# Patient Record
Sex: Female | Born: 1950
Health system: Southern US, Community
[De-identification: ages and names within clinical notes are randomized; demographics above are authoritative.]

## PROBLEM LIST (undated history)

## (undated) DIAGNOSIS — G8929 Other chronic pain: Secondary | ICD-10-CM

## (undated) DIAGNOSIS — R002 Palpitations: Secondary | ICD-10-CM

## (undated) DIAGNOSIS — M810 Age-related osteoporosis without current pathological fracture: Secondary | ICD-10-CM

## (undated) DIAGNOSIS — K5792 Diverticulitis of intestine, part unspecified, without perforation or abscess without bleeding: Secondary | ICD-10-CM

## (undated) DIAGNOSIS — I2510 Atherosclerotic heart disease of native coronary artery without angina pectoris: Secondary | ICD-10-CM

## (undated) DIAGNOSIS — M5412 Radiculopathy, cervical region: Secondary | ICD-10-CM

## (undated) DIAGNOSIS — I10 Essential (primary) hypertension: Secondary | ICD-10-CM

## (undated) DIAGNOSIS — M545 Low back pain, unspecified: Secondary | ICD-10-CM

## (undated) DIAGNOSIS — E785 Hyperlipidemia, unspecified: Secondary | ICD-10-CM

## (undated) DIAGNOSIS — E66812 Morbid (severe) obesity due to excess calories: Secondary | ICD-10-CM

## (undated) DIAGNOSIS — M1990 Unspecified osteoarthritis, unspecified site: Secondary | ICD-10-CM

## (undated) DIAGNOSIS — G44009 Cluster headache syndrome, unspecified, not intractable: Secondary | ICD-10-CM

## (undated) DIAGNOSIS — R519 Headache, unspecified: Secondary | ICD-10-CM

## (undated) HISTORY — PX: TONSILLECTOMY: SUR1361

## (undated) HISTORY — PX: COLOSTOMY: SHX63

## (undated) HISTORY — DX: Headache, unspecified: R51.9

## (undated) HISTORY — DX: Age-related osteoporosis without current pathological fracture: M81.0

## (undated) HISTORY — DX: Radiculopathy, cervical region: M54.12

## (undated) HISTORY — DX: Morbid (severe) obesity due to excess calories: E66.01

## (undated) HISTORY — DX: Morbid (severe) obesity due to excess calories: E66.812

## (undated) HISTORY — PX: OOPHORECTOMY: SHX86

## (undated) HISTORY — DX: Other chronic pain: G89.29

## (undated) HISTORY — DX: Low back pain, unspecified: M54.50

## (undated) HISTORY — PX: CARDIAC CATHETERIZATION: SHX172

## (undated) HISTORY — PX: BACK SURGERY: SHX140

## (undated) HISTORY — DX: Cluster headache syndrome, unspecified, not intractable: G44.009

## (undated) HISTORY — DX: Palpitations: R00.2

## (undated) HISTORY — PX: CORONARY ANGIOPLASTY: SHX604

## (undated) HISTORY — DX: Hyperlipidemia, unspecified: E78.5

## (undated) HISTORY — PX: COLOSTOMY CLOSURE: SHX1381

## (undated) HISTORY — DX: Essential (primary) hypertension: I10

---

## 1984-08-30 HISTORY — PX: CHOLECYSTECTOMY: SHX55

## 1988-08-30 HISTORY — PX: ABDOMINAL HYSTERECTOMY: SHX81

## 2005-04-02 ENCOUNTER — Encounter: Admission: RE | Admit: 2005-04-02 | Discharge: 2005-04-02 | Payer: Self-pay | Admitting: Family Medicine

## 2005-05-05 ENCOUNTER — Encounter: Admission: RE | Admit: 2005-05-05 | Discharge: 2005-05-05 | Payer: Self-pay | Admitting: Family Medicine

## 2005-05-13 ENCOUNTER — Encounter: Admission: RE | Admit: 2005-05-13 | Discharge: 2005-05-13 | Payer: Self-pay | Admitting: General Surgery

## 2005-10-05 ENCOUNTER — Inpatient Hospital Stay (HOSPITAL_COMMUNITY): Admission: RE | Admit: 2005-10-05 | Discharge: 2005-10-13 | Payer: Self-pay | Admitting: General Surgery

## 2006-02-07 ENCOUNTER — Ambulatory Visit (HOSPITAL_COMMUNITY): Admission: RE | Admit: 2006-02-07 | Discharge: 2006-02-07 | Payer: Self-pay | Admitting: Surgery

## 2006-02-09 ENCOUNTER — Inpatient Hospital Stay (HOSPITAL_COMMUNITY): Admission: AD | Admit: 2006-02-09 | Discharge: 2006-02-15 | Payer: Self-pay | Admitting: General Surgery

## 2010-08-28 ENCOUNTER — Ambulatory Visit: Payer: Self-pay | Admitting: Family Medicine

## 2011-03-30 ENCOUNTER — Ambulatory Visit: Payer: Self-pay | Admitting: Family Medicine

## 2011-05-07 ENCOUNTER — Ambulatory Visit: Payer: Self-pay | Admitting: Family Medicine

## 2011-07-26 ENCOUNTER — Other Ambulatory Visit: Payer: Self-pay | Admitting: Neurosurgery

## 2011-08-26 ENCOUNTER — Encounter (HOSPITAL_COMMUNITY): Payer: Self-pay | Admitting: Pharmacy Technician

## 2011-09-03 ENCOUNTER — Encounter (HOSPITAL_COMMUNITY)
Admission: RE | Admit: 2011-09-03 | Discharge: 2011-09-03 | Disposition: A | Discharge status: Home / Self Care | Payer: PRIVATE HEALTH INSURANCE | Source: Ambulatory Visit | Attending: Neurosurgery | Admitting: Neurosurgery

## 2011-09-03 ENCOUNTER — Encounter (HOSPITAL_COMMUNITY): Payer: Self-pay

## 2011-09-03 HISTORY — DX: Unspecified osteoarthritis, unspecified site: M19.90

## 2011-09-03 HISTORY — DX: Diverticulitis of intestine, part unspecified, without perforation or abscess without bleeding: K57.92

## 2011-09-03 LAB — CBC
HCT: 40.9 % (ref 36.0–46.0)
Hemoglobin: 13.8 g/dL (ref 12.0–15.0)
MCH: 30.6 pg (ref 26.0–34.0)
MCHC: 33.7 g/dL (ref 30.0–36.0)
MCV: 90.7 fL (ref 78.0–100.0)
Platelets: 296 10*3/uL (ref 150–400)
RBC: 4.51 MIL/uL (ref 3.87–5.11)
RDW: 13.5 % (ref 11.5–15.5)
WBC: 7.5 10*3/uL (ref 4.0–10.5)

## 2011-09-03 LAB — TYPE AND SCREEN
ABO/RH(D): A POS
Antibody Screen: NEGATIVE

## 2011-09-03 LAB — SURGICAL PCR SCREEN
MRSA, PCR: NEGATIVE
Staphylococcus aureus: NEGATIVE

## 2011-09-03 NOTE — Pre-Procedure Instructions (Signed)
20 Barbara Fletcher  09/03/2011   Your procedure is scheduled on:  Jan. 10, 2013  Report to [facility] at [phone].  Call this number if you have problems the morning of surgery: [phone]   Remember:    Do not eat food:After Midnight.  May have clear liquids: up to 4 Hours before arrival.  Clear liquids include soda, tea, black coffee, apple or grape juice, broth.  Take these medicines the morning of surgery with A SIP OF WATER: none   Do not wear jewelry, make-up or nail polish.  Do not wear lotions, powders, or perfumes. You may wear deodorant.  Do not shave 48 hours prior to surgery.  Do not bring valuables to the hospital.  Contacts, dentures or bridgework may not be worn into surgery.  Leave suitcase in the car. After surgery it may be brought to your room.  For patients admitted to the hospital, checkout time is 11:00 AM the day of discharge.   Patients discharged the day of surgery will not be allowed to drive home.  Name and phone number of your driver: NA   Special Instructions: CHG Shower Use Special Wash: 1/2 bottle night before surgery and 1/2 bottle morning of surgery.   Please read over the following fact sheets that you were given: Pain Booklet, Blood Transfusion Information and Surgical Site Infection Prevention

## 2011-09-08 MED ORDER — CEFAZOLIN SODIUM-DEXTROSE 2-3 GM-% IV SOLR
2.0000 g | INTRAVENOUS | Status: AC
  Administered 2011-09-09: 2 g via INTRAVENOUS
  Filled 2011-09-08: qty 50

## 2011-09-09 ENCOUNTER — Inpatient Hospital Stay (HOSPITAL_COMMUNITY)
Admission: RE | Admit: 2011-09-09 | Discharge: 2011-09-13 | DRG: 460 | Disposition: A | Discharge status: Home / Self Care | Payer: PRIVATE HEALTH INSURANCE | Source: Ambulatory Visit | Attending: Neurosurgery | Admitting: Neurosurgery

## 2011-09-09 ENCOUNTER — Encounter (HOSPITAL_COMMUNITY): Payer: Self-pay | Admitting: Critical Care Medicine

## 2011-09-09 ENCOUNTER — Ambulatory Visit (HOSPITAL_COMMUNITY): Payer: PRIVATE HEALTH INSURANCE

## 2011-09-09 ENCOUNTER — Encounter (HOSPITAL_COMMUNITY)
Admission: RE | Disposition: A | Discharge status: Home / Self Care | Payer: Self-pay | Source: Ambulatory Visit | Attending: Neurosurgery

## 2011-09-09 ENCOUNTER — Ambulatory Visit (HOSPITAL_COMMUNITY): Payer: PRIVATE HEALTH INSURANCE | Admitting: Critical Care Medicine

## 2011-09-09 ENCOUNTER — Encounter (HOSPITAL_COMMUNITY): Payer: Self-pay | Admitting: Surgery

## 2011-09-09 DIAGNOSIS — M5137 Other intervertebral disc degeneration, lumbosacral region: Principal | ICD-10-CM | POA: Diagnosis present

## 2011-09-09 DIAGNOSIS — Z79899 Other long term (current) drug therapy: Secondary | ICD-10-CM

## 2011-09-09 DIAGNOSIS — Z23 Encounter for immunization: Secondary | ICD-10-CM

## 2011-09-09 DIAGNOSIS — M51379 Other intervertebral disc degeneration, lumbosacral region without mention of lumbar back pain or lower extremity pain: Principal | ICD-10-CM | POA: Diagnosis present

## 2011-09-09 DIAGNOSIS — M4316 Spondylolisthesis, lumbar region: Secondary | ICD-10-CM

## 2011-09-09 DIAGNOSIS — Q762 Congenital spondylolisthesis: Secondary | ICD-10-CM

## 2011-09-09 DIAGNOSIS — Z01812 Encounter for preprocedural laboratory examination: Secondary | ICD-10-CM

## 2011-09-09 DIAGNOSIS — M48062 Spinal stenosis, lumbar region with neurogenic claudication: Secondary | ICD-10-CM | POA: Diagnosis present

## 2011-09-09 SURGERY — POSTERIOR LUMBAR FUSION 2 LEVEL
Anesthesia: General | Site: Spine Lumbar | Wound class: Clean

## 2011-09-09 MED ORDER — SODIUM CHLORIDE 0.9 % IJ SOLN: 9.0000 mL | INTRAMUSCULAR | Status: DC | PRN

## 2011-09-09 MED ORDER — EPHEDRINE SULFATE 50 MG/ML IJ SOLN
INTRAMUSCULAR | Status: DC | PRN
  Administered 2011-09-09: 10 mg via INTRAVENOUS

## 2011-09-09 MED ORDER — PROPOFOL 10 MG/ML IV EMUL
INTRAVENOUS | Status: DC | PRN
  Administered 2011-09-09: 180 mg via INTRAVENOUS

## 2011-09-09 MED ORDER — NEOSTIGMINE METHYLSULFATE 1 MG/ML IJ SOLN
INTRAMUSCULAR | Status: DC | PRN
  Administered 2011-09-09: 4 mg via INTRAVENOUS

## 2011-09-09 MED ORDER — MORPHINE SULFATE (PF) 1 MG/ML IV SOLN
INTRAVENOUS | Status: DC
  Administered 2011-09-09 – 2011-09-10 (×2): via INTRAVENOUS
  Administered 2011-09-10: 10.07 mg via INTRAVENOUS
  Administered 2011-09-10: 12 mg via INTRAVENOUS
  Administered 2011-09-10: 7.5 mg via INTRAVENOUS
  Administered 2011-09-10: 14.37 mg via INTRAVENOUS
  Administered 2011-09-10: 13.5 mg via INTRAVENOUS
  Administered 2011-09-10: 16.5 mg via INTRAVENOUS
  Administered 2011-09-11: 8.26 mg via INTRAVENOUS
  Administered 2011-09-11: 19.5 mg via INTRAVENOUS
  Administered 2011-09-11: 02:00:00 via INTRAVENOUS
  Filled 2011-09-09 (×5): qty 25

## 2011-09-09 MED ORDER — MENTHOL 3 MG MT LOZG: 1.0000 | LOZENGE | OROMUCOSAL | Status: DC | PRN

## 2011-09-09 MED ORDER — ONDANSETRON HCL 4 MG/2ML IJ SOLN: 4.0000 mg | Freq: Four times a day (QID) | INTRAMUSCULAR | Status: DC | PRN

## 2011-09-09 MED ORDER — HYDROMORPHONE HCL PF 1 MG/ML IJ SOLN
0.2500 mg | INTRAMUSCULAR | Status: DC | PRN
  Administered 2011-09-09 (×2): 0.5 mg via INTRAVENOUS

## 2011-09-09 MED ORDER — GLYCOPYRROLATE 0.2 MG/ML IJ SOLN
INTRAMUSCULAR | Status: DC | PRN
  Administered 2011-09-09: .6 mg via INTRAVENOUS

## 2011-09-09 MED ORDER — HYDROCODONE-ACETAMINOPHEN 5-325 MG PO TABS
ORAL_TABLET | ORAL | Status: AC
  Filled 2011-09-09: qty 2

## 2011-09-09 MED ORDER — THROMBIN 20000 UNITS EX KIT
PACK | CUTANEOUS | Status: DC | PRN
  Administered 2011-09-09 (×2): 20000 [IU] via TOPICAL

## 2011-09-09 MED ORDER — LACTATED RINGERS IV SOLN: INTRAVENOUS | Status: DC

## 2011-09-09 MED ORDER — DOCUSATE SODIUM 100 MG PO CAPS
100.0000 mg | ORAL_CAPSULE | Freq: Two times a day (BID) | ORAL | Status: DC
  Administered 2011-09-09 – 2011-09-12 (×7): 100 mg via ORAL
  Filled 2011-09-09 (×5): qty 1

## 2011-09-09 MED ORDER — HETASTARCH-ELECTROLYTES 6 % IV SOLN
INTRAVENOUS | Status: DC | PRN
  Administered 2011-09-09: 14:00:00 via INTRAVENOUS

## 2011-09-09 MED ORDER — PHENOL 1.4 % MT LIQD: 1.0000 | OROMUCOSAL | Status: DC | PRN

## 2011-09-09 MED ORDER — OXYCODONE-ACETAMINOPHEN 5-325 MG PO TABS
1.0000 | ORAL_TABLET | ORAL | Status: DC | PRN
  Administered 2011-09-11 – 2011-09-13 (×11): 2 via ORAL
  Filled 2011-09-09 (×11): qty 2

## 2011-09-09 MED ORDER — PHENYLEPHRINE HCL 10 MG/ML IJ SOLN
10.0000 mg | INTRAVENOUS | Status: DC | PRN
  Administered 2011-09-09: 10 ug/min via INTRAVENOUS

## 2011-09-09 MED ORDER — SODIUM CHLORIDE 0.9 % IR SOLN
Status: DC | PRN
  Administered 2011-09-09: 14:00:00

## 2011-09-09 MED ORDER — LORATADINE 10 MG PO TABS
10.0000 mg | ORAL_TABLET | Freq: Every day | ORAL | Status: DC
  Administered 2011-09-11: 10 mg via ORAL
  Filled 2011-09-09 (×4): qty 1

## 2011-09-09 MED ORDER — DIAZEPAM 5 MG PO TABS
ORAL_TABLET | ORAL | Status: AC
  Filled 2011-09-09: qty 1

## 2011-09-09 MED ORDER — BACITRACIN ZINC 500 UNIT/GM EX OINT
TOPICAL_OINTMENT | CUTANEOUS | Status: DC | PRN
  Administered 2011-09-09: 1 via TOPICAL

## 2011-09-09 MED ORDER — DEXAMETHASONE SODIUM PHOSPHATE 4 MG/ML IJ SOLN
INTRAMUSCULAR | Status: DC | PRN
  Administered 2011-09-09: 4 mg via INTRAVENOUS

## 2011-09-09 MED ORDER — LACTATED RINGERS IV SOLN
INTRAVENOUS | Status: DC
  Administered 2011-09-09: 22:00:00 via INTRAVENOUS
  Administered 2011-09-10: 75 mL/h via INTRAVENOUS

## 2011-09-09 MED ORDER — DIPHENHYDRAMINE HCL 12.5 MG/5ML PO ELIX: 12.5000 mg | ORAL_SOLUTION | Freq: Four times a day (QID) | ORAL | Status: DC | PRN

## 2011-09-09 MED ORDER — LACTATED RINGERS IV SOLN
INTRAVENOUS | Status: DC | PRN
  Administered 2011-09-09 (×4): via INTRAVENOUS

## 2011-09-09 MED ORDER — ONDANSETRON HCL 4 MG/2ML IJ SOLN
INTRAMUSCULAR | Status: DC | PRN
  Administered 2011-09-09 (×2): 4 mg via INTRAVENOUS

## 2011-09-09 MED ORDER — BACITRACIN 50000 UNITS IM SOLR
INTRAMUSCULAR | Status: AC
  Filled 2011-09-09: qty 50000

## 2011-09-09 MED ORDER — HEMOSTATIC AGENTS (NO CHARGE) OPTIME
TOPICAL | Status: DC | PRN
  Administered 2011-09-09 (×2): 1 via TOPICAL

## 2011-09-09 MED ORDER — BUPIVACAINE LIPOSOME 1.3 % IJ SUSP
20.0000 mL | INTRAMUSCULAR | Status: AC
  Administered 2011-09-09: 20 mL
  Filled 2011-09-09: qty 20

## 2011-09-09 MED ORDER — ZOLPIDEM TARTRATE 10 MG PO TABS: 10.0000 mg | ORAL_TABLET | Freq: Every evening | ORAL | Status: DC | PRN

## 2011-09-09 MED ORDER — CEFAZOLIN SODIUM 1-5 GM-% IV SOLN
1.0000 g | Freq: Three times a day (TID) | INTRAVENOUS | Status: AC
  Administered 2011-09-09 – 2011-09-10 (×2): 1 g via INTRAVENOUS
  Filled 2011-09-09 (×3): qty 50

## 2011-09-09 MED ORDER — MIDAZOLAM HCL 5 MG/5ML IJ SOLN
INTRAMUSCULAR | Status: DC | PRN
  Administered 2011-09-09: 2 mg via INTRAVENOUS

## 2011-09-09 MED ORDER — ACETAMINOPHEN 325 MG PO TABS: 650.0000 mg | ORAL_TABLET | ORAL | Status: DC | PRN

## 2011-09-09 MED ORDER — VECURONIUM BROMIDE 10 MG IV SOLR
INTRAVENOUS | Status: DC | PRN
  Administered 2011-09-09 (×3): 2 mg via INTRAVENOUS
  Administered 2011-09-09: 3 mg via INTRAVENOUS

## 2011-09-09 MED ORDER — NALOXONE HCL 0.4 MG/ML IJ SOLN: 0.4000 mg | INTRAMUSCULAR | Status: DC | PRN

## 2011-09-09 MED ORDER — PROMETHAZINE HCL 25 MG/ML IJ SOLN: 6.2500 mg | INTRAMUSCULAR | Status: DC | PRN

## 2011-09-09 MED ORDER — ONDANSETRON HCL 4 MG/2ML IJ SOLN: 4.0000 mg | INTRAMUSCULAR | Status: DC | PRN

## 2011-09-09 MED ORDER — ACETAMINOPHEN 650 MG RE SUPP: 650.0000 mg | RECTAL | Status: DC | PRN

## 2011-09-09 MED ORDER — DIPHENHYDRAMINE HCL 50 MG/ML IJ SOLN: 12.5000 mg | Freq: Four times a day (QID) | INTRAMUSCULAR | Status: DC | PRN

## 2011-09-09 MED ORDER — HYDROCODONE-ACETAMINOPHEN 5-325 MG PO TABS
1.0000 | ORAL_TABLET | ORAL | Status: DC | PRN
  Administered 2011-09-09: 2 via ORAL

## 2011-09-09 MED ORDER — SODIUM CHLORIDE 0.9 % IV SOLN
INTRAVENOUS | Status: AC
  Filled 2011-09-09: qty 500

## 2011-09-09 MED ORDER — ROCURONIUM BROMIDE 100 MG/10ML IV SOLN
INTRAVENOUS | Status: DC | PRN
Start: 2011-09-09 — End: 2011-09-09
  Administered 2011-09-09: 50 mg via INTRAVENOUS

## 2011-09-09 MED ORDER — BUPIVACAINE-EPINEPHRINE PF 0.5-1:200000 % IJ SOLN
INTRAMUSCULAR | Status: DC | PRN
  Administered 2011-09-09: 10 mL

## 2011-09-09 MED ORDER — HYDROMORPHONE HCL PF 1 MG/ML IJ SOLN
INTRAMUSCULAR | Status: AC
  Filled 2011-09-09: qty 1

## 2011-09-09 MED ORDER — DIAZEPAM 5 MG PO TABS
5.0000 mg | ORAL_TABLET | Freq: Four times a day (QID) | ORAL | Status: DC | PRN
  Administered 2011-09-09: 5 mg via ORAL

## 2011-09-09 MED ORDER — 0.9 % SODIUM CHLORIDE (POUR BTL) OPTIME
TOPICAL | Status: DC | PRN
  Administered 2011-09-09: 1000 mL

## 2011-09-09 MED ORDER — FENTANYL CITRATE 0.05 MG/ML IJ SOLN
INTRAMUSCULAR | Status: DC | PRN
  Administered 2011-09-09 (×3): 50 ug via INTRAVENOUS
  Administered 2011-09-09: 25 ug via INTRAVENOUS
  Administered 2011-09-09: 150 ug via INTRAVENOUS
  Administered 2011-09-09: 50 ug via INTRAVENOUS
  Administered 2011-09-09: 100 ug via INTRAVENOUS
  Administered 2011-09-09: 50 ug via INTRAVENOUS

## 2011-09-09 MED ORDER — ONDANSETRON HCL 4 MG/2ML IJ SOLN
4.0000 mg | INTRAMUSCULAR | Status: DC | PRN
  Administered 2011-09-09: 4 mg via INTRAVENOUS
  Filled 2011-09-09: qty 2

## 2011-09-09 SURGICAL SUPPLY — 76 items
BAG DECANTER FOR FLEXI CONT (MISCELLANEOUS) ×2 IMPLANT
BENZOIN TINCTURE PRP APPL 2/3 (GAUZE/BANDAGES/DRESSINGS) IMPLANT
BLADE SURG 15 STRL LF DISP TIS (BLADE) ×1 IMPLANT
BLADE SURG 15 STRL SS (BLADE) ×1
BLADE SURG ROTATE 9660 (MISCELLANEOUS) IMPLANT
BRUSH SCRUB EZ PLAIN DRY (MISCELLANEOUS) ×2 IMPLANT
BUR ACORN 6.0 (BURR) ×2 IMPLANT
BUR MATCHSTICK NEURO 3.0 LAGG (BURR) ×2 IMPLANT
CANISTER SUCTION 2500CC (MISCELLANEOUS) ×2 IMPLANT
CLOSURE STERI STRIP 1/2 X4 (GAUZE/BANDAGES/DRESSINGS) ×2 IMPLANT
CLOTH BEACON ORANGE TIMEOUT ST (SAFETY) ×2 IMPLANT
CONN CROSSLINK REV 6.35 48-60 (Connector) ×2 IMPLANT
CONNECTOR CRSLNK REV6.35 48-60 (Connector) ×1 IMPLANT
CONT SPEC 4OZ CLIKSEAL STRL BL (MISCELLANEOUS) ×4 IMPLANT
COVER BACK TABLE 24X17X13 BIG (DRAPES) IMPLANT
DRAPE C-ARM 42X72 X-RAY (DRAPES) ×4 IMPLANT
DRAPE LAPAROTOMY 100X72X124 (DRAPES) ×2 IMPLANT
DRAPE POUCH INSTRU U-SHP 10X18 (DRAPES) ×2 IMPLANT
DRAPE SURG 17X23 STRL (DRAPES) ×8 IMPLANT
ELECT BLADE 4.0 EZ CLEAN MEGAD (MISCELLANEOUS) ×2
ELECT REM PT RETURN 9FT ADLT (ELECTROSURGICAL) ×2
ELECTRODE BLDE 4.0 EZ CLN MEGD (MISCELLANEOUS) ×1 IMPLANT
ELECTRODE REM PT RTRN 9FT ADLT (ELECTROSURGICAL) ×1 IMPLANT
GAUZE SPONGE 4X4 16PLY XRAY LF (GAUZE/BANDAGES/DRESSINGS) ×2 IMPLANT
GLOVE BIO SURGEON STRL SZ 6.5 (GLOVE) ×6 IMPLANT
GLOVE BIO SURGEON STRL SZ8.5 (GLOVE) ×4 IMPLANT
GLOVE BIOGEL PI IND STRL 6.5 (GLOVE) ×1 IMPLANT
GLOVE BIOGEL PI IND STRL 7.0 (GLOVE) ×1 IMPLANT
GLOVE BIOGEL PI IND STRL 7.5 (GLOVE) ×1 IMPLANT
GLOVE BIOGEL PI INDICATOR 6.5 (GLOVE) ×1
GLOVE BIOGEL PI INDICATOR 7.0 (GLOVE) ×1
GLOVE BIOGEL PI INDICATOR 7.5 (GLOVE) ×1
GLOVE ECLIPSE 7.5 STRL STRAW (GLOVE) ×2 IMPLANT
GLOVE EXAM NITRILE LRG STRL (GLOVE) IMPLANT
GLOVE EXAM NITRILE MD LF STRL (GLOVE) ×4 IMPLANT
GLOVE EXAM NITRILE XL STR (GLOVE) IMPLANT
GLOVE EXAM NITRILE XS STR PU (GLOVE) IMPLANT
GLOVE SS BIOGEL STRL SZ 8 (GLOVE) ×2 IMPLANT
GLOVE SUPERSENSE BIOGEL SZ 8 (GLOVE) ×2
GLOVE SURG SS PI 6.5 STRL IVOR (GLOVE) ×4 IMPLANT
GOWN BRE IMP SLV AUR LG STRL (GOWN DISPOSABLE) ×4 IMPLANT
GOWN BRE IMP SLV AUR XL STRL (GOWN DISPOSABLE) ×4 IMPLANT
GOWN STRL REIN 2XL LVL4 (GOWN DISPOSABLE) IMPLANT
Globus Revere Locking Cap (Cap) ×12 IMPLANT
KIT BASIN OR (CUSTOM PROCEDURE TRAY) ×2 IMPLANT
KIT ROOM TURNOVER OR (KITS) ×2 IMPLANT
NEEDLE HYPO 21X1.5 SAFETY (NEEDLE) ×2 IMPLANT
NEEDLE HYPO 22GX1.5 SAFETY (NEEDLE) ×2 IMPLANT
NS IRRIG 1000ML POUR BTL (IV SOLUTION) ×2 IMPLANT
PACK FOAM VITOSS 10CC (Orthopedic Implant) ×2 IMPLANT
PACK LAMINECTOMY NEURO (CUSTOM PROCEDURE TRAY) ×2 IMPLANT
PAD ARMBOARD 7.5X6 YLW CONV (MISCELLANEOUS) ×10 IMPLANT
PATTIES SURGICAL .5 X.5 (GAUZE/BANDAGES/DRESSINGS) ×2 IMPLANT
PATTIES SURGICAL .5 X1 (DISPOSABLE) IMPLANT
PATTIES SURGICAL 1X1 (DISPOSABLE) ×2 IMPLANT
PUTTY ABX ACTIFUSE 20ML (Putty) ×2 IMPLANT
ROD REVERE 7.5MM (Rod) ×4 IMPLANT
SCREW REVERE 6.5X50MM (Screw) ×12 IMPLANT
SPACER SUSTAIN O SML 8X22 11MM (Peek) ×4 IMPLANT
SPACER SUSTAIN O SML 8X22 9MM (Peek) ×4 IMPLANT
SPONGE GAUZE 4X4 12PLY (GAUZE/BANDAGES/DRESSINGS) IMPLANT
SPONGE LAP 4X18 X RAY DECT (DISPOSABLE) ×2 IMPLANT
SPONGE NEURO XRAY DETECT 1X3 (DISPOSABLE) IMPLANT
SPONGE SURGIFOAM ABS GEL 100 (HEMOSTASIS) ×2 IMPLANT
STRIP CLOSURE SKIN 1/2X4 (GAUZE/BANDAGES/DRESSINGS) IMPLANT
SUT PROLENE 6 0 BV (SUTURE) ×2 IMPLANT
SUT VIC AB 1 CT1 18XBRD ANBCTR (SUTURE) ×2 IMPLANT
SUT VIC AB 1 CT1 8-18 (SUTURE) ×2
SUT VIC AB 2-0 CP2 18 (SUTURE) ×4 IMPLANT
SYR 20CC LL (SYRINGE) ×2 IMPLANT
SYR 20ML ECCENTRIC (SYRINGE) ×2 IMPLANT
TAPE CLOTH SURG 4X10 WHT LF (GAUZE/BANDAGES/DRESSINGS) ×6 IMPLANT
TOWEL OR 17X24 6PK STRL BLUE (TOWEL DISPOSABLE) ×2 IMPLANT
TOWEL OR 17X26 10 PK STRL BLUE (TOWEL DISPOSABLE) ×2 IMPLANT
TRAY FOLEY CATH 14FRSI W/METER (CATHETERS) ×2 IMPLANT
WATER STERILE IRR 1000ML POUR (IV SOLUTION) ×2 IMPLANT

## 2011-09-09 NOTE — Anesthesia Procedure Notes (Signed)
Procedure Name: Intubation Date/Time: 09/09/2011 12:27 PM Performed by: Lanell Matar Pre-anesthesia Checklist: Patient identified, Timeout performed, Emergency Drugs available, Suction available and Patient being monitored Patient Re-evaluated:Patient Re-evaluated prior to inductionOxygen Delivery Method: Circle System Utilized Preoxygenation: Pre-oxygenation with 100% oxygen Intubation Type: IV induction and Cricoid Pressure applied Ventilation: Mask ventilation without difficulty Laryngoscope Size: Mac and 3 Grade View: Grade I Tube type: Oral Tube size: 7.0 mm Number of attempts: 1 Airway Equipment and Method: stylet Placement Confirmation: ETT inserted through vocal cords under direct vision,  breath sounds checked- equal and bilateral and positive ETCO2 Secured at: 21 cm Tube secured with: Tape Dental Injury: Teeth and Oropharynx as per pre-operative assessment

## 2011-09-09 NOTE — Anesthesia Postprocedure Evaluation (Signed)
Anesthesia Post Note  Patient: Barbara Fletcher  Procedure(s) Performed:  POSTERIOR LUMBAR FUSION 2 LEVEL - Lumbar three-four,Lumbar four-five laminectomy with posterior lumbar interbody fusion with interbody prothesis posterolateral arthrodesis and posterior segmental instrumentation  Anesthesia type: General  Patient location: PACU  Post pain: Pain level controlled and Adequate analgesia  Post assessment: Post-op Vital signs reviewed, Patient's Cardiovascular Status Stable, Respiratory Function Stable, Patent Airway and Pain level controlled  Last Vitals:  Filed Vitals:   09/09/11 0959  BP: 147/81  Pulse: 82  Temp: 36.6 C  Resp: 18    Post vital signs: Reviewed and stable  Level of consciousness: awake, alert  and oriented  Complications: No apparent anesthesia complications

## 2011-09-09 NOTE — OR Nursing (Signed)
Spoke with pharmacy/ [id number]/ pca not ready d/t delays/ pain is easily managed at presnt with po's and injectables as pt states is now 3-4/10

## 2011-09-09 NOTE — Preoperative (Signed)
Beta Blockers   Reason not to administer Beta Blockers:Not Applicable 

## 2011-09-09 NOTE — H&P (Signed)
Subjective: Patient is a 61 year old white female who has had years of chronic back buttocks and leg pain. He has failed medical management . She was worked up with a lumbar MRI which demonstrated she had a spondylolisthesis and spinal stenosis at L3-4 and L4-5. Discussed the various treatment options with the patient including surgery. Patient has decided proceed with surgery.  Past Medical History  Diagnosis Date  . Arthritis   . Diverticulitis     Past Surgical History  Procedure Date  . Tonsillectomy     1958  . Oophorectomy     1981  . Abdominal hysterectomy 1990  . Cesarean section 1985, 1988  . Cholecystectomy 1986  . Colostomy   . Colostomy closure     abd diverticulitis with colostomy, reversal 2007- 2008    Allergies  Allergen Reactions  . Levsin (Hyoscyamine Sulfate) Hives and Shortness Of Breath    History  Substance Use Topics  . Smoking status: Never Smoker   . Smokeless tobacco: Not on file  . Alcohol Use: 0.6 oz/week    1 Glasses of wine per week    History reviewed. No pertinent family history. Prior to Admission medications   Medication Sig Start Date End Date Taking? Authorizing Provider  fexofenadine-pseudoephedrine (ALLEGRA-D 24) 180-240 MG per 24 hr tablet Take 1 tablet by mouth daily as needed. Seasonal allergies    Yes Historical Provider, MD  ibuprofen (ADVIL,MOTRIN) 200 MG tablet Take 800 mg by mouth 2 (two) times daily.     Yes Historical Provider, MD     Review of Systems  Positive ROS: Negative except as above  All other systems have been reviewed and were otherwise negative with the exception of those mentioned in the HPI and as above.  Objective: Vital signs in last 24 hours: Temp:  [97.9 F (36.6 C)] 97.9 F (36.6 C) (01/10 0959) Pulse Rate:  [82] 82  (01/10 0959) Resp:  [18] 18  (01/10 0959) BP: (147)/(81) 147/81 mmHg (01/10 0959) SpO2:  [99 %] 99 % (01/10 0959)  General Appearance: Alert, cooperative, no distress, appears  stated age Head: Normocephalic, without obvious abnormality, atraumatic Eyes: PERRL, conjunctiva/corneas clear, EOM's intact, fundi benign, both eyes      Ears: Normal TM's and external ear canals, both ears Throat: Lips, mucosa, and tongue normal; teeth and gums normal Neck: Supple, symmetrical, trachea midline, no adenopathy; thyroid: No enlargement/tenderness/nodules; no carotid bruit or JVD Back: Symmetric, no curvature, ROM normal, no CVA tenderness Lungs: Clear to auscultation bilaterally, respirations unlabored Heart: Regular rate and rhythm, S1 and S2 normal, no murmur, rub or gallop Abdomen: Soft, non-tender, bowel sounds active all four quadrants, no masses, no organomegaly Extremities: Extremities normal, atraumatic, no cyanosis or edema Pulses: 2+ and symmetric all extremities Skin: Skin color, texture, turgor normal, no rashes or lesions  NEUROLOGIC:   Mental status: alert and oriented, no aphasia, good attention span, Fund of knowledge/ memory ok Motor Exam - grossly normal Sensory Exam - grossly normal Reflexes:  Coordination - grossly normal Gait - grossly normal Balance - grossly normal Cranial Nerves: I: smell Not tested  II: visual acuity  OS: Normal    OD: Normal   II: visual fields Full to confrontation  II: pupils Equal, round, reactive to light  III,VII: ptosis None  III,IV,VI: extraocular muscles  Full ROM  V: mastication Normal  V: facial light touch sensation  Normal  V,VII: corneal reflex  Present  VII: facial muscle function - upper  Normal  VII: facial  muscle function - lower Normal  VIII: hearing Not tested  IX: soft palate elevation  Normal  IX,X: gag reflex Present  XI: trapezius strength  5/5  XI: sternocleidomastoid strength 5/5  XI: neck flexion strength  5/5  XII: tongue strength  Normal    Data Review Lab Results  Component Value Date   WBC 7.5 09/03/2011   HGB 13.8 09/03/2011   HCT 40.9 09/03/2011   MCV 90.7 09/03/2011   PLT 296  09/03/2011   No results found for this basename: NA, K, CL, CO2, BUN, creatinine, glucose   No results found for this basename: INR, PROTIME   Imaging studies: I reviewed the patient's lumbar MRI performed at [facility] on 05/07/2011. It demonstrates the patient has spondylolisthesis and spinal stenosis at L3-4 and L4-5 as well some diffuse degenerative changes.  Assessment/Plan: L3-4 and L4-5 spinal listhesis, degenerative disease, spinal stenosis, lumbago, lumbar radiculopathy, neurogenic claudication: I discussed the situation with the patient and her husband. I reviewed the MR scan with them. I've pointed out the abnormalities. Have discussed the various treatment options including surgery. I described the surgical option of an L3-4 and L4-5 decompression, instrumentation and fusion. I've shown her surgical models. I discussed the risks, benefits, alternatives and likelihood of achieving our goals with surgery. The patient has decided proceed with surgery.   Jarelis Ehlert D 09/09/2011 12:01 PM

## 2011-09-09 NOTE — Progress Notes (Signed)
Subjective:  A she is mildly somnolent but easily arousable. She looks well and appears comfortable.  Objective: Vital signs in last 24 hours: Temp:  [97.9 F (36.6 C)] 97.9 F (36.6 C) (01/10 0959) Pulse Rate:  [82] 82  (01/10 0959) Resp:  [18] 18  (01/10 0959) BP: (147)/(81) 147/81 mmHg (01/10 0959) SpO2:  [99 %] 99 % (01/10 0959)  Intake/Output from previous day:   Intake/Output this shift: Total I/O In: 3680 [I.V.:3000; Blood:180; IV Piggyback:500] Out: 1025 [Urine:425; Blood:600]  Physical exam patient is Glasgow Coma Scale 13. She is moving all 4 extremities well. Her dressing is clean and dry.  Lab Results: No results found for this basename: WBC:2,HGB:2,HCT:2,PLT:2 in the last 72 hours BMET No results found for this basename: NA:2,K:2,CL:2,CO2:2,GLUCOSE:2,BUN:2,CREATININE:2,CALCIUM:2 in the last 72 hours  Studies/Results: Dg Lumbar Spine 2-3 Views  09/09/2011  *RADIOLOGY REPORT*  Clinical Data: Back pain  LUMBAR SPINE - 2-3 VIEW  Comparison: Intraoperative films.  Findings: C-arm films document L3-L5 PLIF.  No gross adverse features.  IMPRESSION: As above.  Original Report Authenticated By: Elsie Stain, M.D.   Dg Lumbar Spine 1 View  09/09/2011  *RADIOLOGY REPORT*  Clinical Data: L3-5 PLIF.  LUMBAR SPINE - 1 VIEW  Comparison: None.  Findings: A single intraoperative cross-table lateral view of the lumbar spine, taken at 1320 hours, is submitted.  Surgical instrument tip projects in the region of the L3-4 facet joints, where there is grade 1 anterolisthesis.  Osseous detail is degraded by technique.  IMPRESSION: Intraoperative visualization for L3-5 PLIF.  Original Report Authenticated By: Reyes Ivan, M.D.    Assessment/Plan: Patient is doing well.  LOS: 0 days     Tyiesha Brackney D 09/09/2011, 6:02 PM

## 2011-09-09 NOTE — Transfer of Care (Signed)
Immediate Anesthesia Transfer of Care Note  Patient: Barbara Fletcher  Procedure(s) Performed:  POSTERIOR LUMBAR FUSION 2 LEVEL - Lumbar three-four,Lumbar four-five laminectomy with posterior lumbar interbody fusion with interbody prothesis posterolateral arthrodesis and posterior segmental instrumentation  Patient Location: PACU  Anesthesia Type: General  Level of Consciousness: awake, alert  and patient cooperative  Airway & Oxygen Therapy: Patient Spontanous Breathing and Patient connected to nasal cannula oxygen  Post-op Assessment: Report given to PACU RN, Post -op Vital signs reviewed and stable and Patient moving all extremities X 4  Post vital signs: Reviewed and stable Filed Vitals:   09/09/11 0959  BP: 147/81  Pulse: 82  Temp: 36.6 C  Resp: 18    Complications: No apparent anesthesia complications

## 2011-09-09 NOTE — Anesthesia Preprocedure Evaluation (Signed)
Anesthesia Evaluation  Patient identified by MRN, date of birth, ID band Patient awake    Reviewed: Allergy & Precautions, H&P , NPO status , Patient's Chart, lab work & pertinent test results  Airway Mallampati: II TM Distance: >3 FB Neck ROM: Full    Dental No notable dental hx. (+) Teeth Intact and Dental Advisory Given   Pulmonary neg pulmonary ROS,  clear to auscultation  Pulmonary exam normal       Cardiovascular neg cardio ROS Regular Normal    Neuro/Psych Chronic back pain- LLeg numbness Negative Neurological ROS     GI/Hepatic negative GI ROS, Neg liver ROS,   Endo/Other  Negative Endocrine ROSMorbid obesity  Renal/GU negative Renal ROS     Musculoskeletal   Abdominal (+) obese,   Peds  Hematology   Anesthesia Other Findings   Reproductive/Obstetrics                           Anesthesia Physical Anesthesia Plan  ASA: II  Anesthesia Plan: General   Post-op Pain Management:    Induction: Intravenous  Airway Management Planned: Oral ETT  Additional Equipment:   Intra-op Plan:   Post-operative Plan: Extubation in OR  Informed Consent: I have reviewed the patients History and Physical, chart, labs and discussed the procedure including the risks, benefits and alternatives for the proposed anesthesia with the patient or authorized representative who has indicated his/her understanding and acceptance.   Dental advisory given  Plan Discussed with: CRNA and Surgeon  Anesthesia Plan Comments: (Plan routine monitors, GETA)        Anesthesia Quick Evaluation

## 2011-09-09 NOTE — Op Note (Signed)
Brief history: She is a 61 year old white female who has suffered from back buttocks and leg pain consistent with neurogenic claudication. She has failed medical management and was worked up with a lumbar MRI. This demonstrated the patient has spondylolisthesis and severe stenosis at L3-4 and L4-5. I discussed the various treatment options with the patient including surgery. The patient has decided proceed with surgery after weighing the risks benefits and alternatives.  Preoperative diagnosis: L3-4 and L4-5 Degenerative disc disease, spinal stenosis; lumbago; lumbar radiculopathy, spondylolisthesis   Postoperative diagnosis: L3-4 and L4-5 spondylolisthesis, Degenerative disc disease, spinal stenosis; lumbago; lumbar radiculopathy  Procedure: Bilateral L3-4 and L4-5 Laminotomy/foraminotomies to decompress the bilateral L3, L4 and L5 nerve roots(the work required to do this was in addition to the work required to do the posterior lumbar interbody fusion because of the patient's spinal stenosis, facet arthropathy. Etc. requiring a wide decompression of the nerve roots.); L3-4 and L4-5 posterior lumbar interbody fusion with local morselized autograft bone and Actifusebone graft extender; insertion of interbody prosthesis at L3-4 and L4-5 (globus peek interbody prosthesis); posterior segmental instrumentation from L3 to L5 with globus titanium pedicle screws and rods; posterior lateral arthrodesis at L3-4 and L4-5 with local morselized autograft bone and Vitoss bone graft extender.  Surgeon: Dr. Delma Officer  Asst.: Dr. Colon Branch  Anesthesia: Gen. endotracheal  Estimated blood loss: 500 cc  Drains: None  Locations: None  Description of procedure: The patient was brought to the operating room by the anesthesia team. General endotracheal anesthesia was induced. The patient was turned to the prone position on the Wilson frame. The patient's lumbosacral region was then prepared with Betadine scrub and  Betadine solution. Sterile drapes were applied.  I then injected the area to be incised with Marcaine with epinephrine solution. I then used the scalpel to make a linear midline incision over the L3-4 and L4-5 interspace. I then used electrocautery to perform a bilateral subperiosteal dissection exposing the spinous process and lamina of L2-L5. We then obtained intraoperative radiograph to confirm our location. We then inserted the Verstrac retractor to provide exposure.  I began the decompression by using the high speed drill to perform laminotomies at L3-4 and L4-5. We then used the Kerrison punches to widen the laminotomy and removed the ligamentum flavum at L3-4 and L4-5. We used the Kerrison punches to remove the medial facets at L3-4 and L4-5. We performed wide foraminotomies about the bilateral L3, L4 and L5 nerve roots completing the decompression.  We now turned our attention to the posterior lumbar interbody fusion. I used a scalpel to incise the intervertebral disc at L3-4 and L4-5. I then performed a partial intervertebral discectomy at L3-4 and L4-5 using the pituitary forceps. We prepared the vertebral endplates at L3-4 and L4-5 for the fusion by removing the soft tissues with the curettes. We then used the trial spacers to pick the appropriate sized interbody prosthesis. We prefilled his prosthesis with a combination of local morselized autograft bone that we obtained during the decompression as well as Actifuse bone graft extender. We inserted the prefilled prosthesis into the interspace at L3-4 and L4-5. There was a good snug fit of the prosthesis in the interspace. We then filled and the remainder of the intervertebral disc space with local morselized autograft bone and Actifuse. This completed the posterior lumbar interbody arthrodesis.  We now turned attention to the instrumentation. Under fluoroscopic guidance we cannulated the bilateral L3, L4 and L5 pedicles with the bone probe. We  then  removed the bone probe. He then tapped the pedicle with a 5.5 millimeter tap. We then removed the tap. We probed inside the tapped pedicle with a ball probe to rule out cortical breaches. We then inserted a 6.5 x 50 millimeter pedicle screw into the L3, L4 and L5 pedicles bilaterally under fluoroscopic guidance. We then palpated along the medial aspect of the pedicles to rule out cortical breaches. There were none. The nerve roots were not injured. We then connected the unilateral pedicle screws with a lordotic rod. We compressed the construct and secured the rod in place with the caps. We then tightened the caps appropriately. This completed the instrumentation from L3-L5.  We now turned our attention to the posterior lateral arthrodesis at L3-4 and L4-5. We used the high-speed drill to decorticate the remainder of the facets, pars, transverse process at 34 and L4-5. We then applied a combination of local morselized autograft bone and Vitoss bone graft extender over these decorticated posterior lateral structures. This completed the posterior lateral arthrodesis.  We then obtained hemostasis using bipolar electrocautery. We irrigated the wound out with bacitracin solution. We inspected the thecal sac and nerve roots and noted they were well decompressed. We then removed the retractor. We reapproximated patient's thoracolumbar fascia with interrupted #1 Vicryl suture. We reapproximated patient's subcutaneous tissue with interrupted 2-0 Vicryl suture. The reapproximated patient's skin with Steri-Strips and benzoin. The wound was then coated with bacitracin ointment. A sterile dressing was applied. The drapes were removed. The patient was subsequently returned to the supine position where they were extubated by the anesthesia team. He was then transported to the post anesthesia care unit in stable condition. All sponge instrument and needle counts were correct at the end of this case.

## 2011-09-10 LAB — BASIC METABOLIC PANEL
BUN: 10 mg/dL (ref 6–23)
CO2: 26 mEq/L (ref 19–32)
Calcium: 8.4 mg/dL (ref 8.4–10.5)
Chloride: 105 mEq/L (ref 96–112)
Creatinine, Ser: 0.72 mg/dL (ref 0.50–1.10)
GFR calc Af Amer: >90 mL/min (ref >90)
GFR calc non Af Amer: >90 mL/min (ref >90)
Glucose, Bld: 104 mg/dL — ABNORMAL HIGH (ref 70–99)
Potassium: 3.9 mEq/L (ref 3.5–5.1)
Sodium: 138 mEq/L (ref 135–145)

## 2011-09-10 LAB — CBC
HCT: 30.7 % — ABNORMAL LOW (ref 36.0–46.0)
Hemoglobin: 10.2 g/dL — ABNORMAL LOW (ref 12.0–15.0)
MCH: 30.2 pg (ref 26.0–34.0)
MCHC: 33.2 g/dL (ref 30.0–36.0)
MCV: 90.8 fL (ref 78.0–100.0)
Platelets: 244 10*3/uL (ref 150–400)
RBC: 3.38 MIL/uL — ABNORMAL LOW (ref 3.87–5.11)
RDW: 13.8 % (ref 11.5–15.5)
WBC: 8.5 10*3/uL (ref 4.0–10.5)

## 2011-09-10 MED ORDER — INFLUENZA VIRUS VACC SPLIT PF IM SUSP
0.5000 mL | INTRAMUSCULAR | Status: AC
  Administered 2011-09-11: 0.5 mL via INTRAMUSCULAR
  Filled 2011-09-10: qty 0.5

## 2011-09-10 NOTE — Progress Notes (Signed)
Occupational Therapy Evaluation Patient Details Name: Barbara Fletcher MRN: [id number] DOB: Oct 09, 1950 Today's Date: 09/10/2011  Problem List: There is no problem list on file for this patient.   Past Medical History:  Past Medical History  Diagnosis Date  . Arthritis   . Diverticulitis    Past Surgical History:  Past Surgical History  Procedure Date  . Tonsillectomy     1958  . Oophorectomy     1981  . Abdominal hysterectomy 1990  . Cesarean section 1985, 1988  . Cholecystectomy 1986  . Colostomy   . Colostomy closure     abd diverticulitis with colostomy, reversal 2007- 2008    OT Assessment/Plan/Recommendation OT Assessment Clinical Impression Statement: 61 yo female s/p PLIF that could benefit from skilled OT acutely.  OT Recommendation/Assessment: Patient will need skilled OT in the acute care venue OT Problem List: Decreased strength;Decreased activity tolerance;Impaired balance (sitting and/or standing);Pain OT Therapy Diagnosis : Generalized weakness OT Plan OT Frequency: Min 2X/week OT Treatment/Interventions: Self-care/ADL training;Patient/family education;Balance training OT Recommendation Follow Up Recommendations: No OT follow up Equipment Recommended: Rolling walker with 5" wheels Individuals Consulted Consulted and Agree with Results and Recommendations: Patient OT Goals Acute Rehab OT Goals OT Goal Formulation: With patient Time For Goal Achievement: 7 days ADL Goals Pt Will Perform Tub/Shower Transfer: Tub transfer;with modified independence;with DME ADL Goal: Tub/Shower Transfer - Progress: Not addressed  OT Evaluation Precautions/Restrictions  Precautions Precautions: Back;Fall Precaution Booklet Issued: No Required Braces or Orthoses: Yes Spinal Brace: Lumbar corset;Applied in sitting position Restrictions Weight Bearing Restrictions: No Prior Functioning Home Living Lives With: Spouse Type of Home: House Home Layout: One level Home  Access: Stairs to enter Entrance Stairs-Rails: Left Entrance Stairs-Number of Steps: 4 Bathroom Shower/Tub: Forensic scientist: Standard Home Adaptive Equipment: Straight cane Prior Function Level of Independence: Independent with basic ADLs;Independent with homemaking with ambulation;Independent with gait Vocation: Full time employment ADL ADL Grooming: Performed;Wash/dry hands;Supervision/safety Where Assessed - Grooming: Standing at sink Upper Body Dressing: Performed;Modified independent Where Assessed - Upper Body Dressing: Sitting, chair;Unsupported Lower Body Dressing: Simulated;Supervision/safety Where Assessed - Lower Body Dressing: Sitting, bed;Unsupported (pt able to cross bil le) Toilet Transfer: Performed;Modified independent Toilet Transfer Method: Stand pivot;Ambulating Toilet Transfer Equipment: Regular height toilet Toileting - Clothing Manipulation: Performed;Modified independent Where Assessed - Toileting Clothing Manipulation: Sit to stand from 3-in-1 or toilet Toileting - Hygiene: Performed;Modified independent Where Assessed - Toileting Hygiene: Sit to stand from 3-in-1 or toilet Equipment Used: Rolling walker Ambulation Related to ADLs: pt supervision level with ambulation to bathroom ADL Comments: Pt recalled 3 out 3 precautions. Pt able to don brace Supervision level. Pt educated on placing brace over head to avoid twisting. Pt heavily relies on Rt Ue and educated on risk to twist.  Vision/Perception  Vision - History Baseline Vision: Wears glasses all the time Patient Visual Report: No change from baseline Cognition Cognition Arousal/Alertness: Awake/alert Overall Cognitive Status: Appears within functional limits for tasks assessed Orientation Level: Oriented X4 Sensation/Coordination Sensation Light Touch: Appears Intact Coordination Gross Motor Movements are Fluid and Coordinated: Yes Fine Motor Movements are Fluid and  Coordinated: Yes Extremity Assessment RUE Assessment RUE Assessment: Within Functional Limits LUE Assessment LUE Assessment: Within Functional Limits Mobility  Bed Mobility Bed Mobility: Yes Supine to Sit: 5: Supervision;HOB flat;With rails Supine to Sit Details (indicate cue type and reason): pt demonstrating bed mobility as educated by PT Sit to Supine: 5: Supervision;With rail Transfers Transfers: Yes Sit to Stand: 5: Supervision;Without upper extremity assist;From bed  Stand to Sit: 5: Supervision;With upper extremity assist;To bed Exercises   End of Session OT - End of Session Equipment Utilized During Treatment: Gait belt;Back brace Activity Tolerance: Patient tolerated treatment well Patient left: in bed;with call bell in reach;with family/visitor present (husband. ice applied to back) Nurse Communication: Mobility status for transfers;Mobility status for ambulation General Behavior During Session: [facility] for tasks performed Cognition: [facility] for tasks performed   Lucile Shutters 09/10/2011, 3:19 PM  Pager: [phone]

## 2011-09-10 NOTE — Progress Notes (Signed)
   CARE MANAGEMENT NOTE 09/10/2011  Patient:  Barbara Fletcher, Barbara Fletcher   Account Number:  [id number]  Date Initiated:  09/10/2011  Documentation initiated by:  Johny Shock  Subjective/Objective Assessment:   Order for [facility] needs, DME, med assistance.     Action/Plan:   Per PT no HHPT needed, pt has insurance to purchase meds, rolling walker recommended and ordered.   Anticipated DC Date:  09/12/2011   Anticipated DC Plan:  HOME/SELF CARE         Choice offered to / List presented to:     DME arranged  Levan Hurst      DME agency  [facility]        Status of service:  Completed, signed off Medicare Important Message given?   (If response is "NO", the following Medicare IM given date fields will be blank) Date Medicare IM given:   Date Additional Medicare IM given:    Discharge Disposition:  HOME/SELF CARE  Per UR Regulation:    Comments:

## 2011-09-10 NOTE — Progress Notes (Signed)
Patient ID: Barbara Fletcher, female   DOB: 10-14-1950, 61 y.o.   MRN: [id number] Subjective:  The patient is alert and pleasant. He is appropriately sore. She looks well.  Objective: Vital signs in last 24 hours: Temp:  [97.4 F (36.3 C)-99.5 F (37.5 C)] 99.5 F (37.5 C) (01/11 0600) Pulse Rate:  [82-102] 92  (01/11 0600) Resp:  [16-20] 16  (01/11 0600) BP: (98-147)/(52-81) 120/67 mmHg (01/11 0632) SpO2:  [97 %-100 %] 97 % (01/11 0600) Weight:  [87.1 kg (192 lb 0.3 oz)] 87.1 kg (192 lb 0.3 oz) (01/10 2033)  Intake/Output from previous day: 01/10 0701 - 01/11 0700 In: 3860 [P.O.:180; I.V.:3000; Blood:180; IV Piggyback:500] Out: 1025 [Urine:425; Blood:600] Intake/Output this shift:    Physical exam the patient is alert and oriented. Her motor strength is grossly normal her bilateral lower extremities.  Lab Results:  [facility] 09/10/11 0637  WBC 8.5  HGB 10.2*  HCT 30.7*  PLT 244   BMET No results found for this basename: NA:2,K:2,CL:2,CO2:2,GLUCOSE:2,BUN:2,CREATININE:2,CALCIUM:2 in the last 72 hours  Studies/Results: Dg Lumbar Spine 2-3 Views  09/09/2011  *RADIOLOGY REPORT*  Clinical Data: Back pain  LUMBAR SPINE - 2-3 VIEW  Comparison: Intraoperative films.  Findings: C-arm films document L3-L5 PLIF.  No gross adverse features.  IMPRESSION: As above.  Original Report Authenticated By: Elsie Stain, M.D.   Dg Lumbar Spine 1 View  09/09/2011  *RADIOLOGY REPORT*  Clinical Data: L3-5 PLIF.  LUMBAR SPINE - 1 VIEW  Comparison: None.  Findings: A single intraoperative cross-table lateral view of the lumbar spine, taken at 1320 hours, is submitted.  Surgical instrument tip projects in the region of the L3-4 facet joints, where there is grade 1 anterolisthesis.  Osseous detail is degraded by technique.  IMPRESSION: Intraoperative visualization for L3-5 PLIF.  Original Report Authenticated By: Reyes Ivan, M.D.    Assessment/Plan: Postop day 1: Patient is doing well. We will  mobilize her with PT and OT. We will discontinue her Foley. The patient will likely go home on Sunday. I've given her her discharge instructions and answered all her questions.  LOS: 1 day     Bari Handshoe D 09/10/2011, 8:00 AM

## 2011-09-10 NOTE — Progress Notes (Signed)
Physical Therapy Evaluation Patient Details Name: Barbara Fletcher MRN: [id number] DOB: March 14, 1951 Today's Date: 09/10/2011  Problem List: There is no problem list on file for this patient.   Past Medical History:  Past Medical History  Diagnosis Date  . Arthritis   . Diverticulitis    Past Surgical History:  Past Surgical History  Procedure Date  . Tonsillectomy     1958  . Oophorectomy     1981  . Abdominal hysterectomy 1990  . Cesarean section 1985, 1988  . Cholecystectomy 1986  . Colostomy   . Colostomy closure     abd diverticulitis with colostomy, reversal 2007- 2008    PT Assessment/Plan/Recommendation PT Assessment Clinical Impression Statement: Pt s/p back surgery and limited by pain and lack of knowledge of precautions and equipment will beneift from skilled PT to maximize functional I towards prior I level. PT Recommendation/Assessment: Patient will need skilled PT in the acute care venue PT Problem List: Decreased mobility;Decreased balance;Decreased knowledge of precautions;Pain;Decreased knowledge of use of DME Barriers to Discharge: None PT Therapy Diagnosis : Difficulty walking;Acute pain PT Plan PT Frequency: Min 5X/week PT Treatment/Interventions: DME instruction;Gait training;Stair training;Functional mobility training;Balance training PT Recommendation Follow Up Recommendations: No PT follow up Equipment Recommended: Rolling walker with 5" wheels (TBD- if yes will need YOUTH RW) PT Goals  Acute Rehab PT Goals PT Goal Formulation: With patient/family Time For Goal Achievement: 7 days Pt will go Supine/Side to Sit: with modified independence Pt will go Sit to Supine/Side: with modified independence Pt will go Sit to Stand: with modified independence Pt will go Stand to Sit: with modified independence Pt will Stand: with modified independence Pt will Ambulate: >150 feet;with modified independence Pt will Go Up / Down Stairs: 3-5 stairs;with  supervision;with rail(s)  PT Evaluation Precautions/Restrictions  Precautions Precautions: Back;Fall Precaution Booklet Issued: No Required Braces or Orthoses: Yes Spinal Brace: Lumbar corset;Applied in sitting position Restrictions Weight Bearing Restrictions: No Prior Functioning  Home Living Lives With: Spouse Type of Home: House Home Layout: One level Home Access: Stairs to enter Entrance Stairs-Rails: Left Entrance Stairs-Number of Steps: 4 Home Adaptive Equipment: Straight cane Prior Function Level of Independence: Independent with basic ADLs;Independent with homemaking with ambulation;Independent with gait Vocation: Full time employment Cognition Cognition Arousal/Alertness: Awake/alert Overall Cognitive Status: Appears within functional limits for tasks assessed Orientation Level: Oriented X4 Sensation/Coordination Sensation Light Touch: Appears Intact;Impaired Detail Proprioception: Appears Intact Additional Comments:  reports new numbness/tingling R hip anteriorly Extremity Assessment RLE Assessment RLE Assessment: Within Functional Limits LLE Assessment LLE Assessment: Within Functional Limits Mobility (including Balance) Bed Mobility Bed Mobility: Yes Rolling Left: 5: Supervision;With rail Rolling Left Details (indicate cue type and reason): vc technique Supine to Sit: 5: Supervision Supine to Sit Details (indicate cue type and reason): vc for technique Sit to Supine: 5: Supervision Transfers Transfers: Yes Sit to Stand: 5: Supervision;Without upper extremity assist;From bed Stand to Sit: 5: Supervision;With upper extremity assist;To bed Ambulation/Gait Ambulation/Gait: Yes Ambulation/Gait Assistance: 5: Supervision Assistive device: Rolling walker Gait Pattern: Step-through pattern Gait velocity: decreased velocity with horizontal head turns Stairs: No  Posture/Postural Control Posture/Postural Control: No significant limitations Balance Balance  Assessed: Yes Static Sitting Balance Static Sitting - Level of Assistance: 5: Stand by assistance Dynamic Sitting Balance Dynamic Sitting - Level of Assistance: 5: Stand by assistance Static Standing Balance Static Standing - Level of Assistance: 4: Min assist (posterior LOB) Dynamic Standing Balance Dynamic Standing - Level of Assistance: 5: Stand by assistance (with UE support) Exercise  End of Session PT - End of Session Equipment Utilized During Treatment: Gait belt Activity Tolerance: Patient tolerated treatment well Patient left: in bed;with family/visitor present General Behavior During Session: [facility] for tasks performed Cognition: [facility] for tasks performed  Michaelene Song 09/10/2011, 10:19 AM

## 2011-09-11 NOTE — Progress Notes (Signed)
Occupational Therapy Treatment Patient Details Name: Barbara Fletcher MRN: [id number] DOB: 1951-07-24 Today's Date: 09/11/2011  OT Assessment/Plan OT Assessment/Plan Comments on Treatment Session: Pt. moving well and educated on use of AE for LB ADLs and tub transfer with use of bench OT Plan: Discharge plan remains appropriate OT Frequency: Min 2X/week Follow Up Recommendations: No OT follow up Equipment Recommended: Rolling walker with 5" wheels OT Goals Acute Rehab OT Goals OT Goal Formulation: With patient Time For Goal Achievement: 7 days ADL Goals Pt Will Perform Tub/Shower Transfer: Tub transfer;with modified independence;with DME ADL Goal: Tub/Shower Transfer - Progress: Progressing toward goals  OT Treatment Precautions/Restrictions  Precautions Precautions: Back;Fall Precaution Booklet Issued: No Required Braces or Orthoses: Yes Spinal Brace: Lumbar corset;Applied in sitting position Restrictions Weight Bearing Restrictions: No   ADL ADL Grooming: Performed;Wash/dry hands;Supervision/safety Where Assessed - Grooming: Standing at sink Tub/Shower Transfer: Simulated;Minimal Radiation protection practitioner Details (indicate cue type and reason): Mod verbal cues for hand placement and technique to prevent twisting due to pt. attempting to twist. Min assist to lift Rt LT over wall of tub. Tub/Shower Transfer Method: Science writer: Counsellor Used: Rolling walker Ambulation Related to ADLs: Pt. supervision ~150' to practice tub transfer ADL Comments: Pt recalled 3 out 3 precautions. Pt able to don brace Supervision level. Pt educated on placing brace over head to avoid twisting. Pt heavily relies on Rt Ue and educated on risk to twist.  Mobility  Bed Mobility Bed Mobility: No     End of Session OT - End of Session Equipment Utilized During Treatment: Gait belt;Back brace Activity Tolerance: Patient tolerated treatment  well Patient left: in chair;with call bell in reach;with family/visitor present Nurse Communication: Mobility status for transfers;Mobility status for ambulation General Behavior During Session: [facility] for tasks performed Cognition: [facility] for tasks performed  Cassandria Anger, OTR/L Pager [phone]  09/11/2011, 1:00 PM

## 2011-09-11 NOTE — Progress Notes (Signed)
Patient ID: Barbara Fletcher, female   DOB: 09-14-1950, 61 y.o.   MRN: [id number] Ambulating with pt help. No weakness. sensotu nl. Able to void. Plan po analgesia.

## 2011-09-11 NOTE — Progress Notes (Signed)
Physical Therapy Treatment Patient Details Name: Barbara Fletcher MRN: [id number] DOB: 1951/01/14 Today's Date: 09/11/2011  PT Assessment/Plan  PT - Assessment/Plan Comments on Treatment Session: Great progression with mobility today. Pt at mobility level safe for discharge today. PT Plan: Discharge plan remains appropriate PT Frequency: Min 5X/week Follow Up Recommendations: No PT follow up Equipment Recommended: Rolling walker with 5" wheels (short RW) PT Goals  Acute Rehab PT Goals PT Goal: Supine/Side to Sit - Progress: Progressing toward goal PT Goal: Sit to Supine/Side - Progress: Other (comment) ((NT)) PT Goal: Sit to Stand - Progress: Progressing toward goal PT Goal: Stand to Sit - Progress: Progressing toward goal PT Goal: Stand - Progress: Met PT Goal: Ambulate - Progress: Progressing toward goal PT Goal: Up/Down Stairs - Progress: Met  PT Treatment Precautions/Restrictions  Precautions Precautions: Back Precaution Booklet Issued: No Required Braces or Orthoses: Yes Spinal Brace: Lumbar corset;Applied in sitting position (pt/spouse independent in donning brace) Restrictions Weight Bearing Restrictions: No Mobility (including Balance) Bed Mobility Bed Mobility: No Right Sidelying to Sit: 5: Supervision;With rails;HOB flat Right Sidelying to Sit Details (indicate cue type and reason): increased time needed. used rail to assist with trunk elevation into sitting on edge of bed (has a bedside table at home she plans to use) Transfers Sit to Stand: 5: Supervision;With upper extremity assist;From bed Sit to Stand Details (indicate cue type and reason): cues for safe technique and hand placement with standing (pt trying to pull up on RW) Stand to Sit: 5: Supervision;To chair/3-in-1;With upper extremity assist Stand to Sit Details: pt demo'd a safe technique with no cues or assist needed. Ambulation/Gait Ambulation/Gait Assistance: 5: Supervision Ambulation/Gait Assistance  Details (indicate cue type and reason): demo's reciprocal steps with one verbal cue for posture. Ambulation Distance (Feet): 220 Feet Assistive device: Rolling walker Gait Pattern: Step-through pattern;Decreased step length - right;Decreased step length - left;Trunk flexed Stairs: Yes Stairs Assistance: 5: Supervision Stair Management Technique: No rails;Step to pattern;Forwards (HHA with spouse assist.) Number of Stairs: 5  (5 stairs x2 trials)  Posture/Postural Control Posture/Postural Control: No significant limitations Exercise    End of Session PT - End of Session Equipment Utilized During Treatment: Gait belt;Back brace Activity Tolerance: Patient tolerated treatment well Patient left: in chair;with call bell in reach;with family/visitor present Nurse Communication: Mobility status for transfers;Mobility status for ambulation General Behavior During Session: [facility] for tasks performed Cognition: [facility] for tasks performed  Sallyanne Kuster 09/11/2011, 1:27 PM  Sallyanne Kuster, PTA Office- [phone]

## 2011-09-12 MED ORDER — BISACODYL 10 MG RE SUPP: 10.0000 mg | Freq: Every day | RECTAL | Status: DC | PRN

## 2011-09-12 NOTE — Progress Notes (Signed)
Patient ID: Barbara Fletcher, female   DOB: 16-May-1951, 61 y.o.   MRN: [id number] Better, no weakness. Wound dry.decrease incisional pain.

## 2011-09-13 MED ORDER — DSS 100 MG PO CAPS: 100.0000 mg | ORAL_CAPSULE | Freq: Two times a day (BID) | ORAL | Status: AC

## 2011-09-13 MED ORDER — OXYCODONE-ACETAMINOPHEN 10-325 MG PO TABS: 1.0000 | ORAL_TABLET | ORAL | Status: AC | PRN

## 2011-09-13 MED ORDER — DIAZEPAM 5 MG PO TABS: 5.0000 mg | ORAL_TABLET | Freq: Four times a day (QID) | ORAL | Status: AC | PRN

## 2011-09-13 MED FILL — Sodium Chloride IV Soln 0.9%: INTRAVENOUS | Qty: 2000 | Status: AC

## 2011-09-13 MED FILL — Heparin Sodium (Porcine) Inj 1000 Unit/ML: INTRAMUSCULAR | Qty: 30 | Status: AC

## 2011-09-13 NOTE — Progress Notes (Signed)
Tub bench ordered thru [facility].

## 2011-09-13 NOTE — Progress Notes (Signed)
Patient ID: Barbara Fletcher, female   DOB: 08-Sep-1950, 61 y.o.   MRN: [id number] Subjective:  The patient is alert and pleasant. She looks well and wants to go home. She requests a tub bench.  Objective: Vital signs in last 24 hours: Temp:  [98.2 F (36.8 C)-99.1 F (37.3 C)] 98.2 F (36.8 C) (01/14 0944) Pulse Rate:  [73-93] 73  (01/14 0944) Resp:  [16-20] 18  (01/14 0944) BP: (116-128)/(71-79) 116/72 mmHg (01/14 0944) SpO2:  [91 %-96 %] 94 % (01/14 0944)  Intake/Output from previous day: 01/13 0701 - 01/14 0700 In: 240 [P.O.:240] Out: -  Intake/Output this shift: Total I/O In: 360 [P.O.:360] Out: -   Physical exam the patient is alert and oriented. Her lower extremity strength is grossly normal. She is ambulating well.  Lab Results: No results found for this basename: WBC:2,HGB:2,HCT:2,PLT:2 in the last 72 hours BMET No results found for this basename: NA:2,K:2,CL:2,CO2:2,GLUCOSE:2,BUN:2,CREATININE:2,CALCIUM:2 in the last 72 hours  Studies/Results: No results found.  Assessment/Plan: Postop day #4: The patient is doing well. I will discharge her to home. I've given the patient oral discharge instructions and answered all their questions.  LOS: 4 days     Edwar Coe D 09/13/2011, 11:29 AM

## 2011-09-13 NOTE — Discharge Summary (Signed)
  Physician Discharge Summary  Patient ID: Barbara Fletcher MRN: [id number] DOB/AGE: May 14, 1951 61 y.o.  Admit date: 09/09/2011 Discharge date: 09/13/2011  Admission Diagnoses:  Discharge Diagnoses:  Active Problems:  * No active hospital problems. *    Discharged Condition: good  Hospital Course: I admitted the patient to [facility] [place] on 09/09/2011. On that day I performed an L3-4 and L4-5 decompression, instrumentation, and fusion. The surgery went well. The patient's postoperative course was unremarkable and she was discharged to home on 09/13/2011.  Consults: None Significant Diagnostic Studies: None Treatments: L3-4 and L4-5 decompression, instrumentation, and fusion. Discharge Exam: Blood pressure 116/72, pulse 73, temperature 98.2 F (36.8 C), temperature source Oral, resp. rate 18, height 5\' 2"  (1.575 m), weight 87.1 kg (192 lb 0.3 oz), SpO2 94.00%. The patient is alert and oriented. Her lower extremity strength is grossly normal.  Disposition: Home  Discharge Orders    Future Orders Please Complete By Expires   Diet - low sodium heart healthy      Increase activity slowly      Discharge instructions      Comments:   The patient was given oral discharge instructions. All her questions were answered. She was instructed to call [phone] for a follow up appointment.   No dressing needed      Call MD for:  temperature >100.4      Call MD for:  persistant nausea and vomiting      Call MD for:  severe uncontrolled pain      Call MD for:  redness, tenderness, or signs of infection (pain, swelling, redness, odor or green/yellow discharge around incision site)      Call MD for:  difficulty breathing, headache or visual disturbances      Call MD for:  hives      Call MD for:  persistant dizziness or light-headedness      Call MD for:  extreme fatigue      Nursing communication      Scheduling Instructions:   Please get the patient at tub bench.     Current  Discharge Medication List    START taking these medications   Details  diazepam (VALIUM) 5 MG tablet Take 1 tablet (5 mg total) by mouth every 6 (six) hours as needed. Qty: 50 tablet, Refills: 1    docusate sodium 100 MG CAPS Take 100 mg by mouth 2 (two) times daily. Qty: 60 capsule, Refills: 1    oxyCODONE-acetaminophen (PERCOCET) 10-325 MG per tablet Take 1 tablet by mouth every 4 (four) hours as needed for pain. Qty: 100 tablet, Refills: 0      CONTINUE these medications which have NOT CHANGED   Details  fexofenadine-pseudoephedrine (ALLEGRA-D 24) 180-240 MG per 24 hr tablet Take 1 tablet by mouth daily as needed. Seasonal allergies       STOP taking these medications     ibuprofen (ADVIL,MOTRIN) 200 MG tablet          Signed: Sami Froh D 09/13/2011, 11:28 AM

## 2011-09-13 NOTE — Progress Notes (Signed)
Physical Therapy Treatment Patient Details Name: Barbara Fletcher MRN: [id number] DOB: 1950-10-06 Today's Date: 09/13/2011  PT Assessment/Plan  PT - Assessment/Plan Comments on Treatment Session: Pt moving well and plans to d/c home today.  No further questions from pt and able to ambulate with supervision.  Pt will have assistance at home to safely d/c home today.   PT Plan: Discharge plan remains appropriate PT Frequency: Min 5X/week Follow Up Recommendations: No PT follow up Equipment Recommended: Rolling walker with 5" wheels PT Goals  Acute Rehab PT Goals PT Goal Formulation: With patient/family Time For Goal Achievement: 7 days Pt will go Supine/Side to Sit: with modified independence PT Goal: Supine/Side to Sit - Progress: Progressing toward goal Pt will go Sit to Stand: with modified independence PT Goal: Sit to Stand - Progress: Progressing toward goal Pt will go Stand to Sit: with modified independence PT Goal: Stand to Sit - Progress: Progressing toward goal Pt will Stand: with modified independence PT Goal: Stand - Progress: Progressing toward goal Pt will Ambulate: >150 feet;with modified independence PT Goal: Ambulate - Progress: Progressing toward goal Pt will Go Up / Down Stairs: 3-5 stairs;with supervision;with rail(s) PT Goal: Up/Down Stairs - Progress: Progressing toward goal  PT Treatment Precautions/Restrictions  Precautions Precautions: Back Precaution Booklet Issued: No Required Braces or Orthoses: Yes Spinal Brace: Lumbar corset;Applied in sitting position Restrictions Weight Bearing Restrictions: No Mobility (including Balance) Bed Mobility Bed Mobility: No Transfers Transfers: Yes Sit to Stand: 6: Modified independent (Device/Increase time);From chair/3-in-1 Stand to Sit: 6: Modified independent (Device/Increase time);To chair/3-in-1 Ambulation/Gait Ambulation/Gait: Yes Ambulation/Gait Assistance: 5: Supervision Ambulation/Gait Assistance  Details (indicate cue type and reason): Pt able to ambulate 200' and ambulated ~40' without RW and needed minguard (A) without RW.   Ambulation Distance (Feet): 200 Feet Assistive device: Rolling walker Gait Pattern: Step-through pattern;Decreased step length - right;Decreased step length - left;Trunk flexed Gait velocity: decreased velocity with horizontal head turns Stairs: No  Posture/Postural Control Posture/Postural Control: No significant limitations Exercise    End of Session PT - End of Session Equipment Utilized During Treatment: Gait belt;Back brace Activity Tolerance: Patient tolerated treatment well Patient left: in chair;with call bell in reach;with family/visitor present Nurse Communication: Mobility status for transfers;Mobility status for ambulation General Behavior During Session: [facility] for tasks performed Cognition: [facility] for tasks performed  Careen Mauch 09/13/2011, 2:42 PM [phone]

## 2011-09-14 NOTE — Progress Notes (Signed)
Utilization review completed. Zeena Starkel, RN, BSN. 09/14/11 

## 2012-02-13 ENCOUNTER — Encounter (HOSPITAL_COMMUNITY): Admission: EM | Disposition: A | Discharge status: Home Health | Payer: Self-pay | Source: Ambulatory Visit

## 2012-02-13 ENCOUNTER — Encounter (HOSPITAL_COMMUNITY): Payer: Self-pay | Admitting: Anesthesiology

## 2012-02-13 ENCOUNTER — Encounter (HOSPITAL_COMMUNITY): Payer: Self-pay | Admitting: *Deleted

## 2012-02-13 ENCOUNTER — Inpatient Hospital Stay (HOSPITAL_COMMUNITY): Payer: PRIVATE HEALTH INSURANCE | Admitting: Anesthesiology

## 2012-02-13 ENCOUNTER — Inpatient Hospital Stay (HOSPITAL_COMMUNITY)
Admission: EM | Admit: 2012-02-13 | Discharge: 2012-02-23 | DRG: 329 | Disposition: A | Discharge status: Home Health | Payer: PRIVATE HEALTH INSURANCE | Source: Ambulatory Visit | Attending: Surgery | Admitting: Surgery

## 2012-02-13 ENCOUNTER — Encounter (HOSPITAL_COMMUNITY): Payer: Self-pay | Admitting: Family Medicine

## 2012-02-13 ENCOUNTER — Emergency Department (HOSPITAL_COMMUNITY): Payer: PRIVATE HEALTH INSURANCE

## 2012-02-13 DIAGNOSIS — Z9089 Acquired absence of other organs: Secondary | ICD-10-CM

## 2012-02-13 DIAGNOSIS — S31109A Unspecified open wound of abdominal wall, unspecified quadrant without penetration into peritoneal cavity, initial encounter: Secondary | ICD-10-CM

## 2012-02-13 DIAGNOSIS — K430 Incisional hernia with obstruction, without gangrene: Principal | ICD-10-CM | POA: Diagnosis present

## 2012-02-13 DIAGNOSIS — Z888 Allergy status to other drugs, medicaments and biological substances status: Secondary | ICD-10-CM

## 2012-02-13 DIAGNOSIS — Z8719 Personal history of other diseases of the digestive system: Secondary | ICD-10-CM

## 2012-02-13 DIAGNOSIS — K55059 Acute (reversible) ischemia of intestine, part and extent unspecified: Secondary | ICD-10-CM | POA: Diagnosis present

## 2012-02-13 DIAGNOSIS — Z90710 Acquired absence of both cervix and uterus: Secondary | ICD-10-CM

## 2012-02-13 DIAGNOSIS — Z9079 Acquired absence of other genital organ(s): Secondary | ICD-10-CM

## 2012-02-13 DIAGNOSIS — M129 Arthropathy, unspecified: Secondary | ICD-10-CM | POA: Diagnosis present

## 2012-02-13 DIAGNOSIS — R030 Elevated blood-pressure reading, without diagnosis of hypertension: Secondary | ICD-10-CM | POA: Diagnosis not present

## 2012-02-13 DIAGNOSIS — Z9049 Acquired absence of other specified parts of digestive tract: Secondary | ICD-10-CM

## 2012-02-13 DIAGNOSIS — E876 Hypokalemia: Secondary | ICD-10-CM | POA: Diagnosis not present

## 2012-02-13 DIAGNOSIS — K436 Other and unspecified ventral hernia with obstruction, without gangrene: Secondary | ICD-10-CM

## 2012-02-13 DIAGNOSIS — K660 Peritoneal adhesions (postprocedural) (postinfection): Secondary | ICD-10-CM | POA: Diagnosis present

## 2012-02-13 DIAGNOSIS — K56609 Unspecified intestinal obstruction, unspecified as to partial versus complete obstruction: Secondary | ICD-10-CM

## 2012-02-13 DIAGNOSIS — K631 Perforation of intestine (nontraumatic): Secondary | ICD-10-CM | POA: Diagnosis present

## 2012-02-13 DIAGNOSIS — K560 Paralytic ileus: Secondary | ICD-10-CM | POA: Diagnosis not present

## 2012-02-13 HISTORY — PX: LAPAROTOMY: SHX154

## 2012-02-13 LAB — CBC
HCT: 45.7 % (ref 36.0–46.0)
Hemoglobin: 15.8 g/dL — ABNORMAL HIGH (ref 12.0–15.0)
MCH: 30.4 pg (ref 26.0–34.0)
MCHC: 34.6 g/dL (ref 30.0–36.0)
MCV: 87.9 fL (ref 78.0–100.0)
Platelets: 358 10*3/uL (ref 150–400)
RBC: 5.2 MIL/uL — ABNORMAL HIGH (ref 3.87–5.11)
RDW: 15 % (ref 11.5–15.5)
WBC: 12.5 10*3/uL — ABNORMAL HIGH (ref 4.0–10.5)

## 2012-02-13 LAB — COMPREHENSIVE METABOLIC PANEL
ALT: 83 U/L — ABNORMAL HIGH (ref 0–35)
AST: 67 U/L — ABNORMAL HIGH (ref 0–37)
Albumin: 4.4 g/dL (ref 3.5–5.2)
Alkaline Phosphatase: 126 U/L — ABNORMAL HIGH (ref 39–117)
BUN: 25 mg/dL — ABNORMAL HIGH (ref 6–23)
CO2: 25 mEq/L (ref 19–32)
Calcium: 11.3 mg/dL — ABNORMAL HIGH (ref 8.4–10.5)
Chloride: 97 mEq/L (ref 96–112)
Creatinine, Ser: 0.85 mg/dL (ref 0.50–1.10)
GFR calc Af Amer: 85 mL/min — ABNORMAL LOW (ref >90)
GFR calc non Af Amer: 73 mL/min — ABNORMAL LOW (ref >90)
Glucose, Bld: 152 mg/dL — ABNORMAL HIGH (ref 70–99)
Potassium: 4.9 mEq/L (ref 3.5–5.1)
Sodium: 138 mEq/L (ref 135–145)
Total Bilirubin: 0.5 mg/dL (ref 0.3–1.2)
Total Protein: 8.7 g/dL — ABNORMAL HIGH (ref 6.0–8.3)

## 2012-02-13 LAB — LIPASE, BLOOD: Lipase: 28 U/L (ref 11–59)

## 2012-02-13 SURGERY — LAPAROTOMY, EXPLORATORY
Anesthesia: General | Site: Abdomen | Wound class: Dirty or Infected

## 2012-02-13 MED ORDER — ONDANSETRON HCL 4 MG/2ML IJ SOLN
4.0000 mg | Freq: Once | INTRAMUSCULAR | Status: AC
  Administered 2012-02-13: 4 mg via INTRAVENOUS
  Filled 2012-02-13: qty 2

## 2012-02-13 MED ORDER — PANTOPRAZOLE SODIUM 40 MG IV SOLR
40.0000 mg | Freq: Every day | INTRAVENOUS | Status: DC
  Administered 2012-02-13 – 2012-02-22 (×10): 40 mg via INTRAVENOUS
  Filled 2012-02-13 (×11): qty 40

## 2012-02-13 MED ORDER — CHLORHEXIDINE GLUCONATE 0.12 % MT SOLN
15.0000 mL | Freq: Two times a day (BID) | OROMUCOSAL | Status: DC
  Administered 2012-02-14 – 2012-02-22 (×16): 15 mL via OROMUCOSAL
  Filled 2012-02-13 (×16): qty 15

## 2012-02-13 MED ORDER — ACETAMINOPHEN 325 MG PO TABS: 650.0000 mg | ORAL_TABLET | Freq: Four times a day (QID) | ORAL | Status: DC | PRN

## 2012-02-13 MED ORDER — LIDOCAINE HCL (CARDIAC) 20 MG/ML IV SOLN
INTRAVENOUS | Status: DC | PRN
  Administered 2012-02-13: 80 mg via INTRAVENOUS

## 2012-02-13 MED ORDER — VECURONIUM BROMIDE 10 MG IV SOLR
INTRAVENOUS | Status: DC | PRN
  Administered 2012-02-13: 3 mg via INTRAVENOUS
  Administered 2012-02-13: 4 mg via INTRAVENOUS

## 2012-02-13 MED ORDER — HYDROMORPHONE HCL PF 1 MG/ML IJ SOLN: 0.2500 mg | INTRAMUSCULAR | Status: DC | PRN

## 2012-02-13 MED ORDER — ONDANSETRON HCL 4 MG/2ML IJ SOLN: 4.0000 mg | Freq: Once | INTRAMUSCULAR | Status: DC | PRN

## 2012-02-13 MED ORDER — SODIUM CHLORIDE 0.9 % IV SOLN
INTRAVENOUS | Status: DC
  Administered 2012-02-13 – 2012-02-18 (×8): via INTRAVENOUS

## 2012-02-13 MED ORDER — SODIUM CHLORIDE 0.9 % IV SOLN
3.0000 g | Freq: Once | INTRAVENOUS | Status: AC
  Administered 2012-02-13: 3 g via INTRAVENOUS
  Filled 2012-02-13: qty 3

## 2012-02-13 MED ORDER — DEXAMETHASONE SODIUM PHOSPHATE 4 MG/ML IJ SOLN
INTRAMUSCULAR | Status: DC | PRN
  Administered 2012-02-13: 4 mg via INTRAVENOUS

## 2012-02-13 MED ORDER — NEOSTIGMINE METHYLSULFATE 1 MG/ML IJ SOLN
INTRAMUSCULAR | Status: DC | PRN
  Administered 2012-02-13: 5 mg via INTRAVENOUS

## 2012-02-13 MED ORDER — METOCLOPRAMIDE HCL 5 MG/ML IJ SOLN
INTRAMUSCULAR | Status: DC | PRN
  Administered 2012-02-13: 10 mg via INTRAVENOUS

## 2012-02-13 MED ORDER — EPHEDRINE SULFATE 50 MG/ML IJ SOLN
INTRAMUSCULAR | Status: DC | PRN
  Administered 2012-02-13: 10 mg via INTRAVENOUS

## 2012-02-13 MED ORDER — FENTANYL CITRATE 0.05 MG/ML IJ SOLN
50.0000 ug | Freq: Once | INTRAMUSCULAR | Status: AC
  Administered 2012-02-13: 50 ug via INTRAVENOUS
  Filled 2012-02-13: qty 2

## 2012-02-13 MED ORDER — BIOTENE DRY MOUTH MT LIQD
15.0000 mL | Freq: Two times a day (BID) | OROMUCOSAL | Status: DC
  Administered 2012-02-14 – 2012-02-20 (×14): 15 mL via OROMUCOSAL

## 2012-02-13 MED ORDER — DROPERIDOL 2.5 MG/ML IJ SOLN
INTRAMUSCULAR | Status: DC | PRN
  Administered 2012-02-13: 0.625 mg via INTRAVENOUS

## 2012-02-13 MED ORDER — ONDANSETRON HCL 4 MG/2ML IJ SOLN: 4.0000 mg | Freq: Four times a day (QID) | INTRAMUSCULAR | Status: DC | PRN

## 2012-02-13 MED ORDER — ACETAMINOPHEN 650 MG RE SUPP: 650.0000 mg | Freq: Four times a day (QID) | RECTAL | Status: DC | PRN

## 2012-02-13 MED ORDER — FENTANYL CITRATE 0.05 MG/ML IJ SOLN
INTRAMUSCULAR | Status: DC | PRN
  Administered 2012-02-13: 150 ug via INTRAVENOUS
  Administered 2012-02-13: 100 ug via INTRAVENOUS
  Administered 2012-02-13: 150 ug via INTRAVENOUS

## 2012-02-13 MED ORDER — DIPHENHYDRAMINE HCL 12.5 MG/5ML PO ELIX
12.5000 mg | ORAL_SOLUTION | Freq: Four times a day (QID) | ORAL | Status: DC | PRN
  Filled 2012-02-13: qty 5

## 2012-02-13 MED ORDER — ONDANSETRON HCL 4 MG/2ML IJ SOLN
INTRAMUSCULAR | Status: DC | PRN
  Administered 2012-02-13: 4 mg via INTRAVENOUS

## 2012-02-13 MED ORDER — SODIUM CHLORIDE 0.9 % IV SOLN: Freq: Once | INTRAVENOUS | Status: DC

## 2012-02-13 MED ORDER — GLYCOPYRROLATE 0.2 MG/ML IJ SOLN
INTRAMUSCULAR | Status: DC | PRN
  Administered 2012-02-13: .6 mg via INTRAVENOUS

## 2012-02-13 MED ORDER — DIPHENHYDRAMINE HCL 50 MG/ML IJ SOLN: 12.5000 mg | Freq: Four times a day (QID) | INTRAMUSCULAR | Status: DC | PRN

## 2012-02-13 MED ORDER — MIDAZOLAM HCL 5 MG/5ML IJ SOLN
INTRAMUSCULAR | Status: DC | PRN
  Administered 2012-02-13: 2 mg via INTRAVENOUS

## 2012-02-13 MED ORDER — SODIUM CHLORIDE 0.9 % IJ SOLN: 9.0000 mL | INTRAMUSCULAR | Status: DC | PRN

## 2012-02-13 MED ORDER — IOHEXOL 300 MG/ML  SOLN
100.0000 mL | Freq: Once | INTRAMUSCULAR | Status: AC | PRN
  Administered 2012-02-13: 100 mL via INTRAVENOUS

## 2012-02-13 MED ORDER — PROPOFOL 10 MG/ML IV EMUL
INTRAVENOUS | Status: DC | PRN
  Administered 2012-02-13: 200 mg via INTRAVENOUS

## 2012-02-13 MED ORDER — MORPHINE SULFATE (PF) 1 MG/ML IV SOLN
INTRAVENOUS | Status: DC
  Administered 2012-02-13: 3 mg via INTRAVENOUS
  Administered 2012-02-13: 13.5 mg via INTRAVENOUS
  Administered 2012-02-14: 30 mg via INTRAVENOUS
  Administered 2012-02-14: 03:00:00 via INTRAVENOUS
  Administered 2012-02-14: 25 mg via INTRAVENOUS
  Administered 2012-02-14: 1.5 mg via INTRAVENOUS
  Administered 2012-02-14: 19.5 mg via INTRAVENOUS
  Administered 2012-02-14: 25 mL via INTRAVENOUS
  Administered 2012-02-15: 6 mg via INTRAVENOUS
  Administered 2012-02-15: 3 mg via INTRAVENOUS
  Administered 2012-02-15: 11.93 mg via INTRAVENOUS
  Administered 2012-02-15: 6 mg via INTRAVENOUS
  Administered 2012-02-15: 7.5 mg via INTRAVENOUS
  Administered 2012-02-16: 5 mg via INTRAVENOUS
  Administered 2012-02-16: 3 mg via INTRAVENOUS
  Administered 2012-02-16: 2.56 mg via INTRAVENOUS
  Administered 2012-02-16: 7.5 mg via INTRAVENOUS
  Administered 2012-02-16: 4.5 mg via INTRAVENOUS
  Administered 2012-02-16: 3 mg via INTRAVENOUS
  Administered 2012-02-17: 11:00:00 via INTRAVENOUS
  Administered 2012-02-17: 1.5 mg via INTRAVENOUS
  Administered 2012-02-17: 4.5 mg via INTRAVENOUS
  Administered 2012-02-17: 1.5 mg via INTRAVENOUS
  Administered 2012-02-17: 6 mg via INTRAVENOUS
  Administered 2012-02-18 (×2): 1.5 mg via INTRAVENOUS
  Filled 2012-02-13 (×6): qty 25

## 2012-02-13 MED ORDER — NALOXONE HCL 0.4 MG/ML IJ SOLN: 0.4000 mg | INTRAMUSCULAR | Status: DC | PRN

## 2012-02-13 MED ORDER — SODIUM CHLORIDE 0.9 % IV SOLN
3.0000 g | Freq: Four times a day (QID) | INTRAVENOUS | Status: DC
  Administered 2012-02-13 – 2012-02-21 (×31): 3 g via INTRAVENOUS
  Filled 2012-02-13 (×33): qty 3

## 2012-02-13 MED ORDER — 0.9 % SODIUM CHLORIDE (POUR BTL) OPTIME
TOPICAL | Status: DC | PRN
  Administered 2012-02-13: 1000 mL

## 2012-02-13 MED ORDER — LACTATED RINGERS IV SOLN
INTRAVENOUS | Status: DC | PRN
  Administered 2012-02-13: 16:00:00 via INTRAVENOUS

## 2012-02-13 MED ORDER — SUCCINYLCHOLINE CHLORIDE 20 MG/ML IJ SOLN
INTRAMUSCULAR | Status: DC | PRN
  Administered 2012-02-13: 80 mg via INTRAVENOUS

## 2012-02-13 SURGICAL SUPPLY — 47 items
BLADE SURG ROTATE 9660 (MISCELLANEOUS) IMPLANT
CANISTER SUCTION 2500CC (MISCELLANEOUS) ×3 IMPLANT
CANISTER WOUND CARE 500ML ATS (WOUND CARE) ×3 IMPLANT
CHLORAPREP W/TINT 26ML (MISCELLANEOUS) ×3 IMPLANT
CLOTH BEACON ORANGE TIMEOUT ST (SAFETY) ×3 IMPLANT
COVER SURGICAL LIGHT HANDLE (MISCELLANEOUS) ×3 IMPLANT
DRAPE LAPAROSCOPIC ABDOMINAL (DRAPES) ×3 IMPLANT
DRAPE WARM FLUID 44X44 (DRAPE) ×3 IMPLANT
DRSG VAC ATS MED SENSATRAC (GAUZE/BANDAGES/DRESSINGS) ×6 IMPLANT
DURAPREP 26ML APPLICATOR (WOUND CARE) ×3 IMPLANT
ELECT BLADE 6.5 EXT (BLADE) IMPLANT
ELECT REM PT RETURN 9FT ADLT (ELECTROSURGICAL) ×3
ELECTRODE REM PT RTRN 9FT ADLT (ELECTROSURGICAL) ×2 IMPLANT
GLOVE BIO SURGEON STRL SZ7 (GLOVE) ×6 IMPLANT
GLOVE BIO SURGEON STRL SZ8 (GLOVE) ×6 IMPLANT
GLOVE BIOGEL PI IND STRL 7.5 (GLOVE) ×2 IMPLANT
GLOVE BIOGEL PI INDICATOR 7.5 (GLOVE) ×1
GOWN STRL NON-REIN LRG LVL3 (GOWN DISPOSABLE) ×9 IMPLANT
KIT BASIN OR (CUSTOM PROCEDURE TRAY) ×3 IMPLANT
KIT ROOM TURNOVER OR (KITS) ×3 IMPLANT
LIGASURE IMPACT 36 18CM CVD LR (INSTRUMENTS) IMPLANT
NS IRRIG 1000ML POUR BTL (IV SOLUTION) ×6 IMPLANT
PACK GENERAL/GYN (CUSTOM PROCEDURE TRAY) ×3 IMPLANT
PAD ARMBOARD 7.5X6 YLW CONV (MISCELLANEOUS) ×3 IMPLANT
RELOAD PROXIMATE 75MM BLUE (ENDOMECHANICALS) ×6 IMPLANT
SPECIMEN JAR LARGE (MISCELLANEOUS) IMPLANT
SPONGE GAUZE 4X4 12PLY (GAUZE/BANDAGES/DRESSINGS) IMPLANT
SPONGE LAP 18X18 X RAY DECT (DISPOSABLE) ×3 IMPLANT
STAPLER GUN LINEAR PROX 60 (STAPLE) ×3 IMPLANT
STAPLER PROXIMATE 75MM BLUE (STAPLE) ×3 IMPLANT
STAPLER VISISTAT 35W (STAPLE) ×3 IMPLANT
SUCTION POOLE TIP (SUCTIONS) ×3 IMPLANT
SUT NOVA NAB DX-16 0-1 5-0 T12 (SUTURE) ×21 IMPLANT
SUT PDS AB 1 TP1 96 (SUTURE) IMPLANT
SUT SILK 2 0 (SUTURE) ×1
SUT SILK 2 0 SH CR/8 (SUTURE) ×3 IMPLANT
SUT SILK 2-0 18XBRD TIE 12 (SUTURE) ×2 IMPLANT
SUT SILK 3 0 (SUTURE) ×1
SUT SILK 3 0 SH CR/8 (SUTURE) ×3 IMPLANT
SUT SILK 3-0 18XBRD TIE 12 (SUTURE) ×2 IMPLANT
SUT VIC AB 3-0 SH 27 (SUTURE)
SUT VIC AB 3-0 SH 27X BRD (SUTURE) IMPLANT
TOWEL OR 17X24 6PK STRL BLUE (TOWEL DISPOSABLE) ×3 IMPLANT
TOWEL OR 17X26 10 PK STRL BLUE (TOWEL DISPOSABLE) ×3 IMPLANT
TRAY FOLEY CATH 14FRSI W/METER (CATHETERS) ×3 IMPLANT
WATER STERILE IRR 1000ML POUR (IV SOLUTION) IMPLANT
YANKAUER SUCT BULB TIP NO VENT (SUCTIONS) IMPLANT

## 2012-02-13 NOTE — ED Provider Notes (Signed)
History     CSN: [id number]  Arrival date & time 02/13/12  4098   First MD Initiated Contact with Patient 02/13/12 0945      Chief Complaint  Patient presents with  . Abdominal Pain  . Nausea  . Emesis    (Consider location/radiation/quality/duration/timing/severity/associated sxs/prior treatment) Patient is a 61 y.o. female presenting with abdominal pain and vomiting. The history is provided by the patient.  Abdominal Pain The primary symptoms of the illness include abdominal pain, nausea and vomiting. The primary symptoms of the illness do not include shortness of breath or diarrhea.  Symptoms associated with the illness do not include back pain.  Emesis  Associated symptoms include abdominal pain. Pertinent negatives include no diarrhea and no headaches.   patient's had abdominal pain with nausea and vomiting since last night. No diarrhea. Last bowel movement was last night. She's a known umbilical hernia at the site of her previous operations. No fevers. She's had previous surgery for diverticulitis with colostomy and colostomy reversal. No hematemesis. She's not had pains like this before. Chest pain. Pain is dull. Hernias no different than normal.  Past Medical History  Diagnosis Date  . Arthritis   . Diverticulitis     Past Surgical History  Procedure Date  . Tonsillectomy     1958  . Oophorectomy     1981  . Abdominal hysterectomy 1990  . Cesarean section 1985, 1988  . Cholecystectomy 1986  . Colostomy   . Colostomy closure     abd diverticulitis with colostomy, reversal 2007- 2008    History reviewed. No pertinent family history.  History  Substance Use Topics  . Smoking status: Never Smoker   . Smokeless tobacco: Not on file  . Alcohol Use: 0.6 oz/week    1 Glasses of wine per week    OB History    Grav Para Term Preterm Abortions TAB SAB Ect Mult Living                  Review of Systems  Constitutional: Negative for activity change and  appetite change.  HENT: Negative for neck stiffness.   Eyes: Negative for pain.  Respiratory: Negative for chest tightness and shortness of breath.   Cardiovascular: Negative for chest pain and leg swelling.  Gastrointestinal: Positive for nausea, vomiting and abdominal pain. Negative for diarrhea.  Genitourinary: Negative for flank pain.  Musculoskeletal: Negative for back pain.  Skin: Negative for rash.  Neurological: Negative for weakness, numbness and headaches.  Psychiatric/Behavioral: Negative for behavioral problems.    Allergies  Levsin  Home Medications   Current Outpatient Rx  Name Route Sig Dispense Refill  . FEXOFENADINE-PSEUDOEPHED ER 180-240 MG PO TB24 Oral Take 1 tablet by mouth daily as needed. Seasonal allergies     . IBUPROFEN 200 MG PO TABS Oral Take 200 mg by mouth every 6 (six) hours as needed. For pain      BP 171/78  Pulse 88  Temp 98.2 F (36.8 C) (Oral)  Resp 17  SpO2 96%  Physical Exam  Nursing note and vitals reviewed. Constitutional: She is oriented to person, place, and time. She appears well-developed and well-nourished.       Patient is obese  HENT:  Head: Normocephalic and atraumatic.  Eyes: EOM are normal. Pupils are equal, round, and reactive to light.  Neck: Normal range of motion. Neck supple.  Cardiovascular: Normal rate, regular rhythm and normal heart sounds.   No murmur heard. Pulmonary/Chest: Effort normal and breath  sounds normal. No respiratory distress. She has no wheezes. She has no rales.  Abdominal: Soft. Bowel sounds are normal. She exhibits no distension. There is tenderness. There is no rebound and no guarding.       Left upper quadrant to left mid tenderness. Scar from previous midline surgery. Previous left lower quadrant colostomy scar. Reducible inferior umbilical hernia.  Musculoskeletal: Normal range of motion.  Neurological: She is alert and oriented to person, place, and time. No cranial nerve deficit.  Skin: Skin  is warm and dry.  Psychiatric: She has a normal mood and affect. Her speech is normal.    ED Course  Procedures (including critical care time)  Labs Reviewed  CBC - Abnormal; Notable for the following:    WBC 12.5 (*)     RBC 5.20 (*)     Hemoglobin 15.8 (*)     All other components within normal limits  COMPREHENSIVE METABOLIC PANEL - Abnormal; Notable for the following:    Glucose, Bld 152 (*)     BUN 25 (*)     Calcium 11.3 (*)     Total Protein 8.7 (*)     AST 67 (*)     ALT 83 (*)     Alkaline Phosphatase 126 (*)     GFR calc non Af Amer 73 (*)     GFR calc Af Amer 85 (*)     All other components within normal limits  LIPASE, BLOOD  URINALYSIS, ROUTINE W REFLEX MICROSCOPIC   Ct Abdomen Pelvis W Contrast  02/13/2012  *RADIOLOGY REPORT*  Clinical Data: Abdominal pain.  Known ventral hernia.  CT ABDOMEN AND PELVIS WITH CONTRAST  Technique:  Multidetector CT imaging of the abdomen and pelvis was performed following the standard protocol during bolus administration of intravenous contrast.  Contrast: OMNIPAQUE IOHEXOL 300 MG/ML  SOLN  Comparison: 05/05/2005.  Findings: Dependent atelectasis at the lung bases.  Liver is within normal limits.  Cholecystectomy.  The spleen normal.  Adrenal glands normal.  Pancreas and common bile duct appear normal. Stomach is within normal limits.  Small bowel obstruction is present.  There is a small left paramedian ventral hernia that is the source of obstruction.  There is a knuckle of small bowel with decompression of the distal bowel.  This probably involves jejunum.  There is a larger right paramedian ventral hernia containing a loop of transverse colon without evidence of obstruction.  Surgical anastomosis is present in the sigmoid colon near the rectum.  Hysterectomy.  Urinary bladder normal.  Normal renal enhancement and delayed excretion of contrast. Small right upper pole simple renal cysts, measuring under 1 cm. Postoperative changes of  the L3-L5 posterior lumbar interbody fusion.  IMPRESSION: 1.  Complete small bowel obstruction secondary to the herniated loop of bowel into the left paramedian ventral hernia (image 48 series 2).  No perforation or pneumatosis. 2.  Large right paramedian ventral hernia containing nonobstructed transverse colon. 3.  Cholecystectomy, hysterectomy and lumbar spine surgery. Partial sigmoidectomy.  Original Report Authenticated By: Andreas Newport, M.D.   Dg Abd Acute W/chest  02/13/2012  *RADIOLOGY REPORT*  Clinical Data: Abdominal pain, vomiting  ACUTE ABDOMEN SERIES (ABDOMEN 2 VIEW & CHEST 1 VIEW)  Comparison: No recent similar comparison prior exam  Findings: Cardiomediastinal silhouette is within normal limits. The lungs are clear. No pleural effusion.  No pneumothorax.  No acute osseous abnormality.  No free air beneath the diaphragms lumbar fusion hardware noted.  Multiple partially fluid-filled loops of  small bowel are noted over the mid abdomen, with suggestion of small bowel wall thickening. Colonic anastomotic chain sutures are noted.  Pelvic clips in place.  Right upper quadrant clips noted.  IMPRESSION: Mid to distal small bowel dilatation and wall thickening which may indicate obstruction although ischemia or other infiltrative type bowel wall abnormality could also have a similar appearance.  Original Report Authenticated By: Harrel Lemon, M.D.     1. Small bowel obstruction       MDM  Patient with nausea vomiting. Abdominal pain. Reducible hernia on exam. CT scan showed hernia with small bowel obstruction. Admitted to surgery  He     Juliet Rude. Rubin Payor, MD 02/13/12 [phone]

## 2012-02-13 NOTE — ED Notes (Signed)
Pt is currently in radiology. 

## 2012-02-13 NOTE — Anesthesia Preprocedure Evaluation (Signed)
Anesthesia Evaluation  Patient identified by MRN, date of birth, ID band Patient awake    Reviewed: Allergy & Precautions, H&P , NPO status , Patient's Chart, lab work & pertinent test results  Airway Mallampati: I TM Distance: >3 FB Neck ROM: full    Dental   Pulmonary          Cardiovascular Rhythm:regular Rate:Normal     Neuro/Psych    GI/Hepatic   Endo/Other    Renal/GU      Musculoskeletal   Abdominal   Peds  Hematology   Anesthesia Other Findings Back surgery Jan 2013  Reproductive/Obstetrics                           Anesthesia Physical Anesthesia Plan  ASA: II  Anesthesia Plan: General   Post-op Pain Management:    Induction: Intravenous  Airway Management Planned: Oral ETT  Additional Equipment:   Intra-op Plan:   Post-operative Plan: Extubation in OR  Informed Consent: I have reviewed the patients History and Physical, chart, labs and discussed the procedure including the risks, benefits and alternatives for the proposed anesthesia with the patient or authorized representative who has indicated his/her understanding and acceptance.     Plan Discussed with: CRNA, Anesthesiologist and Surgeon  Anesthesia Plan Comments:         Anesthesia Quick Evaluation

## 2012-02-13 NOTE — ED Notes (Signed)
Pt in radiology 

## 2012-02-13 NOTE — Transfer of Care (Signed)
Immediate Anesthesia Transfer of Care Note  Patient: Barbara Fletcher  Procedure(s) Performed: Procedure(s) (LRB): EXPLORATORY LAPAROTOMY (N/A) SMALL BOWEL RESECTION () HERNIA REPAIR VENTRAL ADULT ()  Patient Location: PACU  Anesthesia Type: General  Level of Consciousness: oriented, sedated, patient cooperative and responds to stimulation  Airway & Oxygen Therapy: Patient Spontanous Breathing and Patient connected to face mask oxygen  Post-op Assessment: Report given to PACU RN, Post -op Vital signs reviewed and stable, Patient moving all extremities and Patient moving all extremities X 4  Post vital signs: Reviewed and stable  Complications: No apparent anesthesia complications

## 2012-02-13 NOTE — ED Notes (Signed)
Pt was transferred to CDU

## 2012-02-13 NOTE — Op Note (Signed)
Preoperative diagnosis: Incarcerated ventral hernia with small bowel disruption Postoperative diagnoses: Same as above, perforated small bowel Procedure: #1 exploratory laparotomy with lysis of adhesions x30 minutes #2 small bowel resection with primary anastomosis #3 primary repair of ventral hernia #4 placement of vac sponge to subq tissue  Surgeon: Dr. Harden Mo Asst.: Dr. Wenda Low Anesthesia: Gen. Estimated blood loss: 200 cc Specimens: Small bowel to pathology Drains: None Consultations: None Sponge and needle count correct x2 at operation Disposition to recovery stable  Indications: This is a 61 year old female with multiple prior surgeries who presents with a incarcerated ventral hernia. This hernia been present for quite a bit of time but recently has gotten much worse. She comes in with a tender abdomen and a hernia I am not able to reduce. She also has a CT scan which showed that she is obstructed at this point. I discussed going to the operating room for laparotomy and repair of this hernia and release of her obstruction.  Procedure: After informed consent was obtained the patient was taken the operating room. She was administered 3 g of intravenous Unasyn. Sequential compression devices were placed on her legs prior to beginning. She was then placed under general anesthesia without complication. Her abdomen was prepped and draped in standard sterile surgical fashion. A surgical timeout was performed.  I made a midline incision and carried this down to her fascia. I grasped the fascia with Kocher clamps superiorly where she had not been operated on before it entered with a 10 blade. This was done without injury. I then opened the entirety of her incision after freeing a fair amount of adhesions. In total I spent about 30 minutes lysis of adhesions to clear off the anterior abdominal wall. Once I had done this I noted that she had a pretty large Swiss cheese defect in the  lower portion of her abdomen it came from her midline as well as went over towards her colostomy site. Eventually I was able to reduce most of this fairly easily but there was about a 1-1/2 cm hole there was a knuckle of small intestine that was incarcerated. This was released with some difficulty. A portion intestine appeared to be necrotic and I think that there may been a small hole in this area as well as there were some air bubbles that were present. I then used a GIA stapler to excise this portion did a primary stapled anastomosis. This was patent and hemostatic. I close the common enterotomy with a TA stapler. I then closed the mesenteric defect with 2-0 silk. This looked healthy and viable. I ran the remainder of the intestine and there was no other abnormality. This was clearly a point of the obstruction. Due to the fact that I had a perforation I did not place a permanent mesh. I closed the small hole that had the incarcerated bowel with novafil suture.  I told her previously that I thought that she was a good chance she would end up with a hernia again after this. I then made flaps in either direction. I then closed her with #1 Novafil in an interrupted fashion without really any tension at all. This tissue is not normal but was able to close this. I do think should develop a hernia again but we fixed the current problem from this particular operation. Hemostasis was observed. I then placed a VAC sponge to her subcutaneous tissue and this was functional upon completion. She was then extubated transferred recovery.

## 2012-02-13 NOTE — Anesthesia Procedure Notes (Signed)
Procedure Name: Intubation Date/Time: 02/13/2012 4:59 PM Performed by: Wray Kearns A Pre-anesthesia Checklist: Patient identified, Timeout performed, Emergency Drugs available, Suction available and Patient being monitored Patient Re-evaluated:Patient Re-evaluated prior to inductionOxygen Delivery Method: Circle system utilized Preoxygenation: Pre-oxygenation with 100% oxygen Intubation Type: IV induction, Rapid sequence and Cricoid Pressure applied Ventilation: Mask ventilation without difficulty Laryngoscope Size: Mac and 3 Grade View: Grade I Tube type: Oral Tube size: 7.5 mm Number of attempts: 1 Airway Equipment and Method: Stylet Placement Confirmation: ETT inserted through vocal cords under direct vision,  breath sounds checked- equal and bilateral and positive ETCO2 Secured at: 22 cm Tube secured with: Tape Dental Injury: Teeth and Oropharynx as per pre-operative assessment

## 2012-02-13 NOTE — Preoperative (Signed)
Beta Blockers   Reason not to administer Beta Blockers:Not Applicable 

## 2012-02-13 NOTE — H&P (Signed)
Barbara Fletcher is an 61 y.o. female.   Chief Complaint: abdominal pain, n/v consult from Dr. Rubin Payor HPI:  9 yof with history of what ends up being a tah/bso, cholecystectomy and then sigmoid colectomy for diverticulitis.  She also underwent colostomy closure.  She has had a hernia for some time now.  She recently underwent lumbar fusion.  Her ventral hernia has bothered her from time to time but usually reduces although this has been with some more difficulty lately.  Yesterday it began hurting much more and this was associated with large volume of nausea and emesis overnight. She is not eating. She complains of pain at the site of the hernia and is unable to reduce as she usually does.  She underwent evaluation in the er with ct that proves she has sbo associated with this hernia.  Past Medical History  Diagnosis Date  . Arthritis   . Diverticulitis     Past Surgical History  Procedure Date  . Tonsillectomy     1958  . Oophorectomy     1981  . Abdominal hysterectomy 1990  . Cesarean section 1985, 1988  . Cholecystectomy 1986  . Colostomy   . Colostomy closure     abd diverticulitis with colostomy, reversal 2007- 2008    History reviewed. No pertinent family history. Social History:  reports that she has never smoked. She does not have any smokeless tobacco history on file. She reports that she drinks about .6 ounces of alcohol per week. She reports that she does not use illicit drugs.  Allergies:  Allergies  Allergen Reactions  . Levsin (Hyoscyamine Sulfate) Hives and Shortness Of Breath     (Not in a hospital admission)  Results for orders placed during the hospital encounter of 02/13/12 (from the past 48 hour(s))  CBC     Status: Abnormal   Collection Time   02/13/12  9:41 AM      Component Value Range Comment   WBC 12.5 (*) 4.0 - 10.5 K/uL    RBC 5.20 (*) 3.87 - 5.11 MIL/uL    Hemoglobin 15.8 (*) 12.0 - 15.0 g/dL    HCT 16.1  09.6 - 04.5 %    MCV 87.9  78.0 -  100.0 fL    MCH 30.4  26.0 - 34.0 pg    MCHC 34.6  30.0 - 36.0 g/dL    RDW 40.9  81.1 - 91.4 %    Platelets 358  150 - 400 K/uL   COMPREHENSIVE METABOLIC PANEL     Status: Abnormal   Collection Time   02/13/12  9:41 AM      Component Value Range Comment   Sodium 138  135 - 145 mEq/L    Potassium 4.9  3.5 - 5.1 mEq/L    Chloride 97  96 - 112 mEq/L    CO2 25  19 - 32 mEq/L    Glucose, Bld 152 (*) 70 - 99 mg/dL    BUN 25 (*) 6 - 23 mg/dL    Creatinine, Ser 7.82  0.50 - 1.10 mg/dL    Calcium 95.6 (*) 8.4 - 10.5 mg/dL    Total Protein 8.7 (*) 6.0 - 8.3 g/dL    Albumin 4.4  3.5 - 5.2 g/dL    AST 67 (*) 0 - 37 U/L    ALT 83 (*) 0 - 35 U/L    Alkaline Phosphatase 126 (*) 39 - 117 U/L    Total Bilirubin 0.5  0.3 - 1.2 mg/dL  GFR calc non Af Amer 73 (*) >90 mL/min    GFR calc Af Amer 85 (*) >90 mL/min   LIPASE, BLOOD     Status: Normal   Collection Time   02/13/12  9:41 AM      Component Value Range Comment   Lipase 28  11 - 59 U/L     Review of Systems  Constitutional: Negative for fever and weight loss.  Gastrointestinal: Positive for nausea, vomiting and abdominal pain. Negative for diarrhea, constipation and blood in stool.    Blood pressure 165/86, pulse 88, temperature 98.4 F (36.9 C), temperature source Oral, resp. rate 16, SpO2 96.00%. Physical Exam  Vitals reviewed. Constitutional: She appears well-developed and well-nourished.  Eyes: No scleral icterus.  Cardiovascular: Normal rate, regular rhythm and normal heart sounds.   Respiratory: Effort normal and breath sounds normal. She has no wheezes. She has no rales.  GI: Soft. There is tenderness (left paramedian at site of hernia which I am unable to completely reduce, there is hard piece of intestine present) in the left lower quadrant. A hernia is present.       Assessment/Plan Incarcerated ventral hernia with obstruction I cannot really reduce this and there is tender hard mass associated with this.  She has sbo  that appears related to this as well.  We discussed going to operating room asap.   Risks include bleeding, infection, recurrent hernia, inability to fix hernia, anastomotic leak, colostomy, open wound, mech ventilation possibly prolonged, pna, uti, dvt/pe, mi, arrythmia. She and her husband all understand this and we will proceed as soon as possible.    Barbara Fletcher 02/13/2012, 3:44 PM

## 2012-02-13 NOTE — ED Notes (Signed)
Pt back in room from radiology.

## 2012-02-13 NOTE — ED Notes (Signed)
Pt complaining of abdominal pain around the area where her hernia is associated with nausea and vomiting that started last night. Denies diarrhea. sts LBM was last night and was normal

## 2012-02-13 NOTE — Transfer of Care (Signed)
Immediate Anesthesia Transfer of Care Note  Patient: Barbara Fletcher  Procedure(s) Performed: Procedure(s) (LRB): EXPLORATORY LAPAROTOMY (N/A) SMALL BOWEL RESECTION () HERNIA REPAIR VENTRAL ADULT ()  Patient Location: PACU  Anesthesia Type: General  Level of Consciousness: awake, sedated and patient cooperative  Airway & Oxygen Therapy: Patient Spontanous Breathing and Patient connected to face mask oxygen  Post-op Assessment: Report given to PACU RN and Post -op Vital signs reviewed and stable  Post vital signs: stable  Complications: No apparent anesthesia complications

## 2012-02-14 ENCOUNTER — Encounter (HOSPITAL_COMMUNITY): Payer: Self-pay | Admitting: General Surgery

## 2012-02-14 LAB — BASIC METABOLIC PANEL
BUN: 17 mg/dL (ref 6–23)
CO2: 28 mEq/L (ref 19–32)
Calcium: 9 mg/dL (ref 8.4–10.5)
Chloride: 104 mEq/L (ref 96–112)
Creatinine, Ser: 0.85 mg/dL (ref 0.50–1.10)
GFR calc Af Amer: 85 mL/min — ABNORMAL LOW (ref >90)
GFR calc non Af Amer: 73 mL/min — ABNORMAL LOW (ref >90)
Glucose, Bld: 136 mg/dL — ABNORMAL HIGH (ref 70–99)
Potassium: 4.5 mEq/L (ref 3.5–5.1)
Sodium: 140 mEq/L (ref 135–145)

## 2012-02-14 LAB — CBC
HCT: 37.2 % (ref 36.0–46.0)
Hemoglobin: 12.3 g/dL (ref 12.0–15.0)
MCH: 29.4 pg (ref 26.0–34.0)
MCHC: 33.1 g/dL (ref 30.0–36.0)
MCV: 89 fL (ref 78.0–100.0)
Platelets: 275 10*3/uL (ref 150–400)
RBC: 4.18 MIL/uL (ref 3.87–5.11)
RDW: 15.3 % (ref 11.5–15.5)
WBC: 8 10*3/uL (ref 4.0–10.5)

## 2012-02-14 MED ORDER — NYSTATIN 100000 UNIT/GM EX POWD
Freq: Two times a day (BID) | CUTANEOUS | Status: DC
  Administered 2012-02-14 – 2012-02-21 (×14): via TOPICAL
  Filled 2012-02-14: qty 15

## 2012-02-14 MED ORDER — ENOXAPARIN SODIUM 40 MG/0.4ML ~~LOC~~ SOLN
40.0000 mg | SUBCUTANEOUS | Status: DC
  Administered 2012-02-14 – 2012-02-22 (×9): 40 mg via SUBCUTANEOUS
  Filled 2012-02-14 (×10): qty 0.4

## 2012-02-14 NOTE — Progress Notes (Signed)
1 Day Post-Op  Subjective: Feels okay this am,still some post-op discomfort. no report of nausea or emesis post-op, NG drng bilious material total of 150cc in cannister post-op. Afebrile, WBC improved.  Objective: Vital signs in last 24 hours: Temp:  [97.8 F (36.6 C)-98.7 F (37.1 C)] 98.5 F (36.9 C) (06/17 0648) Pulse Rate:  [80-115] 106  (06/17 0648) Resp:  [16-24] 24  (06/17 0902) BP: (124-171)/(73-93) 143/81 mmHg (06/17 0648) SpO2:  [65 %-100 %] 96 % (06/17 0902) Weight:  [196 lb 12.8 oz (89.268 kg)] 196 lb 12.8 oz (89.268 kg) (06/16 2015)    Intake/Output from previous day: 06/16 0701 - 06/17 0700 In: 3126.7 [I.V.:3126.7] Out: 1350 [Urine:900; Emesis/NG output:150; Blood:300] Intake/Output this shift:    General appearance: alert, cooperative and mild distress Abdomen is soft, tender to palpation, hypoactive BS, no flatus, wound vac in place. No distention.  Lab Results:   Basename 02/14/12 0740 02/13/12 0941  WBC 8.0 12.5*  HGB 12.3 15.8*  HCT 37.2 45.7  PLT 275 358   BMET  Basename 02/14/12 0740 02/13/12 0941  NA 140 138  K 4.5 4.9  CL 104 97  CO2 28 25  GLUCOSE 136* 152*  BUN 17 25*  CREATININE 0.85 0.85  CALCIUM 9.0 11.3*   PT/INR No results found for this basename: LABPROT:2,INR:2 in the last 72 hours ABG No results found for this basename: PHART:2,PCO2:2,PO2:2,HCO3:2 in the last 72 hours  Studies/Results: Ct Abdomen Pelvis W Contrast  02/13/2012  *RADIOLOGY REPORT*  Clinical Data: Abdominal pain.  Known ventral hernia.  CT ABDOMEN AND PELVIS WITH CONTRAST  Technique:  Multidetector CT imaging of the abdomen and pelvis was performed following the standard protocol during bolus administration of intravenous contrast.  Contrast: OMNIPAQUE IOHEXOL 300 MG/ML  SOLN  Comparison: 05/05/2005.  Findings: Dependent atelectasis at the lung bases.  Liver is within normal limits.  Cholecystectomy.  The spleen normal.  Adrenal glands normal.  Pancreas and  common bile duct appear normal. Stomach is within normal limits.  Small bowel obstruction is present.  There is a small left paramedian ventral hernia that is the source of obstruction.  There is a knuckle of small bowel with decompression of the distal bowel.  This probably involves jejunum.  There is a larger right paramedian ventral hernia containing a loop of transverse colon without evidence of obstruction.  Surgical anastomosis is present in the sigmoid colon near the rectum.  Hysterectomy.  Urinary bladder normal.  Normal renal enhancement and delayed excretion of contrast. Small right upper pole simple renal cysts, measuring under 1 cm. Postoperative changes of the L3-L5 posterior lumbar interbody fusion.  IMPRESSION: 1.  Complete small bowel obstruction secondary to the herniated loop of bowel into the left paramedian ventral hernia (image 48 series 2).  No perforation or pneumatosis. 2.  Large right paramedian ventral hernia containing nonobstructed transverse colon. 3.  Cholecystectomy, hysterectomy and lumbar spine surgery. Partial sigmoidectomy.  Original Report Authenticated By: Andreas Newport, M.D.   Dg Abd Acute W/chest  02/13/2012  *RADIOLOGY REPORT*  Clinical Data: Abdominal pain, vomiting  ACUTE ABDOMEN SERIES (ABDOMEN 2 VIEW & CHEST 1 VIEW)  Comparison: No recent similar comparison prior exam  Findings: Cardiomediastinal silhouette is within normal limits. The lungs are clear. No pleural effusion.  No pneumothorax.  No acute osseous abnormality.  No free air beneath the diaphragms lumbar fusion hardware noted.  Multiple partially fluid-filled loops of small bowel are noted over the mid abdomen, with suggestion of small  bowel wall thickening. Colonic anastomotic chain sutures are noted.  Pelvic clips in place.  Right upper quadrant clips noted.  IMPRESSION: Mid to distal small bowel dilatation and wall thickening which may indicate obstruction although ischemia or other infiltrative type bowel  wall abnormality could also have a similar appearance.  Original Report Authenticated By: Harrel Lemon, M.D.    Anti-infectives: Anti-infectives     Start     Dose/Rate Route Frequency Ordered Stop   02/13/12 2030   Ampicillin-Sulbactam (UNASYN) 3 g in sodium chloride 0.9 % 100 mL IVPB        3 g 100 mL/hr over 60 Minutes Intravenous Every 6 hours 02/13/12 2016     02/13/12 1545   Ampicillin-Sulbactam (UNASYN) 3 g in sodium chloride 0.9 % 100 mL IVPB        3 g 100 mL/hr over 60 Minutes Intravenous  Once 02/13/12 1544 02/13/12 1700          Assessment/Plan: s/p Procedure(s) (LRB): EXPLORATORY LAPAROTOMY (N/A) SMALL BOWEL RESECTION () HERNIA REPAIR VENTRAL ADULT () 1.d/c foley 2.Continue all other orders. 3. Consider clamping NG tomorrow if output remains low and no nausea or emesis.  LOS: 1 day    Barbara Fletcher 02/14/2012

## 2012-02-14 NOTE — Progress Notes (Signed)
Stable one day post op. Abd soft. NG with bile

## 2012-02-14 NOTE — Care Management Note (Signed)
  Page 1 of 1   02/14/2012     10:41:02 AM   CARE MANAGEMENT NOTE 02/14/2012  Patient:  JANISHA, BUESO   Account Number:  [id number]  Date Initiated:  02/14/2012  Documentation initiated by:  Ronny Flurry  Subjective/Objective Assessment:   explor lap for SBO and Incarcerated ventral hernia     Action/Plan:   Anticipated DC Date:  02/18/2012   Anticipated DC Plan:  HOME W HOME HEALTH SERVICES         Choice offered to / List presented to:             Status of service:  In process, will continue to follow Medicare Important Message given?   (If response is "NO", the following Medicare IM given date fields will be blank) Date Medicare IM given:   Date Additional Medicare IM given:    Discharge Disposition:    Per UR Regulation:  Reviewed for med. necessity/level of care/duration of stay  If discussed at Long Length of Stay Meetings, dates discussed:    Comments:   02-14-12 Asked Riki Rusk with [facility] if patient will need wound Incarcerated ventral hernia VAC at home.  Left application forms for [employer] [facility] neg pressure system in shadow chart. Riki Rusk will follow up with DR Daphine Deutscher . Will need form signed and order for home health RN .  Ronny Flurry RN BSN [phone]

## 2012-02-14 NOTE — Anesthesia Postprocedure Evaluation (Signed)
  Anesthesia Post-op Note  Patient: Barbara Fletcher  Procedure(s) Performed: Procedure(s) (LRB): EXPLORATORY LAPAROTOMY (N/A) SMALL BOWEL RESECTION () HERNIA REPAIR VENTRAL ADULT ()  Patient Location: 5149  Anesthesia Type: General  Level of Consciousness: awake, alert  and oriented  Airway and Oxygen Therapy: Patient Spontanous Breathing and Patient connected to nasal cannula oxygen  Post-op Pain: mild  Post-op Assessment: Post-op Vital signs reviewed, Patient's Cardiovascular Status Stable, Respiratory Function Stable, Patent Airway, No signs of Nausea or vomiting and Pain level controlled  Post-op Vital Signs: Reviewed and stable  Complications: No apparent anesthesia complications

## 2012-02-15 NOTE — Progress Notes (Signed)
Feels OK, no nausea, no gas yet. Abd soft, quiet, Vac in place. Will leave NG till she passes gas

## 2012-02-15 NOTE — Progress Notes (Signed)
Patient ID: NORALEE DUTKO, female   DOB: 1950/09/03, 61 y.o.   MRN: [id number] 2 Days Post-Op  Subjective: Feels okay this am,still some post-op discomfort. no report of nausea or emesis post-op, NG drng bilious material. Remains afebrile.  Objective: Vital signs in last 24 hours: Temp:  [97.8 F (36.6 C)-98.9 F (37.2 C)] 98.9 F (37.2 C) (06/18 0958) Pulse Rate:  [93-103] 93  (06/18 0958) Resp:  [16-26] 20  (06/18 0958) BP: (126-173)/(70-77) 173/73 mmHg (06/18 0958) SpO2:  [94 %-96 %] 96 % (06/18 0958) Last BM Date: 02/13/12  Intake/Output from previous day: 06/17 0701 - 06/18 0700 In: 725 [I.V.:725] Out: 1550 [Urine:1450; Emesis/NG output:100] Intake/Output this shift: Total I/O In: -  Out: 500 [Urine:500]  General appearance: alert, cooperative and mild distress Abdomen is soft, tender to palpation, hypoactive BS, no flatus, wound vac in place. No distention. NG output last 24 hrs Lab Results:   Basename 02/14/12 0740 02/13/12 0941  WBC 8.0 12.5*  HGB 12.3 15.8*  HCT 37.2 45.7  PLT 275 358   BMET  Basename 02/14/12 0740 02/13/12 0941  NA 140 138  K 4.5 4.9  CL 104 97  CO2 28 25  GLUCOSE 136* 152*  BUN 17 25*  CREATININE 0.85 0.85  CALCIUM 9.0 11.3*   PT/INR No results found for this basename: LABPROT:2,INR:2 in the last 72 hours ABG No results found for this basename: PHART:2,PCO2:2,PO2:2,HCO3:2 in the last 72 hours  Studies/Results: Ct Abdomen Pelvis W Contrast  02/13/2012  *RADIOLOGY REPORT*  Clinical Data: Abdominal pain.  Known ventral hernia.  CT ABDOMEN AND PELVIS WITH CONTRAST  Technique:  Multidetector CT imaging of the abdomen and pelvis was performed following the standard protocol during bolus administration of intravenous contrast.  Contrast: OMNIPAQUE IOHEXOL 300 MG/ML  SOLN  Comparison: 05/05/2005.  Findings: Dependent atelectasis at the lung bases.  Liver is within normal limits.  Cholecystectomy.  The spleen normal.  Adrenal  glands normal.  Pancreas and common bile duct appear normal. Stomach is within normal limits.  Small bowel obstruction is present.  There is a small left paramedian ventral hernia that is the source of obstruction.  There is a knuckle of small bowel with decompression of the distal bowel.  This probably involves jejunum.  There is a larger right paramedian ventral hernia containing a loop of transverse colon without evidence of obstruction.  Surgical anastomosis is present in the sigmoid colon near the rectum.  Hysterectomy.  Urinary bladder normal.  Normal renal enhancement and delayed excretion of contrast. Small right upper pole simple renal cysts, measuring under 1 cm. Postoperative changes of the L3-L5 posterior lumbar interbody fusion.  IMPRESSION: 1.  Complete small bowel obstruction secondary to the herniated loop of bowel into the left paramedian ventral hernia (image 48 series 2).  No perforation or pneumatosis. 2.  Large right paramedian ventral hernia containing nonobstructed transverse colon. 3.  Cholecystectomy, hysterectomy and lumbar spine surgery. Partial sigmoidectomy.  Original Report Authenticated By: Andreas Newport, M.D.   Dg Abd Acute W/chest  02/13/2012  *RADIOLOGY REPORT*  Clinical Data: Abdominal pain, vomiting  ACUTE ABDOMEN SERIES (ABDOMEN 2 VIEW & CHEST 1 VIEW)  Comparison: No recent similar comparison prior exam  Findings: Cardiomediastinal silhouette is within normal limits. The lungs are clear. No pleural effusion.  No pneumothorax.  No acute osseous abnormality.  No free air beneath the diaphragms lumbar fusion hardware noted.  Multiple partially fluid-filled loops of small bowel are noted over the mid  abdomen, with suggestion of small bowel wall thickening. Colonic anastomotic chain sutures are noted.  Pelvic clips in place.  Right upper quadrant clips noted.  IMPRESSION: Mid to distal small bowel dilatation and wall thickening which may indicate obstruction although ischemia or  other infiltrative type bowel wall abnormality could also have a similar appearance.  Original Report Authenticated By: Harrel Lemon, M.D.    Anti-infectives: Anti-infectives     Start     Dose/Rate Route Frequency Ordered Stop   02/13/12 2030   Ampicillin-Sulbactam (UNASYN) 3 g in sodium chloride 0.9 % 100 mL IVPB        3 g 100 mL/hr over 60 Minutes Intravenous Every 6 hours 02/13/12 2016     02/13/12 1545   Ampicillin-Sulbactam (UNASYN) 3 g in sodium chloride 0.9 % 100 mL IVPB        3 g 100 mL/hr over 60 Minutes Intravenous  Once 02/13/12 1544 02/13/12 1700          Assessment/Plan: s/p Procedure(s) (LRB): EXPLORATORY LAPAROTOMY (N/A) SMALL BOWEL RESECTION () HERNIA REPAIR VENTRAL ADULT ()  Consider clamping NG, as output has remained low, will discuss with Dr. Jamey Ripa.  LOS: 2 days    Deepika Decatur 02/15/2012

## 2012-02-16 MED ORDER — METOPROLOL TARTRATE 1 MG/ML IV SOLN
10.0000 mg | Freq: Four times a day (QID) | INTRAVENOUS | Status: DC
  Administered 2012-02-17 – 2012-02-20 (×14): 10 mg via INTRAVENOUS
  Filled 2012-02-16 (×18): qty 10

## 2012-02-16 MED ORDER — METOPROLOL TARTRATE 1 MG/ML IV SOLN
10.0000 mg | Freq: Four times a day (QID) | INTRAVENOUS | Status: DC
  Filled 2012-02-16 (×2): qty 10

## 2012-02-16 MED ORDER — HYDRALAZINE HCL 20 MG/ML IJ SOLN
5.0000 mg | Freq: Four times a day (QID) | INTRAMUSCULAR | Status: DC | PRN
  Administered 2012-02-16 – 2012-02-20 (×4): 5 mg via INTRAVENOUS
  Filled 2012-02-16 (×3): qty 0.25

## 2012-02-16 MED ORDER — METOPROLOL TARTRATE 1 MG/ML IV SOLN
5.0000 mg | Freq: Four times a day (QID) | INTRAVENOUS | Status: DC
  Filled 2012-02-16 (×3): qty 5

## 2012-02-16 MED ORDER — METOPROLOL TARTRATE 1 MG/ML IV SOLN
5.0000 mg | Freq: Four times a day (QID) | INTRAVENOUS | Status: DC
  Administered 2012-02-16: 5 mg via INTRAVENOUS
  Filled 2012-02-16 (×3): qty 5

## 2012-02-16 MED ORDER — METOPROLOL TARTRATE 1 MG/ML IV SOLN
10.0000 mg | Freq: Four times a day (QID) | INTRAVENOUS | Status: DC
  Administered 2012-02-16: 10 mg via INTRAVENOUS

## 2012-02-16 NOTE — Progress Notes (Signed)
Patient ID: Barbara Fletcher, female   DOB: Dec 13, 1950, 61 y.o.   MRN: [id number] Patient ID: Barbara Fletcher, female   DOB: 1950/12/26, 61 y.o.   MRN: [id number] 3 Days Post-Op  Subjective: Feels okay this am,still some post-op discomfort. no report of nausea or emesis post-op, NG drng bilious material. Remains afebrile.  Objective: Vital signs in last 24 hours: Temp:  [97.9 F (36.6 C)-98.9 F (37.2 C)] 97.9 F (36.6 C) (06/19 0600) Pulse Rate:  [80-95] 80  (06/19 0600) Resp:  [16-22] 22  (06/19 0739) BP: (151-173)/(68-85) 160/68 mmHg (06/19 0600) SpO2:  [95 %-97 %] 97 % (06/19 0739) Last BM Date: 02/13/12  Intake/Output from previous day: 06/18 0701 - 06/19 0700 In: 1605 [I.V.:1605] Out: 1300 [Urine:950; Emesis/NG output:350] Intake/Output this shift:    General appearance: alert, cooperative and mild distress Abdomen is soft, tender to palpation, hypoactive BS, no flatus, wound vac in place. No distention. No NV reported. NG output last 24 hrs 350 ml.  Lab Results:   Basename 02/14/12 0740 02/13/12 0941  WBC 8.0 12.5*  HGB 12.3 15.8*  HCT 37.2 45.7  PLT 275 358   BMET  Basename 02/14/12 0740 02/13/12 0941  NA 140 138  K 4.5 4.9  CL 104 97  CO2 28 25  GLUCOSE 136* 152*  BUN 17 25*  CREATININE 0.85 0.85  CALCIUM 9.0 11.3*   PT/INR No results found for this basename: LABPROT:2,INR:2 in the last 72 hours ABG No results found for this basename: PHART:2,PCO2:2,PO2:2,HCO3:2 in the last 72 hours  Studies/Results: No results found.  Anti-infectives: Anti-infectives     Start     Dose/Rate Route Frequency Ordered Stop   02/13/12 2030   Ampicillin-Sulbactam (UNASYN) 3 g in sodium chloride 0.9 % 100 mL IVPB        3 g 100 mL/hr over 60 Minutes Intravenous Every 6 hours 02/13/12 2016     02/13/12 1545   Ampicillin-Sulbactam (UNASYN) 3 g in sodium chloride 0.9 % 100 mL IVPB        3 g 100 mL/hr over 60 Minutes Intravenous  Once 02/13/12 1544 02/13/12 1700          Assessment/Plan: s/p Procedure(s) (LRB): EXPLORATORY LAPAROTOMY (N/A) SMALL BOWEL RESECTION () HERNIA REPAIR VENTRAL ADULT ()  1. Ambulate patient in hall with assistance. 2. Continue NG to LCS as no flatus as of yet.  LOS: 3 days    Barbara Fletcher 02/16/2012

## 2012-02-16 NOTE — Progress Notes (Signed)
Still no gas, abd soft. Hopefully tomorrow will see start of bowel function

## 2012-02-17 LAB — CBC
HCT: 32.5 % — ABNORMAL LOW (ref 36.0–46.0)
Hemoglobin: 10.9 g/dL — ABNORMAL LOW (ref 12.0–15.0)
MCH: 29.5 pg (ref 26.0–34.0)
MCHC: 33.5 g/dL (ref 30.0–36.0)
MCV: 87.8 fL (ref 78.0–100.0)
Platelets: 307 10*3/uL (ref 150–400)
RBC: 3.7 MIL/uL — ABNORMAL LOW (ref 3.87–5.11)
RDW: 14.2 % (ref 11.5–15.5)
WBC: 6.7 10*3/uL (ref 4.0–10.5)

## 2012-02-17 LAB — BASIC METABOLIC PANEL
BUN: 7 mg/dL (ref 6–23)
CO2: 23 mEq/L (ref 19–32)
Calcium: 8.9 mg/dL (ref 8.4–10.5)
Chloride: 100 mEq/L (ref 96–112)
Creatinine, Ser: 0.5 mg/dL (ref 0.50–1.10)
GFR calc Af Amer: >90 mL/min (ref >90)
GFR calc non Af Amer: >90 mL/min (ref >90)
Glucose, Bld: 73 mg/dL (ref 70–99)
Potassium: 2.8 mEq/L — ABNORMAL LOW (ref 3.5–5.1)
Sodium: 137 mEq/L (ref 135–145)

## 2012-02-17 MED ORDER — POTASSIUM CHLORIDE 10 MEQ/100ML IV SOLN
INTRAVENOUS | Status: AC
  Administered 2012-02-17: 10 meq via INTRAVENOUS
  Filled 2012-02-17: qty 100

## 2012-02-17 MED ORDER — POTASSIUM CHLORIDE 10 MEQ/100ML IV SOLN
10.0000 meq | INTRAVENOUS | Status: AC
  Administered 2012-02-17 (×4): 10 meq via INTRAVENOUS
  Filled 2012-02-17 (×2): qty 100

## 2012-02-17 NOTE — Progress Notes (Signed)
4 Days Post-Op  Subjective: No flatus or bm at this point, ambulating pain controlled  Objective: Vital signs in last 24 hours: Temp:  [97.6 F (36.4 C)-99 F (37.2 C)] 97.7 F (36.5 C) (06/20 0528) Pulse Rate:  [71-89] 75  (06/20 0528) Resp:  [12-22] 20  (06/20 0813) BP: (162-182)/(66-97) 163/74 mmHg (06/20 0528) SpO2:  [94 %-97 %] 97 % (06/20 0813) Last BM Date: 02/13/12  Intake/Output from previous day: 06/19 0701 - 06/20 0700 In: 3195 [I.V.:3195] Out: 1425 [Urine:925; Emesis/NG output:500] Intake/Output this shift: Total I/O In: -  Out: 700 [Urine:700]  General appearance: no distress GI: no bs, vac in place, approp tender, bilious ng output    Assessment/Plan: Pod #4 ex lap/sbr/primary ventral hernia repair 1. Continue pca 2. pulm toilet, oob 3. Continue ng, npo await expected ileus to resolve, she had incarcerated hernia with bowel obstruction and an extensive loa 4. Check labs today 5. Continue hydralazine for elevated bp, will need to be addressed as outpt also 6. lovenox scds   LOS: 4 days    [facility] 02/17/2012

## 2012-02-18 LAB — BASIC METABOLIC PANEL
BUN: 6 mg/dL (ref 6–23)
CO2: 23 mEq/L (ref 19–32)
Calcium: 9.2 mg/dL (ref 8.4–10.5)
Chloride: 102 mEq/L (ref 96–112)
Creatinine, Ser: 0.55 mg/dL (ref 0.50–1.10)
GFR calc Af Amer: >90 mL/min (ref >90)
GFR calc non Af Amer: >90 mL/min (ref >90)
Glucose, Bld: 75 mg/dL (ref 70–99)
Potassium: 3.1 mEq/L — ABNORMAL LOW (ref 3.5–5.1)
Sodium: 138 mEq/L (ref 135–145)

## 2012-02-18 MED ORDER — MORPHINE SULFATE 2 MG/ML IJ SOLN: 1.0000 mg | INTRAMUSCULAR | Status: DC | PRN

## 2012-02-18 MED ORDER — KCL IN DEXTROSE-NACL 10-5-0.45 MEQ/L-%-% IV SOLN
INTRAVENOUS | Status: DC
  Administered 2012-02-18 – 2012-02-21 (×5): via INTRAVENOUS
  Filled 2012-02-18 (×11): qty 1000

## 2012-02-18 MED ORDER — BISACODYL 10 MG RE SUPP: 10.0000 mg | Freq: Every day | RECTAL | Status: DC | PRN

## 2012-02-18 MED ORDER — POTASSIUM CHLORIDE 10 MEQ/100ML IV SOLN
10.0000 meq | INTRAVENOUS | Status: AC
  Administered 2012-02-18 (×4): 10 meq via INTRAVENOUS
  Filled 2012-02-18 (×4): qty 100

## 2012-02-18 NOTE — Progress Notes (Signed)
As per note of JD,PA. Since she was seen this morning she's had a spontaneous bowel movement. She is feeling better. Her abdomen is soft. I think we can do a trial of clamping her NG tube night with an idea of getting it out tomorrow if she tolerates that.

## 2012-02-18 NOTE — Progress Notes (Signed)
Patient ID: Barbara Fletcher, female   DOB: 1950/12/29, 61 y.o.   MRN: [id number] Patient ID: Barbara Fletcher, female   DOB: 03-27-1951, 61 y.o.   MRN: [id number] Patient ID: Barbara Fletcher, female   DOB: 28-Sep-1950, 61 y.o.   MRN: [id number] 5 Days Post-Op  Subjective: Feels okay this am,no report of nausea or emesis post-op, still no BM, NG drng bilious material. Remains afebrile.  Objective: Vital signs in last 24 hours: Temp:  [97.5 F (36.4 C)-98.6 F (37 C)] 97.9 F (36.6 C) (06/21 0865) Pulse Rate:  [65-73] 73  (06/21 0638) Resp:  [17-23] 18  (06/21 7846) BP: (153-182)/(71-90) 163/71 mmHg (06/21 0638) SpO2:  [94 %-98 %] 95 % (06/21 0638) Last BM Date: 02/13/12  Intake/Output from previous day: 06/20 0701 - 06/21 0700 In: 2837 [P.O.:600; I.V.:2237] Out: 3910 [Urine:3575; Emesis/NG output:260; Drains:75] Intake/Output this shift:    General appearance: awake,alert sitting up at bedside.  Abdomen is soft, tender to palpation, hypoactive BS, no flatus, wound vac changed; wound edges appear to be well granulated, no exudate,or odor noted. No abdominal distention. No NV reported. + BM following docusate suppository. Ng output 260 ml. Past 12 hours. BP remains elevated (may be due to chronic pain issues)  Lab Results:   Basename 02/17/12 1245  WBC 6.7  HGB 10.9*  HCT 32.5*  PLT 307   BMET  Basename 02/17/12 1245  NA 137  K 2.8*  CL 100  CO2 23  GLUCOSE 73  BUN 7  CREATININE 0.50  CALCIUM 8.9   PT/INR No results found for this basename: LABPROT:2,INR:2 in the last 72 hours ABG No results found for this basename: PHART:2,PCO2:2,PO2:2,HCO3:2 in the last 72 hours  Studies/Results: No results found.  Anti-infectives: Anti-infectives     Start     Dose/Rate Route Frequency Ordered Stop   02/13/12 2030   Ampicillin-Sulbactam (UNASYN) 3 g in sodium chloride 0.9 % 100 mL IVPB        3 g 100 mL/hr over 60 Minutes Intravenous Every 6 hours 02/13/12 2016     02/13/12 1545   Ampicillin-Sulbactam (UNASYN) 3 g in sodium chloride 0.9 % 100 mL IVPB        3 g 100 mL/hr over 60 Minutes Intravenous  Once 02/13/12 1544 02/13/12 1700          Assessment/Plan: s/p Procedure(s) (LRB): EXPLORATORY LAPAROTOMY (N/A) SMALL BOWEL RESECTION () HERNIA REPAIR VENTRAL ADULT ()  1. Ambulate patient in hall with assistance. 2. Continue NG to LCS as no flatus as of yet. 3. Dulcolax suppository 4. Will recheck K+ this am, replete if needed; and add K+ to IVF. 5. Cont to monitor BP as elevation may be due to chronic pain issues.  LOS: 5 days    Talina Pleitez 02/18/2012

## 2012-02-19 NOTE — Progress Notes (Signed)
6 Days Post-Op  Subjective: NGT clamped.  BM and flatus.  No nausea.  Objective: Vital signs in last 24 hours: Temp:  [97.5 F (36.4 C)-98.4 F (36.9 C)] 97.5 F (36.4 C) (06/22 0534) Pulse Rate:  [63-79] 74  (06/22 0534) Resp:  [18-21] 18  (06/22 0534) BP: (154-174)/(63-80) 174/71 mmHg (06/22 0534) SpO2:  [95 %-99 %] 98 % (06/22 0534) Last BM Date: 02/18/12  Intake/Output from previous day: 06/21 0701 - 06/22 0700 In: 2274 [I.V.:2274] Out: 4371 [Urine:2300; Emesis/NG output:2070; Stool:1] Intake/Output this shift:    Incision/Wound:soft wound vac in place.  BS present  Lab Results:   Basename 02/17/12 1245  WBC 6.7  HGB 10.9*  HCT 32.5*  PLT 307   BMET  Basename 02/18/12 0916 02/17/12 1245  NA 138 137  K 3.1* 2.8*  CL 102 100  CO2 23 23  GLUCOSE 75 73  BUN 6 7  CREATININE 0.55 0.50  CALCIUM 9.2 8.9   PT/INR No results found for this basename: LABPROT:2,INR:2 in the last 72 hours ABG No results found for this basename: PHART:2,PCO2:2,PO2:2,HCO3:2 in the last 72 hours  Studies/Results: No results found.  Anti-infectives: Anti-infectives     Start     Dose/Rate Route Frequency Ordered Stop   02/13/12 2030   Ampicillin-Sulbactam (UNASYN) 3 g in sodium chloride 0.9 % 100 mL IVPB        3 g 100 mL/hr over 60 Minutes Intravenous Every 6 hours 02/13/12 2016     02/13/12 1545   Ampicillin-Sulbactam (UNASYN) 3 g in sodium chloride 0.9 % 100 mL IVPB        3 g 100 mL/hr over 60 Minutes Intravenous  Once 02/13/12 1544 02/13/12 1700          Assessment/Plan: s/p Procedure(s) (LRB): EXPLORATORY LAPAROTOMY (N/Fletcher) SMALL BOWEL RESECTION () HERNIA REPAIR VENTRAL ADULT () Ileus resolving.  D/c NGT.  SIPS and chips  LOS: 6 days    Barbara Fletcher. 02/19/2012

## 2012-02-20 MED ORDER — METOPROLOL TARTRATE 50 MG PO TABS
50.0000 mg | ORAL_TABLET | Freq: Two times a day (BID) | ORAL | Status: DC
  Administered 2012-02-20 – 2012-02-23 (×7): 50 mg via ORAL
  Filled 2012-02-20 (×8): qty 1

## 2012-02-20 NOTE — Progress Notes (Signed)
7 Days Post-Op  Subjective: NGT out.  No nausea or vomiting.  Objective: Vital signs in last 24 hours: Temp:  [98 F (36.7 C)-98.8 F (37.1 C)] 98.6 F (37 C) (06/23 0520) Pulse Rate:  [69-81] 81  (06/23 0520) Resp:  [18] 18  (06/23 0520) BP: (145-178)/(65-75) 145/65 mmHg (06/23 0520) SpO2:  [94 %-97 %] 95 % (06/23 0520) Last BM Date: 02/19/12  Intake/Output from previous day: 06/22 0701 - 06/23 0700 In: 868 [I.V.:667; IV Piggyback:200] Out: 2762 [Urine:2650; Emesis/NG output:100; Drains:12] Intake/Output this shift:    Incision/Wound:vac in place non distended.   Lab Results:   [facility] 02/17/12 1245  WBC 6.7  HGB 10.9*  HCT 32.5*  PLT 307   BMET  Basename 02/18/12 0916 02/17/12 1245  NA 138 137  K 3.1* 2.8*  CL 102 100  CO2 23 23  GLUCOSE 75 73  BUN 6 7  CREATININE 0.55 0.50  CALCIUM 9.2 8.9   PT/INR No results found for this basename: LABPROT:2,INR:2 in the last 72 hours ABG No results found for this basename: PHART:2,PCO2:2,PO2:2,HCO3:2 in the last 72 hours  Studies/Results: No results found.  Anti-infectives: Anti-infectives     Start     Dose/Rate Route Frequency Ordered Stop   02/13/12 2030   Ampicillin-Sulbactam (UNASYN) 3 g in sodium chloride 0.9 % 100 mL IVPB        3 g 100 mL/hr over 60 Minutes Intravenous Every 6 hours 02/13/12 2016     02/13/12 1545   Ampicillin-Sulbactam (UNASYN) 3 g in sodium chloride 0.9 % 100 mL IVPB        3 g 100 mL/hr over 60 Minutes Intravenous  Once 02/13/12 1544 02/13/12 1700          Assessment/Plan: s/p Procedure(s) (LRB): EXPLORATORY LAPAROTOMY (N/A) SMALL BOWEL RESECTION () HERNIA REPAIR VENTRAL ADULT () Increase diet. Ileus resolving  LOS: 7 days    Lalaine Overstreet A. 02/20/2012

## 2012-02-21 MED ORDER — POTASSIUM CHLORIDE 10 MEQ/100ML IV SOLN
10.0000 meq | Freq: Once | INTRAVENOUS | Status: AC
  Administered 2012-02-21: 10 meq via INTRAVENOUS
  Filled 2012-02-21: qty 100

## 2012-02-21 MED ORDER — POTASSIUM CHLORIDE 10 MEQ/100ML IV SOLN
10.0000 meq | INTRAVENOUS | Status: AC
  Administered 2012-02-21 (×3): 10 meq via INTRAVENOUS
  Filled 2012-02-21 (×3): qty 100

## 2012-02-21 MED ORDER — KCL IN DEXTROSE-NACL 20-5-0.45 MEQ/L-%-% IV SOLN
INTRAVENOUS | Status: DC
  Administered 2012-02-21 – 2012-02-23 (×3): via INTRAVENOUS
  Filled 2012-02-21 (×6): qty 1000

## 2012-02-21 NOTE — Progress Notes (Signed)
8 Days Post-Op  Subjective: Patient reports good PO intake of clear liquid diet, no nausea/vomiting/abdominal pain.  +BM.    Objective: Vital signs in last 24 hours: Temp:  [97.9 F (36.6 C)-99.2 F (37.3 C)] 97.9 F (36.6 C) (06/24 0616) Pulse Rate:  [66-78] 77  (06/24 0616) Resp:  [18] 18  (06/24 0616) BP: (146-169)/(66-80) 148/71 mmHg (06/24 0616) SpO2:  [95 %-96 %] 95 % (06/24 0616) Last BM Date: 02/20/12  Intake/Output from previous day: 06/23 0701 - 06/24 0700 In: 720 [P.O.:720] Out: 2350 [Urine:2350] Intake/Output this shift:   General appearance: alert, cooperative and no distress Head: Normocephalic, without obvious abnormality, atraumatic Lungs: clear to auscultation bilaterally Heart: regular rate and rhythm, S1, S2 normal, no murmur, click, rub or gallop Abdomen: +BS, soft, minimal tenderness to palpation Extremities: extremities normal, atraumatic, no cyanosis or edema Incision/Wound:vac in place non distended.   Lab Results:  BMET  Basename 02/18/12 0916  NA 138  K 3.1*  CL 102  CO2 23  GLUCOSE 75  BUN 6  CREATININE 0.55  CALCIUM 9.2   Anti-infectives: Anti-infectives     Start     Dose/Rate Route Frequency Ordered Stop   02/13/12 2030   Ampicillin-Sulbactam (UNASYN) 3 g in sodium chloride 0.9 % 100 mL IVPB        3 g 100 mL/hr over 60 Minutes Intravenous Every 6 hours 02/13/12 2016     02/13/12 1545   Ampicillin-Sulbactam (UNASYN) 3 g in sodium chloride 0.9 % 100 mL IVPB        3 g 100 mL/hr over 60 Minutes Intravenous  Once 02/13/12 1544 02/13/12 1700          Assessment/Plan: s/p Procedure(s) (LRB): EXPLORATORY LAPAROTOMY (N/A) SMALL BOWEL RESECTION () HERNIA REPAIR VENTRAL ADULT ()  1. Continue to advance diet- full liquids this afternoon.  2. Continue ambulation.  3. Likely change wound vac today 4. S/p 8 days of Unasyn, consider discontinue antibiotics.  5. Elevated BP- improved somewhat in PO lopressor, will continue to  monitor.  6. Hypokalemia- will replace with IV runs. Re-check bmet in am.    LOS: 8 days    Latavious Bitter 02/21/2012

## 2012-02-21 NOTE — Progress Notes (Signed)
Agree Advance diet VAC change  Barbara Fletcher. Corliss Skains, MD, [facility]  02/21/2012 2:50 PM

## 2012-02-22 LAB — BASIC METABOLIC PANEL
BUN: 4 mg/dL — ABNORMAL LOW (ref 6–23)
CO2: 27 mEq/L (ref 19–32)
Calcium: 9.6 mg/dL (ref 8.4–10.5)
Chloride: 103 mEq/L (ref 96–112)
Creatinine, Ser: 0.64 mg/dL (ref 0.50–1.10)
GFR calc Af Amer: >90 mL/min (ref >90)
GFR calc non Af Amer: >90 mL/min (ref >90)
Glucose, Bld: 117 mg/dL — ABNORMAL HIGH (ref 70–99)
Potassium: 3.3 mEq/L — ABNORMAL LOW (ref 3.5–5.1)
Sodium: 141 mEq/L (ref 135–145)

## 2012-02-22 MED ORDER — LISINOPRIL 20 MG PO TABS
20.0000 mg | ORAL_TABLET | Freq: Every day | ORAL | Status: DC
  Administered 2012-02-22 – 2012-02-23 (×2): 20 mg via ORAL
  Filled 2012-02-22 (×2): qty 1

## 2012-02-22 NOTE — Progress Notes (Signed)
Feeling better. Advance diet Check wound tomorrow - VAC vs wet to dry  Wilmon Arms. Corliss Skains, MD, [facility] Surgery  02/22/2012 10:27 AM

## 2012-02-22 NOTE — Progress Notes (Signed)
9 Days Post-Op  Subjective: Patient reports no problems with full liquid diet, no nausea/vomiting/abdominal pain.  +BM.  Ready to try "real food."   Objective: Vital signs in last 24 hours: Temp:  [98.1 F (36.7 C)-99.2 F (37.3 C)] 98.1 F (36.7 C) (06/25 0623) Pulse Rate:  [72-82] 72  (06/25 0623) Resp:  [18-20] 18  (06/25 0623) BP: (142-151)/(60-81) 151/81 mmHg (06/25 0623) SpO2:  [94 %-98 %] 96 % (06/25 0623) Last BM Date: 02/21/12  Intake/Output from previous day: 06/24 0701 - 06/25 0700 In: 700 [I.V.:600; IV Piggyback:100] Out: 2225 [Urine:2225] Intake/Output this shift:   General appearance: alert, cooperative and no distress Head: Normocephalic, without obvious abnormality, atraumatic Lungs: clear to auscultation bilaterally Heart: regular rate and rhythm, S1, S2 normal, no murmur, click, rub or gallop Abdomen: +BS, soft, minimal tenderness to palpation Extremities: extremities normal, atraumatic, no cyanosis or edema Incision/Wound:vac in place non distended.   No New Lab Studies.   Assessment/Plan: s/p Procedure(s) (LRB): EXPLORATORY LAPAROTOMY (N/A) SMALL BOWEL RESECTION () HERNIA REPAIR VENTRAL ADULT ()  1. Advance diet to low residue  2. Continue ambulation.  3. Will plan to take Vac off tomorrow, decide if she needs to go home with wound vac or wet to dry dressings.   4. Elevated BP- still slightly elevated on PO lopressor.  Will add Lisinopril 20.   5. FEN/GI- will d/c IVF as patient tolerating food, BMET today, will repeat tomorrow.  Continue Protonix, Zofran PRN.  6. Disposition- anticipate d/c home tomorrow.   LOS: 9 days    Sayge Salvato 02/22/2012

## 2012-02-23 LAB — COMPREHENSIVE METABOLIC PANEL
ALT: 19 U/L (ref 0–35)
AST: 18 U/L (ref 0–37)
Albumin: 2.7 g/dL — ABNORMAL LOW (ref 3.5–5.2)
Alkaline Phosphatase: 81 U/L (ref 39–117)
BUN: 6 mg/dL (ref 6–23)
CO2: 26 mEq/L (ref 19–32)
Calcium: 9.1 mg/dL (ref 8.4–10.5)
Chloride: 106 mEq/L (ref 96–112)
Creatinine, Ser: 0.61 mg/dL (ref 0.50–1.10)
GFR calc Af Amer: >90 mL/min (ref >90)
GFR calc non Af Amer: >90 mL/min (ref >90)
Glucose, Bld: 107 mg/dL — ABNORMAL HIGH (ref 70–99)
Potassium: 3.6 mEq/L (ref 3.5–5.1)
Sodium: 142 mEq/L (ref 135–145)
Total Bilirubin: 0.2 mg/dL — ABNORMAL LOW (ref 0.3–1.2)
Total Protein: 6 g/dL (ref 6.0–8.3)

## 2012-02-23 MED ORDER — PANTOPRAZOLE SODIUM 40 MG PO TBEC: 40.0000 mg | DELAYED_RELEASE_TABLET | Freq: Every day | ORAL | Status: DC

## 2012-02-23 MED ORDER — METOPROLOL TARTRATE 50 MG PO TABS: 50.0000 mg | ORAL_TABLET | Freq: Two times a day (BID) | ORAL | Status: DC

## 2012-02-23 MED ORDER — ACETAMINOPHEN 325 MG PO TABS: 650.0000 mg | ORAL_TABLET | Freq: Four times a day (QID) | ORAL | Status: AC | PRN

## 2012-02-23 MED ORDER — LISINOPRIL 20 MG PO TABS: 20.0000 mg | ORAL_TABLET | Freq: Every day | ORAL | Status: DC

## 2012-02-23 NOTE — Progress Notes (Signed)
Discharge with [facility] RN wound care.  Barbara Fletcher. Corliss Skains, MD, [facility]  02/23/2012 12:30 PM

## 2012-02-23 NOTE — Discharge Instructions (Signed)
Low Fiber and Residue Restricted Diet A low fiber diet restricts foods that contain carbohydrates that are not digested in the small intestine. A diet containing about 10 g of fiber is considered low fiber. The diet needs to be individualized to suit patient tolerances and preferences and to avoid unnecessary restrictions. Generally, the foods emphasized in a low fiber diet have no skins or seeds. They may have been processed to remove bran, germ, or husks. Cooking may not necessarily eliminate the fiber. Cooking may, in fact, enable a greater quantity of fiber to be consumed in a lesser volume. Legumes and nuts are also restricted. The term low residue has also been used to describe low fiber diets, although the two are not the same. Residue refers to any substance that adds to bowel (colonic) contents, such as sloughed cells and intestinal bacteria, in addition to fiber. Residue-containing foods, prunes and prune juice, milk, and connective tissue from meats may also need to be eliminated. It is important to eliminate these foods during sudden (acute) attacks of inflammatory bowel disease, when there is a partial obstruction due to another reason, or when minimal fecal output is desired. When these problems are gone, a more normal diet may be used. PURPOSE  Prevent blockage of a partially obstructed or narrowed gastrointestinal tract.   Reduce stool weight and volume.   Slow the movement of waste.  WHEN IS THIS DIET USED?  Acute phase of Crohn's disease, ulcerative colitis, regional enteritis, or diverticulitis.   Narrowing (stenosis) of intestinal or esophageal tubes (lumina).   Transitional diet following surgery, injury (trauma), or illness.  ADEQUACY This diet is nutritionally adequate based on individual food choices according to the Recommended Dietary Allowances of the National Research Council. CHOOSING FOODS Check labels, especially on foods from the starch list. Often, dietary fiber  content is listed with the Nutrition Facts panel.  Breads and Starches  Allowed: White, French, and pita breads, plain rolls, buns, or sweet rolls, doughnuts, waffles, pancakes, bagels. Plain muffins, sweet breads, biscuits, matzoth. Flour. Soda, saltine, or graham crackers. Pretzels, rusks, melba toast, zwieback. Cooked cereals: cornmeal, farina, cream cereals. Dry cereals: refined corn, wheat, rice, and oat cereals (check label). Potatoes prepared any way without skins, refined macaroni, spaghetti, noodles, refined rice.   Avoid: Bread, rolls, or crackers made with whole-wheat, multigrains, rye, bran seeds, nuts, or coconut. Corn tortillas, table-shells. Corn chips, tortilla chips. Cereals containing whole-grains, multigrains, bran, coconut, nuts, or raisins. Cooked or dry oatmeal. Coarse wheat cereals, granola. Cereals advertised as "high fiber." Potato skins. Whole-grain pasta, wild or brown rice. Popcorn.  Vegetables  Allowed:  Strained tomato and vegetable juices. Fresh: tender lettuce, cucumber, cabbage, spinach, bean sprouts. Cooked, canned: asparagus, bean sprouts, cut green or wax beans, cauliflower, pumpkin, beets, mushrooms, olives, spinach, yellow squash, tomato, tomato sauce (no seeds), zucchini (peeled), turnips. Canned sweet potatoes. Small amounts of celery, onion, radish, and green pepper may be used. Keep servings limited to  cup.   Avoid: Fresh, cooked, or canned: artichokes, baked beans, beet greens, broccoli, Brussels sprouts, French-style green beans, corn, kale, legumes, peas, sweet potatoes. Cooked: green or red cabbage, spinach. Avoid large servings of any vegetables.  Fruit  Allowed:  All fruit juices except prune juice. Cooked or canned: apricots applesauce, cantaloupe, cherries, grapefruit, grapes, kiwi, mandarin oranges, peaches, pears, fruit cocktail, pineapple, plums, watermelon. Fresh: banana, grapes, cantaloupe, avocado, cherries, pineapple, grapefruit, kiwi,  nectarines, peaches, oranges, blueberries, plums. Keep servings limited to  cup or 1 piece.     Avoid: Fresh: apple with or without skin, apricots, mango, pears, raspberries, strawberries. Prune juice, stewed or dried prunes. Dried fruits, raisins, dates. Avoid large servings of all fresh fruits.  Meat and Meat Substitutes  Allowed:  Ground or well-cooked tender beef, ham, veal, lamb, pork, or poultry. Eggs, plain cheese. Fish, oysters, shrimp, lobster, other seafood. Liver, organ meats.   Avoid: Tough, fibrous meats with gristle. Peanut butter, smooth or chunky. Cheese with seeds, nuts, or other foods not allowed. Nuts, seeds, legumes, dried peas, beans, lentils.  Milk  Allowed:  All milk products except those not allowed. Milk and milk product consumption should be minimal when low residue is desired.   Avoid: Yogurt that contains nuts or seeds.  Soups and Combination Foods  Allowed:  Bouillon, broth, or cream soups made from allowed foods. Any strained soup. Casseroles or mixed dishes made with allowed foods.   Avoid: Soups made from vegetables that are not allowed or that contain other foods not allowed.  Desserts and Sweets  Allowed:  Plain cakes and cookies, pie made with allowed fruit, pudding, custard, cream pie. Gelatin, fruit, ice, sherbet, frozen ice pops. Ice cream, ice milk without nuts. Plain hard candy, honey, jelly, molasses, syrup, sugar, chocolate syrup, gumdrops, marshmallows.   Avoid: Desserts, cookies, or candies that contain nuts, peanut butter, or dried fruits. Jams, preserves with seeds, marmalade.  Fats and Oils  Allowed:  Margarine, butter, cream, mayonnaise, salad oils, plain salad dressings made from allowed foods. Plain gravy, crisp bacon without rind.   Avoid: Seeds, nuts, olives. Avocados.  Beverages  Allowed:  All, except those listed to avoid.   Avoid: Fruit juices with high pulp, prune juice.  Condiments  Allowed:  Ketchup, mustard, horseradish,  vinegar, cream sauce, cheese sauce, cocoa powder. Spices in moderation: allspice, basil, bay leaves, celery powder or leaves, cinnamon, cumin powder, curry powder, ginger, mace, marjoram, onion or garlic powder, oregano, paprika, parsley flakes, ground pepper, rosemary, sage, savory, tarragon, thyme, turmeric.   Avoid: Coconut, pickles.  SAMPLE MEAL PLAN The following menu is provided as a sample. Your daily menu plans will vary. Be sure to include a minimum of the following each day in order to provide essential nutrients for the adult:  Starch/Bread/Cereal Group, 6 servings.   Fruit/Vegetable Group, 5 servings.   Meat/Meat Substitute Group, 2 servings.   Milk/Milk Substitute Group, 2 servings.  A serving is equal to  cup for fruits, vegetables, and cooked cereals or 1 piece for foods such as a piece of bread, 1 orange, or 1 apple. For dry cereals and crackers, use serving sizes listed on the label. Combination foods may count as full or partial servings from various food groups. Fats, desserts, and sweets may be added to the meal plan after the requirements for essential nutrients are met. SAMPLE MENU Breakfast   cup orange juice.   1 boiled egg.   1 slice white toast.   Margarine.    cup cornflakes.   1 cup milk.   Beverage.  Lunch   cup chicken noodle soup.   2 to 3 oz sliced roast beef.   2 slices seedless rye bread.   Mayonnaise.    cup tomato juice.   1 small banana.   Beverage.  Dinner  3 oz baked chicken.    cup scalloped potatoes.    cup cooked beets.   White dinner roll.   Margarine.    cup canned peaches.   Beverage.  Document Released: 02/05/2002 Document Revised: 08/05/2011   Document Reviewed: 07/19/2011 [facility] Patient Information 2012 [place], [place].Intestinal Obstruction Care After Please read the instructions outlined below. Refer to these instructions for the next few weeks. These discharge instructions provide you with  general information on caring for yourself after surgery. Your caregiver may also give you specific instructions. While your treatment has been planned according to the most current medical practices available, unavoidable complications occasionally occur. If you have any problems or questions after discharge, please call your caregiver. HOME CARE INSTRUCTIONS   It is normal to be sore for a couple weeks following surgery. See your caregiver if this seems to be getting worse rather than better.   Take prescribed medicine as directed. Only take over-the-counter or prescription medicines for pain, discomfort, and or fever, as directed by your caregiver.   Use showers for bathing, until seen or as instructed.   Change dressings if necessary or as directed.   You may resume normal diet and activities as directed or allowed.   Liquids and soft foods may be easier to digest at first. Once you can tolerate liquid and soft foods, you can begin eating regular solid foods.   Avoid lifting or driving until you are instructed otherwise.   Make an appointment to see your caregiver for suture (stitches) or staple removal when instructed.   You may use ice on your incision for 15 to 20 minutes, 3 to 4 times per day for the first two days.  SEEK MEDICAL CARE IF:   You see redness, swelling, or have increasing pain in wounds.   You have pus coming from your wound.   You develop an unexplained temperature over 102 F (38.9 C).   You have a bad smell coming from the wound or dressing.   You develop lightheadedness or feel faint.  SEEK IMMEDIATE MEDICAL CARE IF:   You have increased bleeding from wounds.   You have increasing abdominal pain, repeated vomiting, dehydration or fainting.   You develop severe weakness, chest pain or back pain   You develop blood in your vomit, stool or you have tarry stool.   You develop a rash.   You have difficulty breathing.   You develop any reaction or  side effects to medicines given.  Document Released: 03/05/2005 Document Revised: 08/05/2011 Document Reviewed: 09/19/2007 [facility] Patient Information 2012 [place], [place].

## 2012-02-23 NOTE — Discharge Summary (Signed)
Physician Discharge Summary  Patient ID: RUDOLPH DOBLER MRN: [id number] DOB/AGE: 1951/03/04 61 y.o.  Admit date: 02/13/2012 Discharge date: 02/23/2012  Admission Diagnoses: Abdominal pain,NV   Discharge Diagnoses: EXPLORATORY LAPAROTOMY SMALL BOWEL RESECTION  HERNIA REPAIR VENTRAL ADULT    Discharged Condition: stable  Hospital Course:60 yof with history of what ends up being a tah/bso, cholecystectomy and then sigmoid colectomy for diverticulitis. She also underwent colostomy closure. Her ventral hernia has bothered her from time to time but usually reduces although this has been with some more difficulty lately. It began hurting much more and this was associated with large volume of nausea and emesis overnight. She was not eating. She complained of pain at the site of the hernia and was unable to reduce the hernia as she usually does. Evaluation in the ED with CT confirmed that she has a sbo associated with this hernia. Fore this reason she was taken to the operating room for a exploratory laparotomy and possible small bowel resection. Postoperatively she has progressed well, she had required a wound vac, but is now able to be treated with daily wound care (aquacel) as per wound care recommendations. She has remained afebrile and hemodynamically stable. She is stable for discharge to home with follow-up home care and with Dr. Dwain Sarna in 1 weeks time.  Consults: Wound care  Significant Diagnostic Studies: labs, microbiology and radiology. Treatments: IV hydration, antibiotics, analgesia, respiratory therapy and surgery, wound care.  Discharge Exam: Blood pressure 143/70, pulse 73, temperature 97.9 F (36.6 C), temperature source Oral, resp. rate 18, height 5\' 2"  (1.575 m), weight 196 lb 12.8 oz (89.268 kg), SpO2 97.00%. General appearance: alert, cooperative, appears stated age, no distress and mildly obese Abdomen is soft, non tender, wound vac was removed wound measured 13 cm x 5 cm  x 1 cm. 250cc of dark serous drng in wound vac for past 24hrs. +BS, +BM, + flatus, no NV.  Disposition: Home to self care, and with home health for wound care.   Medication List  As of 02/23/2012 10:58 AM   ASK your doctor about these medications         fexofenadine-pseudoephedrine 180-240 MG per 24 hr tablet   Commonly known as: ALLEGRA-D 24   Take 1 tablet by mouth daily as needed. Seasonal allergies      ibuprofen 200 MG tablet   Commonly known as: ADVIL,MOTRIN   Take 200 mg by mouth every 6 (six) hours as needed. For pain             Signed: Blenda Mounts 02/23/2012, 10:58 AM

## 2012-02-23 NOTE — Consult Note (Signed)
WOC consult Note Reason for Consult: Wound consult requested to assess abd wound with PA at bedside. Vac dressing removed. Wound type: Full thickness post-op wound.  Measurement:13X5X1cm, 100% red and granulating.  No odor or discomfort with dressing removal.  250cc dark red-brown liquid in cannister. Dressing procedure/placement/frequency: Pa d/ced order for vac.  Abd packed with Aquacel to provide antimicrobial benifits and absorb drainage.  PT COULD BENEFIT FROM HOME HEALTH DRESSING AFTER DISCHARGE. Planning to D/c today; plan of care to be arranged by [facility]. Will not plan to follow further unless re-consulted.  [address] RN, MSN, [employer]  [phone]

## 2012-02-23 NOTE — Discharge Summary (Signed)
Agree with above.  Wilmon Arms. Corliss Skains, MD, [facility]  02/23/2012 12:30 PM

## 2012-02-23 NOTE — Progress Notes (Signed)
Patient ID: Barbara Fletcher, female   DOB: Apr 14, 1951, 61 y.o.   MRN: [id number] 10 Days Post-Op  Subjective: Patient reports no problems with regular diet today. No nausea/vomiting/abdominal pain.  +BM.  Ready to go home!  Objective: Vital signs in last 24 hours: Temp:  [97.9 F (36.6 C)-98.2 F (36.8 C)] 97.9 F (36.6 C) (06/26 0504) Pulse Rate:  [73-76] 73  (06/26 0504) Resp:  [18-20] 18  (06/26 0504) BP: (143-148)/(61-70) 143/70 mmHg (06/26 0504) SpO2:  [96 %-99 %] 97 % (06/26 0504) Last BM Date: 02/22/12  Intake/Output from previous day: 06/25 0701 - 06/26 0700 In: 2076.9 [P.O.:360; I.V.:1716.9] Out: 2900 [Urine:2900] Intake/Output this shift: Total I/O In: 240 [P.O.:240] Out: 0  General appearance: alert, cooperative and no distress Head: Normocephalic, without obvious abnormality, atraumatic Lungs: clear to auscultation bilaterally Heart: regular rate and rhythm, S1, S2 normal, no murmur, click, rub or gallop Abdomen: +BS, soft, minimal tenderness to palpation Extremities: extremities normal, atraumatic, no cyanosis or edema Incision/Wound:vac in place non distended. Wound measures 13cm x 5 cm x 1cm. Total drainage was over past 24hrs, dark, thin, serous fluid.  No New Lab Studies.   Assessment/Plan: s/p Procedure(s) (LRB): EXPLORATORY LAPAROTOMY (N/A) SMALL BOWEL RESECTION () HERNIA REPAIR VENTRAL ADULT () 1. Continue with daily wound care; utilize Aquacel as per Wound care recommendations. 2. Discharge to home, follow-up wound care with home health.  3. Follow-up with our office in 1 weeks time with Dr.Wakefield.    LOS: 10 days    Benard Minturn 02/23/2012

## 2012-02-23 NOTE — Progress Notes (Signed)
DC home with husband. Verbally understood DC instructions. Dsg supplies sent home with pt.. Pt and husband states they understands how and when to do dressing changes. No verbal questions at DC.

## 2012-03-07 ENCOUNTER — Encounter (INDEPENDENT_AMBULATORY_CARE_PROVIDER_SITE_OTHER): Payer: Self-pay | Admitting: General Surgery

## 2012-03-07 ENCOUNTER — Ambulatory Visit (INDEPENDENT_AMBULATORY_CARE_PROVIDER_SITE_OTHER): Payer: PRIVATE HEALTH INSURANCE | Admitting: General Surgery

## 2012-03-07 VITALS — BP 128/64 | HR 66 | Temp 97.6°F | Ht 62.0 in | Wt 176.6 lb

## 2012-03-07 DIAGNOSIS — Z09 Encounter for follow-up examination after completed treatment for conditions other than malignant neoplasm: Secondary | ICD-10-CM

## 2012-03-07 NOTE — Progress Notes (Signed)
Subjective:     Patient ID: Barbara Fletcher, female   DOB: December 28, 1950, 61 y.o.   MRN: [id number]  HPI 61 yof with multiple prior abdominal operations who I met on call with bowel obstruction and incarcerated ventral hernia.  She underwent laparotomy with loa, sbr and primary repair of hernia.  She had prolonged ileus that eventually resolved and I left her wound open to heal by secondary intention.  She is now eating well, having bms and her wound is slowly healing.  Review of Systems     Objective:   Physical Exam Abdomen soft, nontender, wound granulating and open with no infection, considerably smaller since last visit    Assessment:     S/p incarcerated hernia repair    Plan:     She is progressing as expected.  Can do once daily wet to dry dressings now.  Will stay at reduced activity and I will see in 3-4 weeks

## 2012-04-10 ENCOUNTER — Ambulatory Visit (INDEPENDENT_AMBULATORY_CARE_PROVIDER_SITE_OTHER): Payer: PRIVATE HEALTH INSURANCE | Admitting: General Surgery

## 2012-04-10 ENCOUNTER — Encounter (INDEPENDENT_AMBULATORY_CARE_PROVIDER_SITE_OTHER): Payer: Self-pay | Admitting: General Surgery

## 2012-04-10 VITALS — BP 118/72 | HR 80 | Temp 97.0°F | Resp 16 | Ht 62.0 in | Wt 169.4 lb

## 2012-04-10 DIAGNOSIS — Z09 Encounter for follow-up examination after completed treatment for conditions other than malignant neoplasm: Secondary | ICD-10-CM

## 2012-04-10 NOTE — Progress Notes (Signed)
Subjective:     Patient ID: Barbara Fletcher, female   DOB: [birth year], 61 y.o.   MRN: [id number]  HPI This is a 61 year old female I met with an incarcerated incisional hernia and a bowel torsion. I took to the operating room and ended up doing a small bowel resection with a primary repair of her hernia. She comes in today doing well. Her incision is now healed. She is having bowel movements. She has no nausea or vomiting he is tolerating a regular diet.  Review of Systems     Objective:   Physical Exam Abdomen is soft and nontender. Incision is healed without any infection. There is no evidence of an abdominal wall hernia    Assessment:     Status post small bowel resection and primary repair of incisional hernia for obstruction    Plan:      I told her she can begin returning to normal activity. She is going to come back and see me as needed now. She is at risk of having another hernia.

## 2012-07-10 ENCOUNTER — Ambulatory Visit: Payer: Self-pay | Admitting: Family Medicine

## 2013-07-05 IMAGING — CT CT ABD-PELV W/ CM
2 of 5 series · 17 of 46 positions shown, 19 images · IV contrast (APPLIED)
Comparison: 05/05/2005.

CLINICAL DATA: Abdominal pain.  Known ventral hernia.

CT ABDOMEN AND PELVIS WITH CONTRAST
TECHNIQUE: Multidetector CT imaging of the abdomen and pelvis was
performed following the standard protocol during bolus
administration of intravenous contrast.
Contrast: 100mL OMNIPAQUE IOHEXOL 300 MG/ML  SOLN

[Series 2: abd/pelv with 5.0 b31f st · axial · 0.83mm/px · z∈[-430,-50]mm · 14 of 88 slices shown, 16 images]
[im 6/88  soft-tissue]
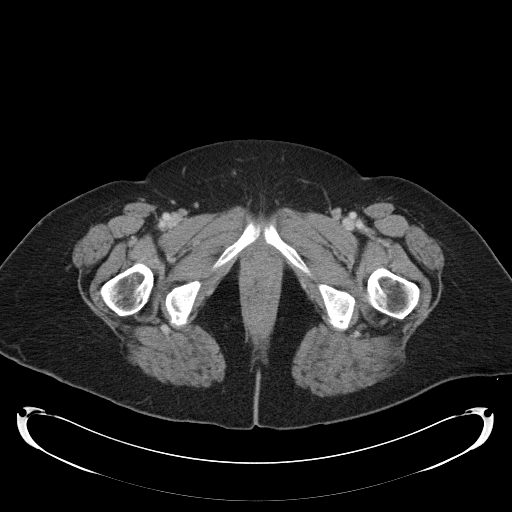
[im 6/88  bone]
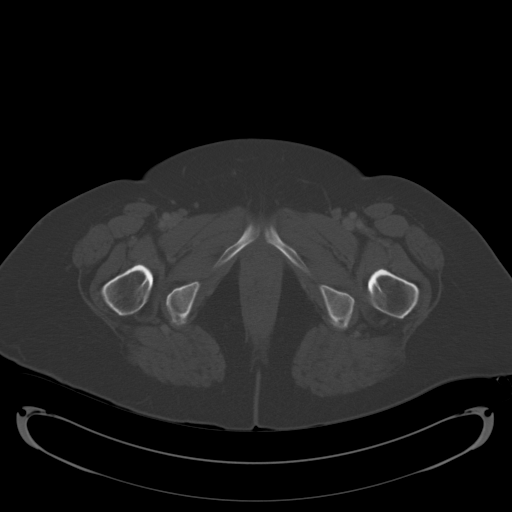
[im 11/88  soft-tissue]
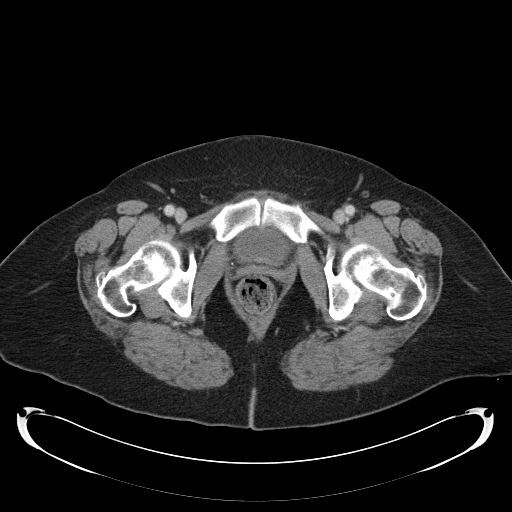
[im 16/88  soft-tissue]
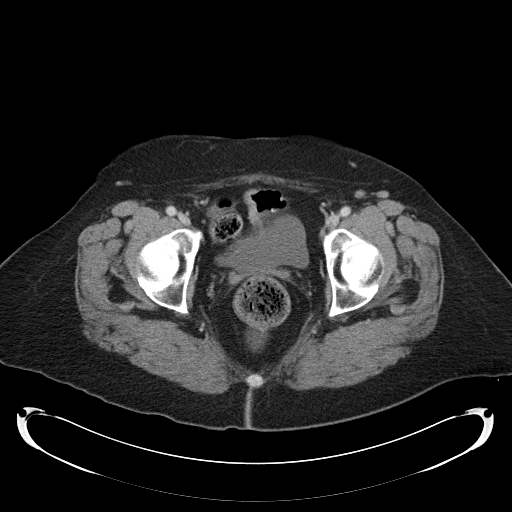
[im 26/88  soft-tissue]
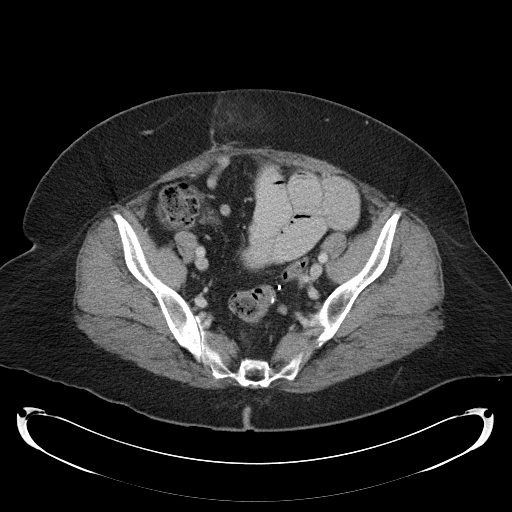
[im 31/88  soft-tissue]
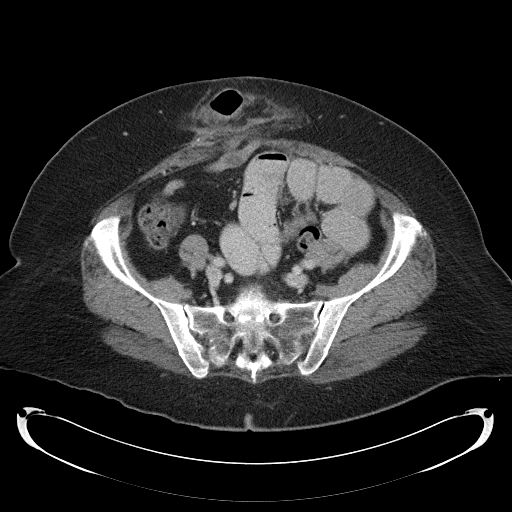
[im 36/88  soft-tissue]
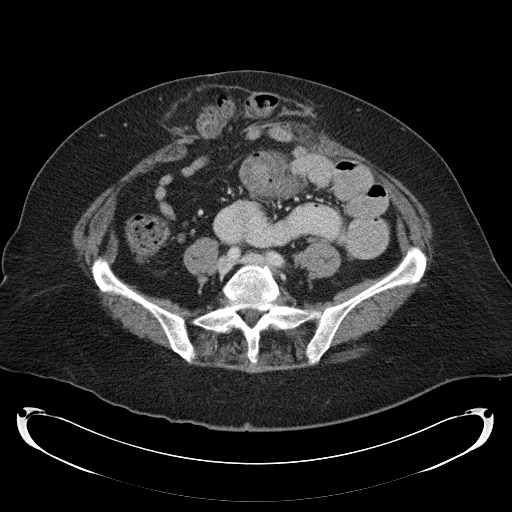
[im 41/88  soft-tissue]
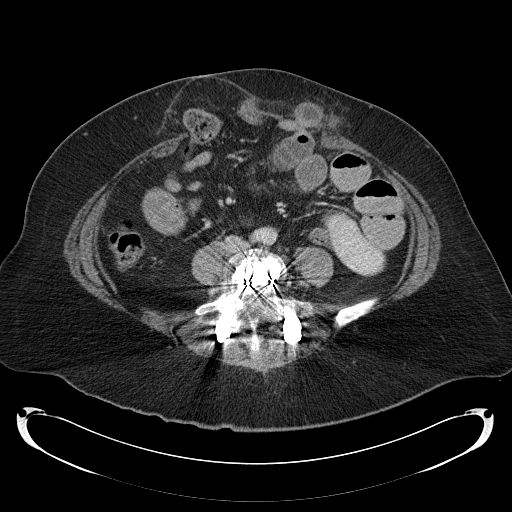
[im 47/88  soft-tissue]
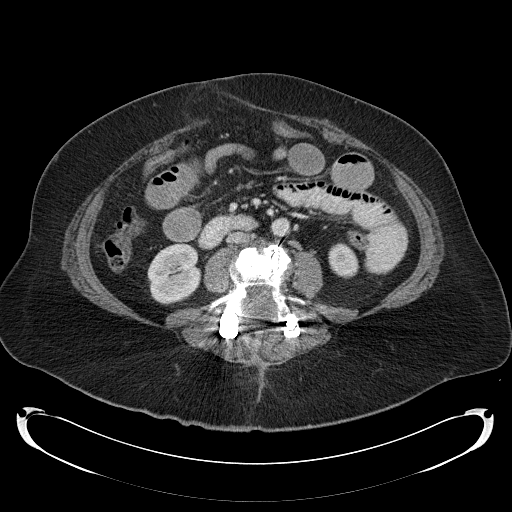
[im 52/88  soft-tissue]
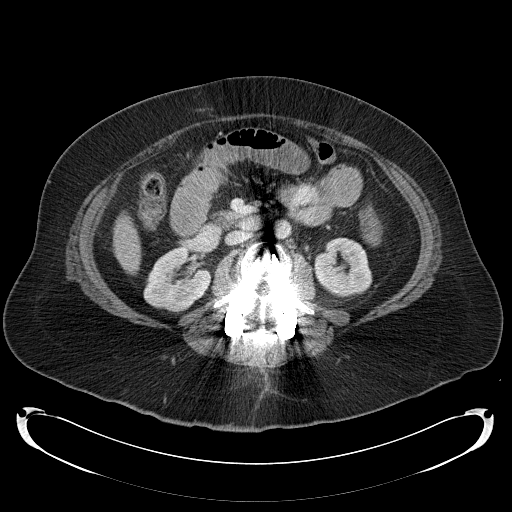
[im 52/88  bone]
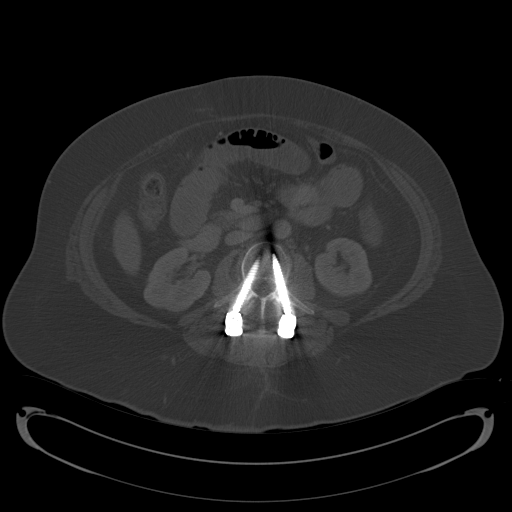
[im 57/88  soft-tissue]
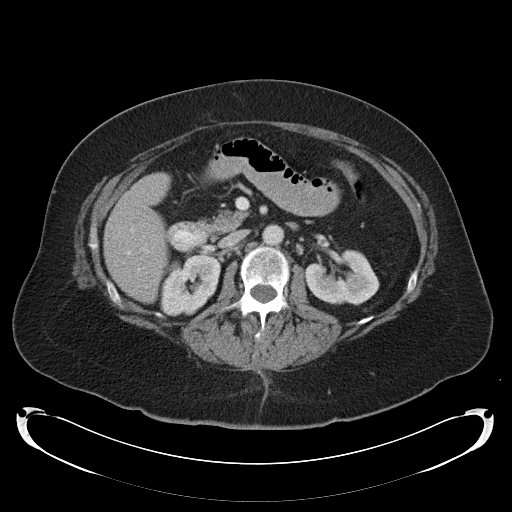
[im 67/88  soft-tissue]
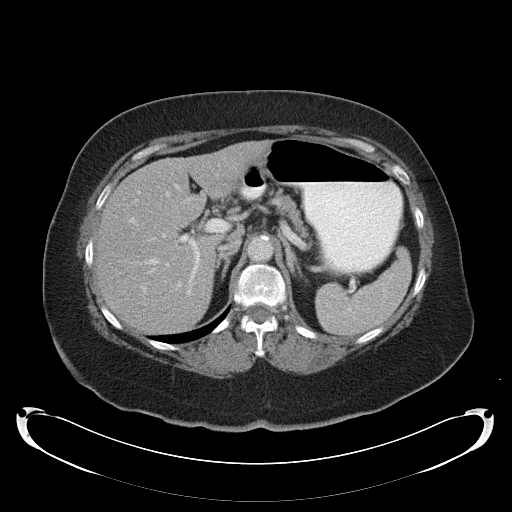
[im 72/88  soft-tissue]
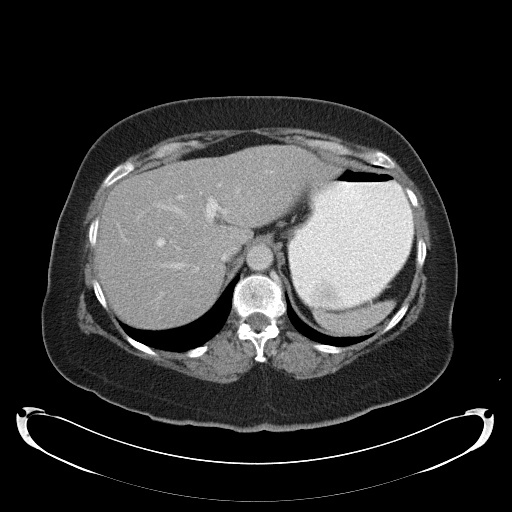
[im 77/88  soft-tissue]
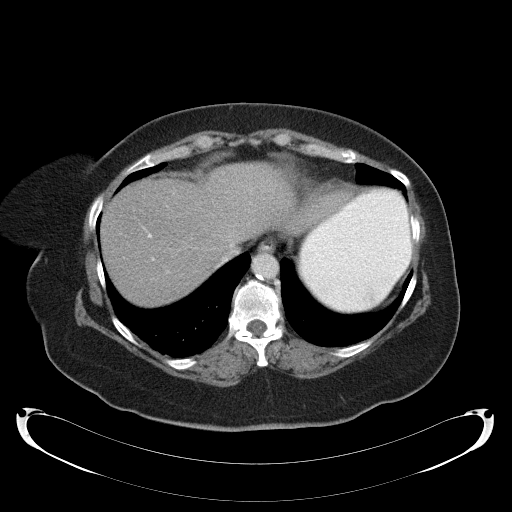
[im 82/88  soft-tissue]
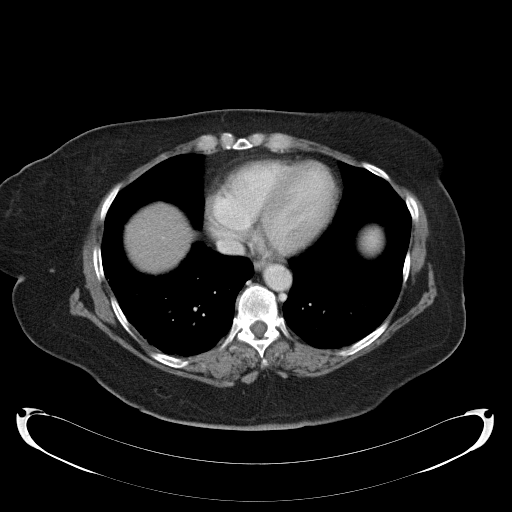

[Series 5: coronals · coronal · 0.85mm/px · 3 of 119 slices shown]
[im 40/119  soft-tissue]
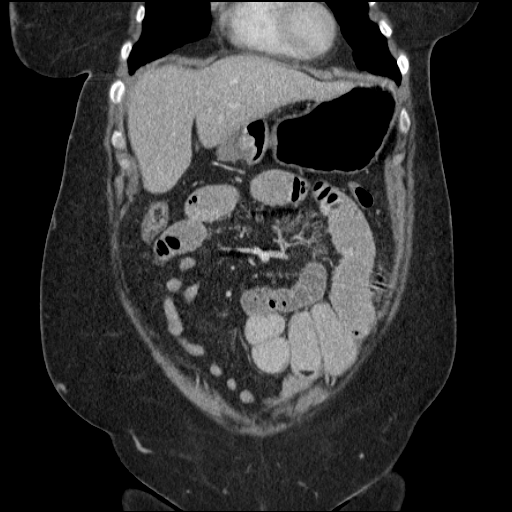
[im 53/119  soft-tissue]
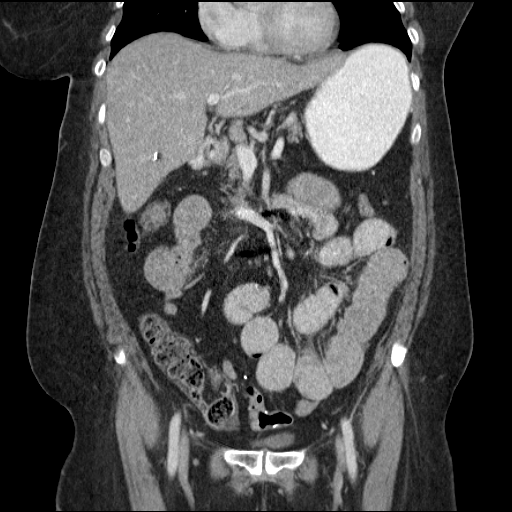
[im 66/119  soft-tissue]
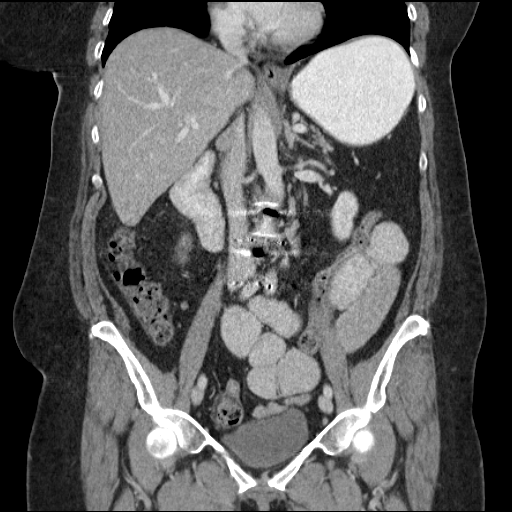

[17 of 46 positions shown; findings below may reference images not displayed]

FINDINGS: Dependent atelectasis at the lung bases.  Liver is within
normal limits.  Cholecystectomy.  The spleen normal.  Adrenal
glands normal.  Pancreas and common bile duct appear normal.
Stomach is within normal limits.  Small bowel obstruction is
present.  There is a small left paramedian ventral hernia that is
the source of obstruction.  There is a knuckle of small bowel with
decompression of the distal bowel.  This probably involves jejunum.

There is a larger right paramedian ventral hernia containing a loop
of transverse colon without evidence of obstruction.

Surgical anastomosis is present in the sigmoid colon near the
rectum.  Hysterectomy.  Urinary bladder normal.

Normal renal enhancement and delayed excretion of contrast. Small
right upper pole simple renal cysts, measuring under 1 cm.
Postoperative changes of the L3-L5 posterior lumbar interbody
fusion.
IMPRESSION: 1.  Complete small bowel obstruction secondary to the herniated
loop of bowel into the left paramedian ventral hernia (image 48
series 2).  No perforation or pneumatosis.
2.  Large right paramedian ventral hernia containing nonobstructed
transverse colon.
3.  Cholecystectomy, hysterectomy and lumbar spine surgery.
Partial sigmoidectomy.

## 2014-03-27 ENCOUNTER — Encounter: Payer: Self-pay | Admitting: Podiatry

## 2014-03-27 ENCOUNTER — Ambulatory Visit (INDEPENDENT_AMBULATORY_CARE_PROVIDER_SITE_OTHER): Payer: 59

## 2014-03-27 ENCOUNTER — Ambulatory Visit (INDEPENDENT_AMBULATORY_CARE_PROVIDER_SITE_OTHER): Payer: 59 | Admitting: Podiatry

## 2014-03-27 VITALS — BP 157/83 | HR 61 | Resp 16 | Ht 62.0 in | Wt 175.0 lb

## 2014-03-27 DIAGNOSIS — G629 Polyneuropathy, unspecified: Secondary | ICD-10-CM

## 2014-03-27 DIAGNOSIS — G589 Mononeuropathy, unspecified: Secondary | ICD-10-CM

## 2014-03-27 MED ORDER — PREGABALIN 50 MG PO CAPS: 50.0000 mg | ORAL_CAPSULE | Freq: Two times a day (BID) | ORAL | Status: DC

## 2014-03-27 NOTE — Progress Notes (Signed)
   Subjective:    Patient ID: Barbara Fletcher, female    DOB: 18-May-1951, 63 y.o.   MRN: [id number]  HPI Comments: i have numbness in both of my feet and the left is worse. Its been going on for several years. The nerve pain is getting worse. Closed in shoes bother my feet, i get off balance a lot when walking, i have to change position a lot when i stand. i dont do anything for my feet.  Foot Pain Associated symptoms include numbness.      Review of Systems  Musculoskeletal:       Back pain Difficulty walking  Neurological: Positive for numbness.  All other systems reviewed and are negative.      Objective:   Physical Exam: I have reviewed his past medical history medications allergies surgeries social history and review of systems. Pulses are strongly palpable bilateral. Neurologic sensorium is diminished per Semmes-Weinstein monofilament to the toes and forefoot bilaterally. He tendon reflexes are intact bilateral. Muscle strength is 5 over 5 dorsiflexors plantar flexors inverters everters all intrinsic musculature is intact right foot for out of 5 left foot. Orthopedic evaluation demonstrates all joints distal to the ankle a full range of motion without crepitus mild HAV from hammertoe deformities noted bilateral but asymptomatic. Radiographic evaluation does not demonstrate any osseous abnormalities. No fractures are noted. Cutaneous evaluation demonstrates supple well hydrated cutis no erythema edema cellulitis drainage or odor.        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 Left appears to be worse than the right.  Plan: Because of the burning that she has on a consistent basis we are going to start her on Lyrica 50 mg 1 by mouth twice a day I will reevaluate her next month.

## 2014-04-24 ENCOUNTER — Ambulatory Visit: Payer: 59 | Admitting: Podiatry

## 2014-05-15 ENCOUNTER — Ambulatory Visit: Payer: 59 | Admitting: Podiatry

## 2014-05-16 ENCOUNTER — Ambulatory Visit (INDEPENDENT_AMBULATORY_CARE_PROVIDER_SITE_OTHER): Payer: 59 | Admitting: Podiatry

## 2014-05-16 ENCOUNTER — Encounter: Payer: Self-pay | Admitting: Podiatry

## 2014-05-16 DIAGNOSIS — Z79899 Other long term (current) drug therapy: Secondary | ICD-10-CM

## 2014-05-16 DIAGNOSIS — G629 Polyneuropathy, unspecified: Secondary | ICD-10-CM

## 2014-05-16 DIAGNOSIS — G589 Mononeuropathy, unspecified: Secondary | ICD-10-CM

## 2014-05-16 NOTE — Progress Notes (Signed)
She presents today for followup of her Lyrica. She states that it seems that Lyrica seems to be working for her. She is supposed to be taking a twice daily however she's only been taking it at nighttime.  Objective: Vital signs are stable she is alert and oriented x3 does not states she has had any side effects at this point her pulses are palpable she has no change in her physical examination there is no edema bilateral.  Assessment: Neuropathy.  Plan: Continue the use of the Lyrica 50 mg at least nightly I did encourage her to take 1 at lunch time as well I will followup with her as needed.

## 2014-09-27 ENCOUNTER — Other Ambulatory Visit: Payer: Self-pay | Admitting: Podiatry

## 2015-01-07 ENCOUNTER — Ambulatory Visit: Payer: 59 | Admitting: Podiatry

## 2015-01-13 ENCOUNTER — Ambulatory Visit (INDEPENDENT_AMBULATORY_CARE_PROVIDER_SITE_OTHER): Payer: 59

## 2015-01-13 ENCOUNTER — Encounter: Payer: Self-pay | Admitting: Podiatry

## 2015-01-13 ENCOUNTER — Ambulatory Visit (INDEPENDENT_AMBULATORY_CARE_PROVIDER_SITE_OTHER): Payer: 59 | Admitting: Podiatry

## 2015-01-13 VITALS — BP 161/92 | HR 60 | Resp 16

## 2015-01-13 DIAGNOSIS — M722 Plantar fascial fibromatosis: Secondary | ICD-10-CM | POA: Diagnosis not present

## 2015-01-13 MED ORDER — METHYLPREDNISOLONE 4 MG PO TBPK: ORAL_TABLET | ORAL | Status: DC

## 2015-01-13 MED ORDER — MELOXICAM 15 MG PO TABS: 15.0000 mg | ORAL_TABLET | Freq: Every day | ORAL | Status: DC

## 2015-01-13 NOTE — Progress Notes (Signed)
She presents today with a chief complaint of a painful left heel times several weeks. States that up had plantar fasciitis before and it feels like that. She states that the Lyrica seems to be controlling her neuropathy pretty well.  Objective: Vital signs are stable alert and oriented 3. Pulses are strongly palpable. She is pain on palpation medial calcaneal tubercle of the left heel. Radiographs confirm plantar distally oriented calcaneal heel spur was soft tissue increase in density at the plantar fascial calcaneal insertion site.  Assessment: Plantar fasciitis left.  Plan: Injected her left heel today with Kenalog and local anesthetic. Discussed appropriate shoe gear stretching exercises ice therapy and shoe modifications. Start her on a Medrol Dosepak to be followed by meloxicam. And I will follow-up with her in 1 month.

## 2015-02-10 ENCOUNTER — Encounter: Payer: Self-pay | Admitting: Podiatry

## 2015-02-10 ENCOUNTER — Ambulatory Visit (INDEPENDENT_AMBULATORY_CARE_PROVIDER_SITE_OTHER): Payer: 59 | Admitting: Podiatry

## 2015-02-10 VITALS — BP 140/73 | HR 62 | Resp 16

## 2015-02-10 DIAGNOSIS — M722 Plantar fascial fibromatosis: Secondary | ICD-10-CM | POA: Diagnosis not present

## 2015-02-10 NOTE — Progress Notes (Signed)
She presents today for follow-up of her plantar fasciitis left heel. She states that is doing much better.  Objective: Vital signs are stable she is alert and oriented 3. Pulses are palpable left foot.  Assessment: Plantar fasciitis left resolving.  Plan: Injected her left heel again today and she will continue all conservative therapies.

## 2015-03-11 ENCOUNTER — Other Ambulatory Visit: Payer: Self-pay

## 2015-03-11 DIAGNOSIS — I10 Essential (primary) hypertension: Secondary | ICD-10-CM

## 2015-03-11 DIAGNOSIS — E119 Type 2 diabetes mellitus without complications: Secondary | ICD-10-CM

## 2015-03-11 DIAGNOSIS — E785 Hyperlipidemia, unspecified: Secondary | ICD-10-CM

## 2015-03-17 ENCOUNTER — Ambulatory Visit: Payer: 59 | Admitting: Podiatry

## 2015-03-18 ENCOUNTER — Other Ambulatory Visit: Payer: Self-pay | Admitting: Family Medicine

## 2015-03-19 LAB — COMPREHENSIVE METABOLIC PANEL
ALT: 14 IU/L (ref 0–32)
AST: 16 IU/L (ref 0–40)
Albumin/Globulin Ratio: 1.9 (ref 1.1–2.5)
Albumin: 3.9 g/dL (ref 3.6–4.8)
Alkaline Phosphatase: 76 IU/L (ref 39–117)
BUN/Creatinine Ratio: 22 (ref 11–26)
BUN: 24 mg/dL (ref 8–27)
Bilirubin Total: 0.3 mg/dL (ref 0.0–1.2)
CO2: 25 mmol/L (ref 18–29)
Calcium: 9.7 mg/dL (ref 8.7–10.3)
Chloride: 102 mmol/L (ref 97–108)
Creatinine, Ser: 1.08 mg/dL — ABNORMAL HIGH (ref 0.57–1.00)
GFR calc Af Amer: 63 mL/min/{1.73_m2} (ref >59)
GFR calc non Af Amer: 55 mL/min/{1.73_m2} — ABNORMAL LOW (ref >59)
Globulin, Total: 2.1 g/dL (ref 1.5–4.5)
Glucose: 106 mg/dL — ABNORMAL HIGH (ref 65–99)
Potassium: 4.9 mmol/L (ref 3.5–5.2)
Sodium: 140 mmol/L (ref 134–144)
Total Protein: 6 g/dL (ref 6.0–8.5)

## 2015-03-19 LAB — TSH: TSH: 1.6 u[IU]/mL (ref 0.450–4.500)

## 2015-03-19 LAB — HEMOGLOBIN A1C
Est. average glucose Bld gHb Est-mCnc: 131 mg/dL
Hgb A1c MFr Bld: 6.2 % — ABNORMAL HIGH (ref 4.8–5.6)

## 2015-03-19 LAB — LIPID PANEL WITH LDL/HDL RATIO
Cholesterol, Total: 204 mg/dL — ABNORMAL HIGH (ref 100–199)
HDL: 67 mg/dL (ref >39)
LDL Calculated: 114 mg/dL — ABNORMAL HIGH (ref 0–99)
LDl/HDL Ratio: 1.7 ratio units (ref 0.0–3.2)
Triglycerides: 114 mg/dL (ref 0–149)
VLDL Cholesterol Cal: 23 mg/dL (ref 5–40)

## 2015-03-20 ENCOUNTER — Telehealth: Payer: Self-pay

## 2015-03-20 NOTE — Telephone Encounter (Signed)
-----   Message from Jerrol Banana., MD sent at 03/20/2015  7:23 AM EDT ----- Labs stable. Patient is prediabetic. Diet and exercise daily. Can consider starting metformin which might help. Have her discuss the metformin with her husband and let us know.

## 2015-03-20 NOTE — Telephone Encounter (Signed)
Results given to husband and he will discuss with wife.  Barbara Fletcher

## 2015-03-21 LAB — CBC WITH DIFFERENTIAL/PLATELET

## 2015-03-24 ENCOUNTER — Ambulatory Visit (INDEPENDENT_AMBULATORY_CARE_PROVIDER_SITE_OTHER): Payer: 59 | Admitting: Podiatry

## 2015-03-24 VITALS — BP 137/79 | HR 64 | Resp 16

## 2015-03-24 DIAGNOSIS — M722 Plantar fascial fibromatosis: Secondary | ICD-10-CM | POA: Diagnosis not present

## 2015-03-24 NOTE — Progress Notes (Signed)
She presents today for follow-up of plantar fasciitis her left heel. He states that she is a proximally 60% improved. She continues all conservative therapies.  Objective: Vital signs are stable she is alert and oriented 3. Pulses are palpable. She has pain on palpation medial calcaneal tubercle of the left heel.  Assessment: Plantar fascitis left.  Plan: We injected her left heel again today with Kenalog and local aesthetic encouraged range of motion activities the use of her night splint and a splint as well as the use of tennis shoes. Discussed possible need for orthotics should discontinued.

## 2015-04-19 ENCOUNTER — Other Ambulatory Visit: Payer: Self-pay | Admitting: Family Medicine

## 2015-04-19 ENCOUNTER — Encounter: Payer: Self-pay | Admitting: Family Medicine

## 2015-04-19 DIAGNOSIS — N390 Urinary tract infection, site not specified: Secondary | ICD-10-CM | POA: Insufficient documentation

## 2015-04-19 DIAGNOSIS — N3000 Acute cystitis without hematuria: Secondary | ICD-10-CM

## 2015-04-19 MED ORDER — CIPROFLOXACIN HCL 500 MG PO TABS: 500.0000 mg | ORAL_TABLET | Freq: Two times a day (BID) | ORAL | Status: DC

## 2015-04-23 ENCOUNTER — Ambulatory Visit: Payer: 59 | Admitting: Podiatry

## 2015-04-30 ENCOUNTER — Ambulatory Visit (INDEPENDENT_AMBULATORY_CARE_PROVIDER_SITE_OTHER): Payer: 59 | Admitting: Podiatry

## 2015-04-30 ENCOUNTER — Encounter: Payer: Self-pay | Admitting: Podiatry

## 2015-04-30 VITALS — BP 126/68 | HR 70 | Resp 16

## 2015-04-30 DIAGNOSIS — G629 Polyneuropathy, unspecified: Secondary | ICD-10-CM | POA: Diagnosis not present

## 2015-04-30 DIAGNOSIS — M722 Plantar fascial fibromatosis: Secondary | ICD-10-CM | POA: Diagnosis not present

## 2015-04-30 MED ORDER — PREGABALIN 50 MG PO CAPS: 50.0000 mg | ORAL_CAPSULE | Freq: Two times a day (BID) | ORAL | Status: DC

## 2015-04-30 NOTE — Progress Notes (Signed)
She presents today for follow-up of plantar fasciitis to her left heel. She states that is doing much better. She states that she continues both of her braces and a night splint as well as her medications.  Objective: Vital signs are stable alert and oriented 64 year old female no acute distress pulses are strongly palpable. Neurologic sensorium is intact. Deep tendon reflexes are intact. No pain on palpation medial calcaneal tubercle of the right heel.  Assessment: Resolving plantar fasciitis.  Plan: I would continue all conservative therapies including the use of the splints as well as the nonstorable anti-inflammatory drugs at least one more month. Follow-up with me as needed.

## 2015-10-27 ENCOUNTER — Ambulatory Visit (INDEPENDENT_AMBULATORY_CARE_PROVIDER_SITE_OTHER): Payer: 59 | Admitting: Family Medicine

## 2015-10-27 VITALS — BP 128/66 | HR 74 | Temp 97.8°F | Resp 16 | Wt 201.0 lb

## 2015-10-27 DIAGNOSIS — S8391XA Sprain of unspecified site of right knee, initial encounter: Secondary | ICD-10-CM | POA: Diagnosis not present

## 2015-10-27 NOTE — Progress Notes (Signed)
Patient ID: Barbara Fletcher, female   DOB: [birth year], 65 y.o.   MRN: [id number]    Subjective:  HPI  Patient fell about 6 weeks ago now. She fell on her right knee on a 'tennis court type of a surface." She did not have this treated after. She has had some tenderness to the touch in teh right knee area, swelling and bruising-which is getting better. She is not having hard time walking with this much. Mechanism of injury as patient fell straight forward and landed directly on her right knee. No twisting mechanism at all per patient. It did not lock or give.  Prior to Admission medications   Medication Sig Start Date End Date Taking? Authorizing Provider  diazepam (VALIUM) 2 MG tablet Take 2 mg by mouth as needed for anxiety.   Yes Historical Provider, MD  fexofenadine-pseudoephedrine (ALLEGRA-D 24) 180-240 MG per 24 hr tablet Take 1 tablet by mouth daily as needed. Seasonal allergies    Yes Historical Provider, MD  ibuprofen (ADVIL,MOTRIN) 200 MG tablet Take 200 mg by mouth every 6 (six) hours as needed. For pain   Yes Historical Provider, MD  meloxicam (MOBIC) 15 MG tablet Take 1 tablet (15 mg total) by mouth daily. 01/13/15  Yes Max T Hyatt, DPM  potassium chloride (K-DUR,KLOR-CON) 10 MEQ tablet  09/10/15  Yes Historical Provider, MD  pregabalin (LYRICA) 50 MG capsule Take 1 capsule (50 mg total) by mouth 2 (two) times daily. 04/30/15  Yes [place], DPM    Patient Active Problem List   Diagnosis Date Noted  . Urinary tract infection 04/19/2015    Past Medical History  Diagnosis Date  . Arthritis   . Diverticulitis   . Hypertension     Social History   Social History  . Marital Status: Married    Spouse Name: N/A  . Number of Children: N/A  . Years of Education: N/A   Occupational History  . Not on file.   Social History Main Topics  . Smoking status: Never Smoker   . Smokeless tobacco: Not on file  . Alcohol Use: 0.6 oz/week    1 Glasses of wine per week     Comment:  occ  . Drug Use: No  . Sexual Activity: Not on file   Other Topics Concern  . Not on file   Social History Narrative    Allergies  Allergen Reactions  . Levsin [Hyoscyamine Sulfate] Hives and Shortness Of Breath    Review of Systems  Constitutional: Negative.   HENT: Negative.   Eyes: Negative.   Respiratory: Negative.   Cardiovascular: Negative.   Gastrointestinal: Negative.   Musculoskeletal: Positive for joint pain.  Skin: Negative.   Endo/Heme/Allergies: Negative.   Psychiatric/Behavioral: Negative.     Immunization History  Administered Date(s) Administered  . Influenza Split 09/11/2011   Objective:  BP 128/66 mmHg  Pulse 74  Temp(Src) 97.8 F (36.6 C)  Resp 16  Wt 201 lb (91.173 kg)  Physical Exam  Constitutional: She is oriented to person, place, and time and well-developed, well-nourished, and in no distress.  HENT:  Head: Normocephalic and atraumatic.  Right Ear: External ear normal.  Left Ear: External ear normal.  Nose: Nose normal.  Eyes: Conjunctivae are normal.  Neck: Neck supple. No thyromegaly present.  Cardiovascular: Normal rate, regular rhythm and normal heart sounds.   Pulmonary/Chest: Effort normal and breath sounds normal.  Abdominal: Soft.  Musculoskeletal: She exhibits edema and tenderness.  3 patellar bursa on  the left is full but nontender. No patellar tenderness. He has swelling of the  Medial right knee with no obvious instability. She is significantly tender along the medial joint line  Lymphadenopathy:    She has no cervical adenopathy.  Neurological: She is alert and oriented to person, place, and time.  Skin: Skin is warm and dry.  Psychiatric: Mood, memory, affect and judgment normal.    Lab Results  Component Value Date   WBC CANCELED 03/18/2015   HGB 10.9* 02/17/2012   HCT CANCELED 03/18/2015   PLT CANCELED 03/18/2015   GLUCOSE 106* 03/18/2015   CHOL 204* 03/18/2015   TRIG 114 03/18/2015   HDL 67 03/18/2015    LDLCALC 114* 03/18/2015   TSH 1.600 03/18/2015   HGBA1C 6.2* 03/18/2015    CMP     Component Value Date/Time   NA 140 03/18/2015 0907   NA 142 02/23/2012 0500   K 4.9 03/18/2015 0907   CL 102 03/18/2015 0907   CO2 25 03/18/2015 0907   GLUCOSE 106* 03/18/2015 0907   GLUCOSE 107* 02/23/2012 0500   BUN 24 03/18/2015 0907   BUN 6 02/23/2012 0500   CREATININE 1.08* 03/18/2015 0907   CALCIUM 9.7 03/18/2015 0907   PROT 6.0 03/18/2015 0907   PROT 6.0 02/23/2012 0500   ALBUMIN 3.9 03/18/2015 0907   ALBUMIN 2.7* 02/23/2012 0500   AST 16 03/18/2015 0907   ALT 14 03/18/2015 0907   ALKPHOS 76 03/18/2015 0907   BILITOT 0.3 03/18/2015 0907   BILITOT 0.2* 02/23/2012 0500   GFRNONAA 55* 03/18/2015 0907   GFRAA 63 03/18/2015 0907    Assessment and Plan :  1. Knee sprain, right, initial encounter MCL sprain versus medial meniscus is aggravation of chronic arthritis.use Advil 2 pills with each meal until she is seen by orthopedics. - Ambulatory referral to Orthopedic Surgery 2 traumatic suprapatellar bursitis I do not think that x-rays would benefit this patient now as orthopedics will do their own. I certainly think there is no fracture today. I have done the exam and reviewed the above chart and it is accurate to the best of my knowledge.  Miguel Aschoff MD [facility] 10/27/2015 1:42 PM

## 2015-11-26 DIAGNOSIS — M1711 Unilateral primary osteoarthritis, right knee: Secondary | ICD-10-CM | POA: Diagnosis not present

## 2015-11-28 ENCOUNTER — Other Ambulatory Visit: Payer: Self-pay | Admitting: Podiatry

## 2015-11-28 ENCOUNTER — Telehealth: Payer: Self-pay | Admitting: *Deleted

## 2015-11-28 NOTE — Telephone Encounter (Signed)
Faxed pt's Lyrica rx to [place].

## 2016-02-12 ENCOUNTER — Other Ambulatory Visit: Payer: Self-pay | Admitting: Family Medicine

## 2016-02-13 ENCOUNTER — Other Ambulatory Visit: Payer: Self-pay | Admitting: Family Medicine

## 2016-03-15 ENCOUNTER — Other Ambulatory Visit: Payer: Self-pay | Admitting: Family Medicine

## 2016-06-30 DIAGNOSIS — Z1231 Encounter for screening mammogram for malignant neoplasm of breast: Secondary | ICD-10-CM | POA: Diagnosis not present

## 2016-06-30 DIAGNOSIS — M810 Age-related osteoporosis without current pathological fracture: Secondary | ICD-10-CM | POA: Diagnosis not present

## 2016-06-30 LAB — HM MAMMOGRAPHY

## 2016-06-30 LAB — HM DEXA SCAN

## 2016-07-13 ENCOUNTER — Other Ambulatory Visit: Payer: Self-pay | Admitting: Podiatry

## 2016-07-13 ENCOUNTER — Telehealth: Payer: Self-pay | Admitting: *Deleted

## 2016-07-13 NOTE — Telephone Encounter (Addendum)
Pt informed pt the Lyrica would be at the [facility]. 07/14/2016-[facility] states they received a fax for Lyrica and they are not a pharmacy.

## 2016-09-23 ENCOUNTER — Encounter: Payer: Self-pay | Admitting: Family Medicine

## 2017-01-14 ENCOUNTER — Other Ambulatory Visit: Payer: Self-pay | Admitting: Podiatry

## 2017-01-19 ENCOUNTER — Telehealth: Payer: Self-pay | Admitting: *Deleted

## 2017-01-19 ENCOUNTER — Other Ambulatory Visit: Payer: Self-pay | Admitting: Podiatry

## 2017-01-19 NOTE — Telephone Encounter (Addendum)
[place] states pt says she spoke with [facility], concerning refill of the Lyrica. I told pharmacy staff member that I last spoke with pt 06/2016, and directed her to the [place] office.01/20/2017-[facility] - [place] states she is calling to get verbal orders for pt's Lyrica. I reviewed Medications and Orders and did find current order for the Lyrica 50mg . I gave verbal orders for Lyrica to [facility] and apologized for missing the prescription yesterday.

## 2017-01-26 DIAGNOSIS — H524 Presbyopia: Secondary | ICD-10-CM | POA: Diagnosis not present

## 2017-01-26 DIAGNOSIS — H5203 Hypermetropia, bilateral: Secondary | ICD-10-CM | POA: Diagnosis not present

## 2017-01-26 LAB — HM DIABETES EYE EXAM

## 2017-03-10 ENCOUNTER — Telehealth: Payer: Self-pay

## 2017-03-10 ENCOUNTER — Encounter: Payer: Self-pay | Admitting: Family Medicine

## 2017-03-10 ENCOUNTER — Ambulatory Visit (INDEPENDENT_AMBULATORY_CARE_PROVIDER_SITE_OTHER): Payer: 59 | Admitting: Family Medicine

## 2017-03-10 VITALS — BP 118/62 | HR 60 | Temp 98.1°F | Resp 12 | Ht 60.5 in | Wt 198.0 lb

## 2017-03-10 DIAGNOSIS — Z90710 Acquired absence of both cervix and uterus: Secondary | ICD-10-CM | POA: Diagnosis not present

## 2017-03-10 DIAGNOSIS — J301 Allergic rhinitis due to pollen: Secondary | ICD-10-CM | POA: Insufficient documentation

## 2017-03-10 DIAGNOSIS — E785 Hyperlipidemia, unspecified: Secondary | ICD-10-CM | POA: Insufficient documentation

## 2017-03-10 DIAGNOSIS — Z1211 Encounter for screening for malignant neoplasm of colon: Secondary | ICD-10-CM | POA: Diagnosis not present

## 2017-03-10 DIAGNOSIS — E784 Other hyperlipidemia: Secondary | ICD-10-CM

## 2017-03-10 DIAGNOSIS — R739 Hyperglycemia, unspecified: Secondary | ICD-10-CM | POA: Diagnosis not present

## 2017-03-10 DIAGNOSIS — M546 Pain in thoracic spine: Secondary | ICD-10-CM

## 2017-03-10 DIAGNOSIS — M816 Localized osteoporosis [Lequesne]: Secondary | ICD-10-CM | POA: Diagnosis not present

## 2017-03-10 DIAGNOSIS — R002 Palpitations: Secondary | ICD-10-CM

## 2017-03-10 DIAGNOSIS — Z0000 Encounter for general adult medical examination without abnormal findings: Secondary | ICD-10-CM | POA: Diagnosis not present

## 2017-03-10 DIAGNOSIS — Z23 Encounter for immunization: Secondary | ICD-10-CM | POA: Diagnosis not present

## 2017-03-10 DIAGNOSIS — E7849 Other hyperlipidemia: Secondary | ICD-10-CM

## 2017-03-10 DIAGNOSIS — Z8632 Personal history of gestational diabetes: Secondary | ICD-10-CM | POA: Insufficient documentation

## 2017-03-10 DIAGNOSIS — M545 Low back pain, unspecified: Secondary | ICD-10-CM

## 2017-03-10 DIAGNOSIS — L570 Actinic keratosis: Secondary | ICD-10-CM | POA: Diagnosis not present

## 2017-03-10 DIAGNOSIS — Z1159 Encounter for screening for other viral diseases: Secondary | ICD-10-CM | POA: Diagnosis not present

## 2017-03-10 DIAGNOSIS — M1990 Unspecified osteoarthritis, unspecified site: Secondary | ICD-10-CM | POA: Insufficient documentation

## 2017-03-10 DIAGNOSIS — I10 Essential (primary) hypertension: Secondary | ICD-10-CM | POA: Insufficient documentation

## 2017-03-10 DIAGNOSIS — G8929 Other chronic pain: Secondary | ICD-10-CM | POA: Insufficient documentation

## 2017-03-10 LAB — POCT UA - MICROALBUMIN: Microalbumin Ur, POC: 20 mg/L

## 2017-03-10 MED ORDER — ALENDRONATE SODIUM 70 MG PO TABS: 70.0000 mg | ORAL_TABLET | ORAL | 12 refills | Status: DC

## 2017-03-10 MED ORDER — POTASSIUM CHLORIDE ER 10 MEQ PO TBCR: 20.0000 meq | EXTENDED_RELEASE_TABLET | Freq: Every day | ORAL | 12 refills | Status: DC

## 2017-03-10 MED ORDER — HYDROCHLOROTHIAZIDE 25 MG PO TABS: 25.0000 mg | ORAL_TABLET | Freq: Every day | ORAL | 12 refills | Status: DC

## 2017-03-10 NOTE — Telephone Encounter (Signed)
Patient would like to go to [facility] for colonoscopy. Maybe [place]. Please schedule. Order placed-aa

## 2017-03-10 NOTE — Progress Notes (Signed)
Patient: Barbara Fletcher, Female    DOB: 1951/05/03, 66 y.o.   MRN: [id number] Visit Date: 03/10/2017  Today's Provider: [name], MD   Chief Complaint  Patient presents with  . Annual Exam   Subjective:  Barbara Fletcher is a 66 y.o. female who presents today for health maintenance and complete physical. She feels fairly well. She reports exercising she does do walking and stays active all day long. She reports she is sleeping well. Last mammogram 06/30/16 negative BMD 06/30/16 osteoporosis. Pap smear- when patient was 66 years old, CA125 levels came back elevated and total hysterectomy was done at that time. Colonoscopy 10/05/05  Review of Systems  Constitutional: Negative.   HENT: Positive for congestion and rhinorrhea.   Eyes:       Dry eyes  Respiratory: Negative.   Cardiovascular: Positive for palpitations (sometimes).  Gastrointestinal: Negative.   Endocrine: Negative.   Genitourinary: Negative.   Musculoskeletal: Positive for arthralgias, back pain, neck pain and neck stiffness.  Skin: Negative.   Allergic/Immunologic: Negative.   Neurological: Positive for numbness (feet).  Hematological: Negative.   Psychiatric/Behavioral: Negative.     Social History   Social History  . Marital status: Married    Spouse name: N/A  . Number of children: N/A  . Years of education: N/A   Occupational History  . Not on file.   Social History Main Topics  . Smoking status: Never Smoker  . Smokeless tobacco: Never Used  . Alcohol use 0.6 oz/week    1 Glasses of wine per week     Comment: occ  . Drug use: No  . Sexual activity: Not on file   Other Topics Concern  . Not on file   Social History Narrative  . No narrative on file    Patient Active Problem List   Diagnosis Date Noted  . Hypertension 03/10/2017  . Hyperlipidemia 03/10/2017  . Arthritis 03/10/2017  . Seasonal allergic rhinitis due to pollen 03/10/2017  . Urinary tract infection 04/19/2015     Past Surgical History:  Procedure Laterality Date  . ABDOMINAL HYSTERECTOMY  1990  . BACK SURGERY     lumbar fussion  . [place]  . CHOLECYSTECTOMY  1986  . COLOSTOMY    . COLOSTOMY CLOSURE     abd diverticulitis with colostomy, reversal 2007- 2008  . LAPAROTOMY  02/13/2012   Procedure: EXPLORATORY LAPAROTOMY;  Surgeon: Rolm Bookbinder, MD;  Location: [facility] OR;  Service: General;  Laterality: N/A;  Exploratory Laparotomy with small bowel resection, lysis of adhension and primary ventral hernia repair  . OOPHORECTOMY     1981  . TONSILLECTOMY     1958    Her family history includes Heart failure in her mother.     Outpatient Encounter Prescriptions as of 03/10/2017  Medication Sig Note  . fexofenadine-pseudoephedrine (ALLEGRA-D 24) 180-240 MG per 24 hr tablet Take 1 tablet by mouth daily as needed. Seasonal allergies    . hydrochlorothiazide (HYDRODIURIL) 25 MG tablet TAKE 1 TABLET BY MOUTH ONCE DAILY IN THE MORNING   . ibuprofen (ADVIL,MOTRIN) 200 MG tablet Take 200 mg by mouth every 6 (six) hours as needed. For pain   . LYRICA 50 MG capsule TAKE ONE CAPSULE BY MOUTH 2 TIMES A DAY   . metoprolol tartrate (LOPRESSOR) 25 MG tablet TAKE 1 TABLET BY MOUTH 3 TIMES DAILY   . potassium chloride (K-DUR) 10 MEQ tablet TAKE 2 TABLETS BY MOUTH DAILY   . meloxicam (  MOBIC) 15 MG tablet Take 1 tablet (15 mg total) by mouth daily. (Patient not taking: Reported on 03/10/2017)   . [DISCONTINUED] diazepam (VALIUM) 2 MG tablet Take 2 mg by mouth as needed for anxiety.   . [DISCONTINUED] potassium chloride (K-DUR,KLOR-CON) 10 MEQ tablet  10/27/2015: Received from: External Pharmacy   No facility-administered encounter medications on file as of 03/10/2017.     Patient Care Team: Jerrol Banana., MD as PCP - General (Unknown Physician Specialty)      Objective:   Vitals:  Vitals:   03/10/17 0924  BP: 118/62  Pulse: 60  Resp: 12  Temp: 98.1 F (36.7 C)  Weight:  198 lb (89.8 kg)  Height: 5' 0.5" (1.537 m)    Physical Exam  Constitutional: She is oriented to person, place, and time. She appears well-developed and well-nourished.  HENT:  Head: Normocephalic and atraumatic.  Right Ear: External ear normal.  Left Ear: External ear normal.  Mouth/Throat: Oropharynx is clear and moist.  Eyes: Pupils are equal, round, and reactive to light. Conjunctivae are normal.  Neck: Normal range of motion. Neck supple.  Cardiovascular: Normal rate, regular rhythm, normal heart sounds and intact distal pulses.  Exam reveals no gallop.   No murmur heard. Pulmonary/Chest: Effort normal and breath sounds normal. No respiratory distress. She has no wheezes. Right breast exhibits no mass, no nipple discharge and no skin change. Left breast exhibits no mass, no nipple discharge and no skin change.  Abdominal: Soft. She exhibits no distension. There is no tenderness. There is no rebound.  Genitourinary:  Genitourinary Comments: defer  Musculoskeletal: She exhibits no edema or tenderness.  Neurological: She is alert and oriented to person, place, and time. No cranial nerve deficit. Coordination normal.  Skin: Skin is warm and dry. No rash noted. No erythema.  Psychiatric: She has a normal mood and affect. Her behavior is normal. Judgment and thought content normal.   Depression Screen PHQ 2/9 Scores 03/10/2017 03/10/2017  PHQ - 2 Score 0 0  PHQ- 9 Score 0 -  Discussed health benefits of physical activity, and encouraged her to engage in regular exercise appropriate for her age and condition.    Assessment & Plan:  1. Annual physical exam Defer DRE and pelvic exam today. Patient had total hysterectomy, patient is due for colonoscopy referral placed for this. Patient asked about Shingles injection and patient advised to check with her insurance on the cost. Will do pelvic exam in 2019-patient wants to wait till next year. No vaginal concerns from patient. - TSH  2.  Essential hypertension Stable. - CBC with Differential/Platelet - Comprehensive metabolic panel  3. Other hyperlipidemia - Comprehensive metabolic panel - Lipid Panel With LDL/HDL Ratio  4. Hyperglycemia Check lab work. - HgB A1c - POCT UA - Microalbumin  5. Arthritis Stable. Discussed with patient exercises that patient can try with the pain she has. Encouraged to continue working out at [employer].  6. Seasonal allergic rhinitis due to pollen  7. Palpitations Stable at this time. Will follow. If symptoms get worse or persist offered patient referral to cardiologist at any time. - EKG 12-Lead  8. Colon cancer screening Refer to GI in [place]. - Ambulatory referral to Gastroenterology  9. Need for hepatitis  C screening test - Hepatitis C Antibody  10. Localized osteoporosis without current pathological fracture Start Fosamax, repeat BMD in 2020. - alendronate (FOSAMAX) 70 MG tablet; Take 1 tablet (70 mg total) by mouth every 7 (seven) days.  Take with a full glass of water on an empty stomach.  Dispense: 4 tablet; Refill: 12  11. AK (actinic keratosis) Refer to Dr Nehemiah Massed. - Ambulatory referral to Dermatology  12. S/P total hysterectomy 13. Chronic thoracic and lumbar back pain Does get worse later in the day. Discussed back exercises and strengthening back muscles. Follow. 14.Obesity D and E discussed. HPI, Exam and A&P transcribed by Theressa Millard, RMA under direction and in the presence of Miguel Aschoff, MD. I have done the exam and reviewed the chart and it is accurate to the best of my knowledge. Development worker, community has been used and  any errors in dictation or transcription are unintentional. Miguel Aschoff M.D. [facility]

## 2017-03-11 ENCOUNTER — Telehealth: Payer: Self-pay

## 2017-03-11 ENCOUNTER — Other Ambulatory Visit: Payer: Self-pay

## 2017-03-11 LAB — LIPID PANEL WITH LDL/HDL RATIO
Cholesterol, Total: 211 mg/dL — ABNORMAL HIGH (ref 100–199)
HDL: 60 mg/dL (ref >39)
LDL Calculated: 115 mg/dL — ABNORMAL HIGH (ref 0–99)
LDl/HDL Ratio: 1.9 ratio (ref 0.0–3.2)
Triglycerides: 182 mg/dL — ABNORMAL HIGH (ref 0–149)
VLDL Cholesterol Cal: 36 mg/dL (ref 5–40)

## 2017-03-11 LAB — CBC WITH DIFFERENTIAL/PLATELET
Basophils Absolute: 0 10*3/uL (ref 0.0–0.2)
Basos: 0 %
EOS (ABSOLUTE): 0.3 10*3/uL (ref 0.0–0.4)
Eos: 5 %
Hematocrit: 41.1 % (ref 34.0–46.6)
Hemoglobin: 13.8 g/dL (ref 11.1–15.9)
Immature Grans (Abs): 0 10*3/uL (ref 0.0–0.1)
Immature Granulocytes: 0 %
Lymphocytes Absolute: 1.4 10*3/uL (ref 0.7–3.1)
Lymphs: 28 %
MCH: 30.6 pg (ref 26.6–33.0)
MCHC: 33.6 g/dL (ref 31.5–35.7)
MCV: 91 fL (ref 79–97)
Monocytes Absolute: 0.4 10*3/uL (ref 0.1–0.9)
Monocytes: 8 %
Neutrophils Absolute: 2.9 10*3/uL (ref 1.4–7.0)
Neutrophils: 59 %
Platelets: 279 10*3/uL (ref 150–379)
RBC: 4.51 x10E6/uL (ref 3.77–5.28)
RDW: 14 % (ref 12.3–15.4)
WBC: 5 10*3/uL (ref 3.4–10.8)

## 2017-03-11 LAB — COMPREHENSIVE METABOLIC PANEL
ALT: 16 IU/L (ref 0–32)
AST: 22 IU/L (ref 0–40)
Albumin/Globulin Ratio: 1.5 (ref 1.2–2.2)
Albumin: 4.3 g/dL (ref 3.6–4.8)
Alkaline Phosphatase: 90 IU/L (ref 39–117)
BUN/Creatinine Ratio: 21 (ref 12–28)
BUN: 27 mg/dL (ref 8–27)
Bilirubin Total: 0.3 mg/dL (ref 0.0–1.2)
CO2: 22 mmol/L (ref 20–29)
Calcium: 10.1 mg/dL (ref 8.7–10.3)
Chloride: 102 mmol/L (ref 96–106)
Creatinine, Ser: 1.27 mg/dL — ABNORMAL HIGH (ref 0.57–1.00)
GFR calc Af Amer: 51 mL/min/{1.73_m2} — ABNORMAL LOW (ref >59)
GFR calc non Af Amer: 44 mL/min/{1.73_m2} — ABNORMAL LOW (ref >59)
Globulin, Total: 2.9 g/dL (ref 1.5–4.5)
Glucose: 112 mg/dL — ABNORMAL HIGH (ref 65–99)
Potassium: 4.6 mmol/L (ref 3.5–5.2)
Sodium: 142 mmol/L (ref 134–144)
Total Protein: 7.2 g/dL (ref 6.0–8.5)

## 2017-03-11 LAB — HEMOGLOBIN A1C
Est. average glucose Bld gHb Est-mCnc: 128 mg/dL
Hgb A1c MFr Bld: 6.1 % — ABNORMAL HIGH (ref 4.8–5.6)

## 2017-03-11 LAB — TSH: TSH: 0.176 u[IU]/mL — ABNORMAL LOW (ref 0.450–4.500)

## 2017-03-11 LAB — HEPATITIS C ANTIBODY: Hep C Virus Ab: <0.1 s/co ratio (ref 0.0–0.9)

## 2017-03-11 MED ORDER — LOSARTAN POTASSIUM 50 MG PO TABS: 50.0000 mg | ORAL_TABLET | Freq: Every day | ORAL | 3 refills | Status: DC

## 2017-03-11 NOTE — Telephone Encounter (Signed)
Patient will continue Metoprolol daily only as she has been taking. Patient advised to stop HCTZ. And we will re check in on the next visit-aa

## 2017-03-11 NOTE — Telephone Encounter (Signed)
Do not do losartan. Try no meds and recheck 1 month

## 2017-03-11 NOTE — Telephone Encounter (Signed)
Per lab results wanted to switch Barbara Fletcher from HCTZ to losartan but she is allergic to lisinopril and contradiction came up. Barbara Fletcher states when she took lisinopril one day she developed swelling in her lips major and was told by Dr Rosanna Randy to stop taking this medication. Barbara Fletcher did not have any breathing issues at the time of episode. How would you like to proceed? -aa

## 2017-03-16 ENCOUNTER — Encounter: Payer: Self-pay | Admitting: Family Medicine

## 2017-04-11 ENCOUNTER — Other Ambulatory Visit: Payer: Self-pay | Admitting: Family Medicine

## 2017-05-03 ENCOUNTER — Ambulatory Visit (INDEPENDENT_AMBULATORY_CARE_PROVIDER_SITE_OTHER): Payer: 59 | Admitting: Family Medicine

## 2017-05-03 VITALS — BP 118/84 | HR 66 | Temp 98.1°F | Resp 16 | Wt 196.0 lb

## 2017-05-03 DIAGNOSIS — M79645 Pain in left finger(s): Secondary | ICD-10-CM

## 2017-05-03 DIAGNOSIS — M816 Localized osteoporosis [Lequesne]: Secondary | ICD-10-CM | POA: Diagnosis not present

## 2017-05-03 DIAGNOSIS — R946 Abnormal results of thyroid function studies: Secondary | ICD-10-CM | POA: Diagnosis not present

## 2017-05-03 DIAGNOSIS — R7989 Other specified abnormal findings of blood chemistry: Secondary | ICD-10-CM

## 2017-05-03 DIAGNOSIS — I10 Essential (primary) hypertension: Secondary | ICD-10-CM

## 2017-05-03 DIAGNOSIS — N289 Disorder of kidney and ureter, unspecified: Secondary | ICD-10-CM | POA: Diagnosis not present

## 2017-05-03 DIAGNOSIS — Z2821 Immunization not carried out because of patient refusal: Secondary | ICD-10-CM

## 2017-05-03 NOTE — Progress Notes (Signed)
Barbara Fletcher  MRN: [id number] DOB: Jan 02, 1951  Subjective:  HPI  Patient is here for follow up. Last office visit was on 03/10/17. At that time blood work was checked and kidney function was decreased. Wanted to have patient take Losartan and stop HCTZ at that time but patient is allergic to Lisinopril. Patient was advised at that time to stop HCTZ and not not start any other medication and re check today. Patient is taking Metoprolol still. BP Readings from Last 3 Encounters:  05/03/17 118/84  03/10/17 118/62  10/27/15 128/66   Wt Readings from Last 3 Encounters:  05/03/17 196 lb (88.9 kg)  03/10/17 198 lb (89.8 kg)  10/27/15 201 lb (91.2 kg)   Palpitations were addressed last time also. She had 2 episodes of this after last visit a week later but not since then. EKG was done last time.  Patient was also started on Fosamax last time for osteoporosis and she is tolerating medication well. Patient Active Problem List   Diagnosis Date Noted  . Hypertension 03/10/2017  . Hyperlipidemia 03/10/2017  . Arthritis 03/10/2017  . Seasonal allergic rhinitis due to pollen 03/10/2017  . History of gestational diabetes 03/10/2017  . Hyperglycemia 03/10/2017  . Chronic bilateral thoracic back pain 03/10/2017  . Chronic bilateral low back pain without sciatica 03/10/2017  . S/P total hysterectomy 03/10/2017  . AK (actinic keratosis) 03/10/2017  . Localized osteoporosis without current pathological fracture 03/10/2017  . Urinary tract infection 04/19/2015    Past Medical History:  Diagnosis Date  . Arthritis   . Diverticulitis   . Hypertension     Social History   Social History  . Marital status: Married    Spouse name: N/A  . Number of children: N/A  . Years of education: N/A   Occupational History  . Not on file.   Social History Main Topics  . Smoking status: Never Smoker  . Smokeless tobacco: Never Used  . Alcohol use 0.6 oz/week    1 Glasses of wine per week   Comment: occ  . Drug use: No  . Sexual activity: Not on file   Other Topics Concern  . Not on file   Social History Narrative  . No narrative on file    Outpatient Encounter Prescriptions as of 05/03/2017  Medication Sig  . alendronate (FOSAMAX) 70 MG tablet Take 1 tablet (70 mg total) by mouth every 7 (seven) days. Take with a full glass of water on an empty stomach.  . fexofenadine-pseudoephedrine (ALLEGRA-D 24) 180-240 MG per 24 hr tablet Take 1 tablet by mouth daily as needed. Seasonal allergies   . ibuprofen (ADVIL,MOTRIN) 200 MG tablet Take 200 mg by mouth every 6 (six) hours as needed. For pain  . LYRICA 50 MG capsule TAKE ONE CAPSULE BY MOUTH 2 TIMES A DAY  . metoprolol tartrate (LOPRESSOR) 25 MG tablet TAKE 1 TABLET BY MOUTH 3 TIMES DAILY  . [DISCONTINUED] meloxicam (MOBIC) 15 MG tablet Take 1 tablet (15 mg total) by mouth daily.  . [DISCONTINUED] potassium chloride (K-DUR) 10 MEQ tablet Take 2 tablets (20 mEq total) by mouth daily.   No facility-administered encounter medications on file as of 05/03/2017.     Allergies  Allergen Reactions  . Levsin [Hyoscyamine Sulfate] Hives and Shortness Of Breath  . Lisinopril Swelling    Facial swelling, no breathing issues    Review of Systems  Constitutional: Negative.   HENT: Negative.   Eyes: Negative.   Respiratory: Negative.  Cardiovascular: Negative.   Gastrointestinal: Negative.   Musculoskeletal: Positive for joint pain.  Skin: Negative.   Neurological:       Burning sensation in the left wrist and left thumb.  Endo/Heme/Allergies: Negative.   Psychiatric/Behavioral: Negative.     Objective:  BP 118/84   Pulse 66   Temp 98.1 F (36.7 C)   Resp 16   Wt 196 lb (88.9 kg)   BMI 37.65 kg/m   Physical Exam  Constitutional: She is oriented to person, place, and time and well-developed, well-nourished, and in no distress.  HENT:  Head: Normocephalic and atraumatic.  Eyes: Pupils are equal, round, and reactive to  light. Conjunctivae are normal.  Neck: Normal range of motion. Neck supple.  Cardiovascular: Normal rate, regular rhythm, normal heart sounds and intact distal pulses.  Exam reveals no gallop.   No murmur heard. Pulmonary/Chest: Effort normal and breath sounds normal. No respiratory distress. She has no wheezes.  Musculoskeletal:  Mild pain at the base of the left thumb  Neurological: She is alert and oriented to person, place, and time. No cranial nerve deficit. Gait normal. Coordination normal.  Psychiatric: Mood, memory, affect and judgment normal.    Assessment and Plan :  1. Essential hypertension stable off HCTZ. re check lab work today. - Renal Function Panel  2. Abnormal thyroid stimulating hormone level Re check level. - TSH  3. Abnormal kidney function - Renal Function Panel  4. Pain of left thumb Refer to Dr. Veronia Beets.. - Ambulatory referral to Hand Surgery  5. Influenza vaccination declined by patient  6. Osteoporosis Will stay on Fosamax till 2023.  HPI, Exam and A&P transcribed by Tiffany Kocher, RMA under direction and in the presence of Miguel Aschoff, MD. I have done the exam and reviewed the chart and it is accurate to the best of my knowledge. Development worker, community has been used and  any errors in dictation or transcription are unintentional. Miguel Aschoff M.D. [facility]

## 2017-05-04 LAB — RENAL FUNCTION PANEL
Albumin: 4.3 g/dL (ref 3.6–4.8)
BUN/Creatinine Ratio: 19 (ref 12–28)
BUN: 18 mg/dL (ref 8–27)
CO2: 23 mmol/L (ref 20–29)
Calcium: 9.8 mg/dL (ref 8.7–10.3)
Chloride: 104 mmol/L (ref 96–106)
Creatinine, Ser: 0.93 mg/dL (ref 0.57–1.00)
GFR calc Af Amer: 75 mL/min/{1.73_m2} (ref >59)
GFR calc non Af Amer: 65 mL/min/{1.73_m2} (ref >59)
Glucose: 107 mg/dL — ABNORMAL HIGH (ref 65–99)
Phosphorus: 3.2 mg/dL (ref 2.5–4.5)
Potassium: 4.6 mmol/L (ref 3.5–5.2)
Sodium: 143 mmol/L (ref 134–144)

## 2017-05-04 LAB — TSH: TSH: 1.79 u[IU]/mL (ref 0.450–4.500)

## 2017-05-12 ENCOUNTER — Encounter: Payer: Self-pay | Admitting: Family Medicine

## 2017-06-22 DIAGNOSIS — M1812 Unilateral primary osteoarthritis of first carpometacarpal joint, left hand: Secondary | ICD-10-CM | POA: Diagnosis not present

## 2017-06-22 DIAGNOSIS — M180 Bilateral primary osteoarthritis of first carpometacarpal joints: Secondary | ICD-10-CM | POA: Diagnosis not present

## 2017-06-22 DIAGNOSIS — M79645 Pain in left finger(s): Secondary | ICD-10-CM | POA: Diagnosis not present

## 2017-08-31 DIAGNOSIS — Z1231 Encounter for screening mammogram for malignant neoplasm of breast: Secondary | ICD-10-CM | POA: Diagnosis not present

## 2017-08-31 LAB — HM MAMMOGRAPHY

## 2017-09-28 ENCOUNTER — Other Ambulatory Visit: Payer: Self-pay | Admitting: Podiatry

## 2017-10-31 ENCOUNTER — Other Ambulatory Visit: Payer: Self-pay

## 2017-10-31 ENCOUNTER — Ambulatory Visit (INDEPENDENT_AMBULATORY_CARE_PROVIDER_SITE_OTHER): Payer: 59 | Admitting: Family Medicine

## 2017-10-31 VITALS — BP 150/90 | HR 72 | Temp 97.9°F | Resp 16

## 2017-10-31 DIAGNOSIS — I10 Essential (primary) hypertension: Secondary | ICD-10-CM

## 2017-10-31 DIAGNOSIS — R739 Hyperglycemia, unspecified: Secondary | ICD-10-CM

## 2017-10-31 MED ORDER — METOPROLOL TARTRATE 25 MG PO TABS: 37.5000 mg | ORAL_TABLET | Freq: Two times a day (BID) | ORAL | 12 refills | Status: DC

## 2017-10-31 NOTE — Progress Notes (Signed)
Barbara Fletcher  MRN: [id number] DOB: Jan 18, 1951  Subjective:  HPI   The patient is a 67 year old female who presents for follow up of her hypertension.  She has been off of her HCTZ and K+ since the fall.  She has been getting blood pressure readings outside of the office mainly in the 130-140 range over 70-80.  On occasion when she has episodes of palpitations she has increased heart rate (about 100) and increased pressure (about 200/100).  She notices these episodes mainly at night.  When this happens she takes an extra Metoprolol and in about 1-1.5 hours it will get better.  She denies any chest pain or SOB with the episodes.    Patient Active Problem List   Diagnosis Date Noted  . Hypertension 03/10/2017  . Hyperlipidemia 03/10/2017  . Arthritis 03/10/2017  . Seasonal allergic rhinitis due to pollen 03/10/2017  . History of gestational diabetes 03/10/2017  . Hyperglycemia 03/10/2017  . Chronic bilateral thoracic back pain 03/10/2017  . Chronic bilateral low back pain without sciatica 03/10/2017  . S/P total hysterectomy 03/10/2017  . AK (actinic keratosis) 03/10/2017  . Localized osteoporosis without current pathological fracture 03/10/2017  . Urinary tract infection 04/19/2015    Past Medical History:  Diagnosis Date  . Arthritis   . Diverticulitis   . Hypertension     Social History   Socioeconomic History  . Marital status: Married    Spouse name: Not on file  . Number of children: Not on file  . Years of education: Not on file  . Highest education level: Not on file  Social Needs  . Financial resource strain: Not on file  . Food insecurity - worry: Not on file  . Food insecurity - inability: Not on file  . Transportation needs - medical: Not on file  . Transportation needs - non-medical: Not on file  Occupational History  . Not on file  Tobacco Use  . Smoking status: Never Smoker  . Smokeless tobacco: Never Used  Substance and Sexual Activity  .  Alcohol use: Yes    Alcohol/week: 0.6 oz    Types: 1 Glasses of wine per week    Comment: occ  . Drug use: No  . Sexual activity: Not on file  Other Topics Concern  . Not on file  Social History Narrative  . Not on file    Outpatient Encounter Medications as of 10/31/2017  Medication Sig  . alendronate (FOSAMAX) 70 MG tablet Take 1 tablet (70 mg total) by mouth every 7 (seven) days. Take with a full glass of water on an empty stomach.  . fexofenadine-pseudoephedrine (ALLEGRA-D 24) 180-240 MG per 24 hr tablet Take 1 tablet by mouth daily as needed. Seasonal allergies   . ibuprofen (ADVIL,MOTRIN) 200 MG tablet Take 200 mg by mouth every 6 (six) hours as needed. For pain  . LYRICA 50 MG capsule TAKE ONE CAPSULE BY MOUTH 2 TIMES A DAY  . metoprolol tartrate (LOPRESSOR) 25 MG tablet TAKE 1 TABLET BY MOUTH 3 TIMES DAILY (Patient taking differently: TAKE 1 TABLET BY MOUTH 2 TIMES DAILY)   No facility-administered encounter medications on file as of 10/31/2017.     Allergies  Allergen Reactions  . Levsin [Hyoscyamine Sulfate] Hives and Shortness Of Breath  . Lisinopril Swelling    Facial swelling, no breathing issues    Review of Systems  Constitutional: Positive for malaise/fatigue (maybe a little over the last month.  Patient notes some cold symptoms  with it.).  HENT: Negative.   Eyes: Negative.   Respiratory: Negative for cough, shortness of breath and wheezing.   Cardiovascular: Positive for palpitations. Negative for chest pain, orthopnea, claudication and leg swelling.  Gastrointestinal: Negative.   Skin: Negative.   Neurological: Negative for weakness.  Endo/Heme/Allergies: Negative.   Psychiatric/Behavioral: Negative.     Objective:  BP (!) 150/90 (BP Location: Left Arm, Cuff Size: Normal)   Pulse 72   Temp 97.9 F (36.6 C) (Oral)   Resp 16   Physical Exam  Constitutional: She is oriented to person, place, and time and well-developed, well-nourished, and in no distress.    HENT:  Head: Normocephalic and atraumatic.  Eyes: Conjunctivae are normal.  Neck: No thyromegaly present.  Cardiovascular: Normal rate, regular rhythm, normal heart sounds and intact distal pulses.  Pulmonary/Chest: Effort normal and breath sounds normal.  Abdominal: Soft.  Lymphadenopathy:    She has no cervical adenopathy.  Neurological: She is alert and oriented to person, place, and time. Gait normal. GCS score is 15.  Skin: Skin is warm and dry.  Psychiatric: Mood, memory, affect and judgment normal.    Assessment and Plan :  1. Essential hypertension  - Renal function panel - metoprolol tartrate (LOPRESSOR) 25 MG tablet; Take 1.5 tablets (37.5 mg total) by mouth 2 (two) times daily.  Dispense: 90 tablet; Refill: 12  2. Hyperglycemia  - Hemoglobin A1c  I have done the exam and reviewed the chart and it is accurate to the best of my knowledge. Development worker, community has been used and  any errors in dictation or transcription are unintentional. Miguel Aschoff M.D. [facility]

## 2017-11-01 LAB — RENAL FUNCTION PANEL
Albumin: 4.2 g/dL (ref 3.6–4.8)
BUN/Creatinine Ratio: 24 (ref 12–28)
BUN: 20 mg/dL (ref 8–27)
CO2: 22 mmol/L (ref 20–29)
Calcium: 9.5 mg/dL (ref 8.7–10.3)
Chloride: 106 mmol/L (ref 96–106)
Creatinine, Ser: 0.85 mg/dL (ref 0.57–1.00)
GFR calc Af Amer: 83 mL/min/{1.73_m2} (ref >59)
GFR calc non Af Amer: 72 mL/min/{1.73_m2} (ref >59)
Glucose: 104 mg/dL — ABNORMAL HIGH (ref 65–99)
Phosphorus: 3.2 mg/dL (ref 2.5–4.5)
Potassium: 4.5 mmol/L (ref 3.5–5.2)
Sodium: 141 mmol/L (ref 134–144)

## 2017-11-01 LAB — HEMOGLOBIN A1C
Est. average glucose Bld gHb Est-mCnc: 126 mg/dL
Hgb A1c MFr Bld: 6 % — ABNORMAL HIGH (ref 4.8–5.6)

## 2017-11-09 ENCOUNTER — Encounter: Payer: Self-pay | Admitting: Podiatry

## 2017-11-09 ENCOUNTER — Ambulatory Visit (INDEPENDENT_AMBULATORY_CARE_PROVIDER_SITE_OTHER): Payer: 59

## 2017-11-09 ENCOUNTER — Ambulatory Visit: Payer: 59 | Admitting: Podiatry

## 2017-11-09 DIAGNOSIS — G629 Polyneuropathy, unspecified: Secondary | ICD-10-CM | POA: Diagnosis not present

## 2017-11-09 MED ORDER — PREGABALIN 75 MG PO CAPS: 75.0000 mg | ORAL_CAPSULE | Freq: Two times a day (BID) | ORAL | 5 refills | Status: DC

## 2017-11-09 NOTE — Progress Notes (Signed)
She presents today for follow-up of her chronic neuropathy bilaterally.  States that she needs a refill on her Lyrica.  She states the feet appear to be burning and tingling a little bit more.  States that is not consistent and it is not every day.  Objective: I reviewed her past medical history medications allergy surgeries and social history.  No change in her past medical history.  Pulses remain palpable no change in neurological findings.  Assessment neuropathy bilateral.  Plan: Increased her Lyrica to 75 mg twice a day.

## 2017-11-16 DIAGNOSIS — B078 Other viral warts: Secondary | ICD-10-CM | POA: Diagnosis not present

## 2017-11-16 DIAGNOSIS — I8393 Asymptomatic varicose veins of bilateral lower extremities: Secondary | ICD-10-CM | POA: Diagnosis not present

## 2017-11-16 DIAGNOSIS — D1800 Hemangioma unspecified site: Secondary | ICD-10-CM | POA: Diagnosis not present

## 2017-11-16 DIAGNOSIS — L821 Other seborrheic keratosis: Secondary | ICD-10-CM | POA: Diagnosis not present

## 2017-11-16 DIAGNOSIS — B359 Dermatophytosis, unspecified: Secondary | ICD-10-CM | POA: Diagnosis not present

## 2017-11-16 DIAGNOSIS — L573 Poikiloderma of Civatte: Secondary | ICD-10-CM | POA: Diagnosis not present

## 2017-11-16 DIAGNOSIS — L570 Actinic keratosis: Secondary | ICD-10-CM | POA: Diagnosis not present

## 2017-11-16 DIAGNOSIS — Z85828 Personal history of other malignant neoplasm of skin: Secondary | ICD-10-CM | POA: Diagnosis not present

## 2017-11-24 DIAGNOSIS — C4492 Squamous cell carcinoma of skin, unspecified: Secondary | ICD-10-CM

## 2017-11-24 DIAGNOSIS — D048 Carcinoma in situ of skin of other sites: Secondary | ICD-10-CM | POA: Diagnosis not present

## 2017-11-24 DIAGNOSIS — D485 Neoplasm of uncertain behavior of skin: Secondary | ICD-10-CM | POA: Diagnosis not present

## 2017-11-24 HISTORY — DX: Squamous cell carcinoma of skin, unspecified: C44.92

## 2017-12-28 ENCOUNTER — Ambulatory Visit: Payer: 59 | Admitting: Podiatry

## 2017-12-28 DIAGNOSIS — D048 Carcinoma in situ of skin of other sites: Secondary | ICD-10-CM | POA: Diagnosis not present

## 2017-12-28 DIAGNOSIS — L570 Actinic keratosis: Secondary | ICD-10-CM | POA: Diagnosis not present

## 2018-01-11 DIAGNOSIS — D099 Carcinoma in situ, unspecified: Secondary | ICD-10-CM | POA: Diagnosis not present

## 2018-01-11 DIAGNOSIS — C4499 Other specified malignant neoplasm of skin, unspecified: Secondary | ICD-10-CM | POA: Diagnosis not present

## 2018-02-01 DIAGNOSIS — H524 Presbyopia: Secondary | ICD-10-CM | POA: Diagnosis not present

## 2018-02-13 ENCOUNTER — Ambulatory Visit: Payer: Self-pay | Admitting: Family Medicine

## 2018-02-14 ENCOUNTER — Telehealth: Payer: Self-pay

## 2018-02-14 NOTE — Telephone Encounter (Signed)
Called pt to schedule AWV prior to CPE and pt declined having this apt. Will remove from list. -MM

## 2018-02-15 DIAGNOSIS — D0461 Carcinoma in situ of skin of right upper limb, including shoulder: Secondary | ICD-10-CM | POA: Diagnosis not present

## 2018-03-13 ENCOUNTER — Encounter: Payer: Self-pay | Admitting: Family Medicine

## 2018-04-06 ENCOUNTER — Other Ambulatory Visit: Payer: Self-pay | Admitting: Family Medicine

## 2018-04-06 DIAGNOSIS — M816 Localized osteoporosis [Lequesne]: Secondary | ICD-10-CM

## 2018-06-05 ENCOUNTER — Telehealth: Payer: Self-pay | Admitting: *Deleted

## 2018-06-05 ENCOUNTER — Other Ambulatory Visit: Payer: Self-pay | Admitting: Podiatry

## 2018-06-05 NOTE — Telephone Encounter (Signed)
Faxed Lyrica to [facility].

## 2018-06-16 ENCOUNTER — Other Ambulatory Visit: Payer: Self-pay | Admitting: Family Medicine

## 2018-06-21 ENCOUNTER — Ambulatory Visit (INDEPENDENT_AMBULATORY_CARE_PROVIDER_SITE_OTHER): Payer: 59 | Admitting: Family Medicine

## 2018-06-21 VITALS — BP 183/93 | HR 58 | Temp 98.5°F | Resp 16 | Wt 187.0 lb

## 2018-06-21 DIAGNOSIS — Z6835 Body mass index (BMI) 35.0-35.9, adult: Secondary | ICD-10-CM | POA: Diagnosis not present

## 2018-06-21 DIAGNOSIS — Z0000 Encounter for general adult medical examination without abnormal findings: Secondary | ICD-10-CM

## 2018-06-21 DIAGNOSIS — M1990 Unspecified osteoarthritis, unspecified site: Secondary | ICD-10-CM

## 2018-06-21 DIAGNOSIS — I10 Essential (primary) hypertension: Secondary | ICD-10-CM | POA: Diagnosis not present

## 2018-06-21 MED ORDER — AMLODIPINE BESYLATE 5 MG PO TABS: 5.0000 mg | ORAL_TABLET | Freq: Every day | ORAL | 3 refills | Status: DC

## 2018-06-21 NOTE — Progress Notes (Signed)
Patient: Barbara Fletcher, Female    DOB: 1950-11-18, 67 y.o.   MRN: [id number] Visit Date: 06/21/2018  Today's Provider: [name], MD   Chief Complaint  Patient presents with  . Annual Exam   Subjective:  Barbara Fletcher is a 67 y.o. female who presents today for health maintenance and complete physical. She feels fairly well. She reports exercising 3 times weekly. She reports she is sleeping well.  Immunization History  Administered Date(s) Administered  . Influenza Split 09/11/2011  . Pneumococcal Conjugate-13 03/10/2017  . Tdap 03/10/2017    08/31/17 Mammogram-negative 06/30/16 BMD-Osteoporosis  Patient is scheduled for her Mammogram. BMD and she will schedule her colonoscopy.  Review of Systems  Constitutional: Negative.   HENT: Negative.   Eyes: Negative.   Respiratory: Negative.   Cardiovascular: Negative.  Negative for palpitations.  Gastrointestinal: Negative.   Endocrine: Negative.   Genitourinary: Negative.   Musculoskeletal: Positive for arthralgias, back pain, gait problem, joint swelling, myalgias, neck pain and neck stiffness.  Skin: Negative.   Allergic/Immunologic: Negative.   Hematological: Negative.   Psychiatric/Behavioral: Negative.     Social History   Socioeconomic History  . Marital status: Married    Spouse name: Not on file  . Number of children: Not on file  . Years of education: Not on file  . Highest education level: Not on file  Occupational History  . Not on file  Social Needs  . Financial resource strain: Not on file  . Food insecurity:    Worry: Not on file    Inability: Not on file  . Transportation needs:    Medical: Not on file    Non-medical: Not on file  Tobacco Use  . Smoking status: Never Smoker  . Smokeless tobacco: Never Used  Substance and Sexual Activity  . Alcohol use: Yes    Alcohol/week: 1.0 standard drinks    Types: 1 Glasses of wine per week    Comment: occ  . Drug use: No  . Sexual activity:  Not on file  Lifestyle  . Physical activity:    Days per week: Not on file    Minutes per session: Not on file  . Stress: Not on file  Relationships  . Social connections:    Talks on phone: Not on file    Gets together: Not on file    Attends religious service: Not on file    Active member of club or organization: Not on file    Attends meetings of clubs or organizations: Not on file    Relationship status: Not on file  . Intimate partner violence:    Fear of current or ex partner: Not on file    Emotionally abused: Not on file    Physically abused: Not on file    Forced sexual activity: Not on file  Other Topics Concern  . Not on file  Social History Narrative  . Not on file    Patient Active Problem List   Diagnosis Date Noted  . Hypertension 03/10/2017  . Hyperlipidemia 03/10/2017  . Arthritis 03/10/2017  . Seasonal allergic rhinitis due to pollen 03/10/2017  . History of gestational diabetes 03/10/2017  . Hyperglycemia 03/10/2017  . Chronic bilateral thoracic back pain 03/10/2017  . Chronic bilateral low back pain without sciatica 03/10/2017  . S/P total hysterectomy 03/10/2017  . AK (actinic keratosis) 03/10/2017  . Localized osteoporosis without current pathological fracture 03/10/2017  . Urinary tract infection 04/19/2015    Past Surgical History:  Procedure Laterality Date  . ABDOMINAL HYSTERECTOMY  1990  . BACK SURGERY     lumbar fussion  . [place]  . CHOLECYSTECTOMY  1986  . COLOSTOMY    . COLOSTOMY CLOSURE     abd diverticulitis with colostomy, reversal 2007- 2008  . LAPAROTOMY  02/13/2012   Procedure: EXPLORATORY LAPAROTOMY;  Surgeon: Rolm Bookbinder, MD;  Location: [facility] OR;  Service: General;  Laterality: N/A;  Exploratory Laparotomy with small bowel resection, lysis of adhension and primary ventral hernia repair  . OOPHORECTOMY     1981  . TONSILLECTOMY     1958    Her family history includes Heart failure in her mother.      Outpatient Encounter Medications as of 06/21/2018  Medication Sig  . alendronate (FOSAMAX) 70 MG tablet TAKE 1 TABLET (70 MG TOTAL) BY MOUTH EVERY 7 (SEVEN) DAYS. TAKE WITH A FULL GLASS OF WATER ON AN EMPTY STOMACH.  Marland Kitchen aspirin 81 MG chewable tablet Chew by mouth daily.  . Biotin 10000 MCG TBDP Take by mouth.  . fexofenadine-pseudoephedrine (ALLEGRA-D 24) 180-240 MG per 24 hr tablet Take 1 tablet by mouth daily as needed. Seasonal allergies   . FIBER ADULT GUMMIES PO Take by mouth.  Marland Kitchen ibuprofen (ADVIL,MOTRIN) 200 MG tablet Take 200 mg by mouth every 6 (six) hours as needed. For pain  . metoprolol tartrate (LOPRESSOR) 25 MG tablet Take 1.5 tablets (37.5 mg total) by mouth 2 (two) times daily. (Patient taking differently: Take 50 mg by mouth 2 (two) times daily. )  . pregabalin (LYRICA) 75 MG capsule TAKE 1 CAPSULE BY MOUTH TWICE DAILY  . Vitamin D, Ergocalciferol, 2000 units CAPS Take by mouth.  . [DISCONTINUED] LYRICA 50 MG capsule TAKE ONE CAPSULE BY MOUTH 2 TIMES A DAY  . [DISCONTINUED] metoprolol tartrate (LOPRESSOR) 25 MG tablet TAKE 1 TABLET BY MOUTH 3 TIMES DAILY   No facility-administered encounter medications on file as of 06/21/2018.     Patient Care Team: Jerrol Banana., MD as PCP - General (Unknown Physician Specialty)      Objective:   Vitals:  Vitals:   06/21/18 0949  BP: (!) 183/93  Pulse: (!) 58  Resp: 16  Temp: 98.5 F (36.9 C)  TempSrc: Oral  Weight: 187 lb (84.8 kg)    Physical Exam  Constitutional: She is oriented to person, place, and time. She appears well-developed and well-nourished.  HENT:  Head: Normocephalic and atraumatic.  Right Ear: External ear normal.  Left Ear: External ear normal.  Nose: Nose normal.  Mouth/Throat: Oropharynx is clear and moist.  Eyes: Pupils are equal, round, and reactive to light. Conjunctivae and EOM are normal.  Neck: Normal range of motion. Neck supple.  Cardiovascular: Normal rate, regular rhythm, normal  heart sounds and intact distal pulses.  Pulmonary/Chest: Effort normal and breath sounds normal.  Abdominal: Soft. Bowel sounds are normal.  Musculoskeletal: Normal range of motion.  Neurological: She is alert and oriented to person, place, and time.  Skin: Skin is warm and dry.  Psychiatric: She has a normal mood and affect. Her behavior is normal. Judgment and thought content normal.     Depression Screen PHQ 2/9 Scores 06/21/2018 03/10/2017 03/10/2017  PHQ - 2 Score 0 0 0  PHQ- 9 Score - 0 -      Assessment & Plan:     Routine Health Maintenance and Physical Exam  Exercise Activities and Dietary recommendations Goals   None  Immunization History  Administered Date(s) Administered  . Influenza Split 09/11/2011  . Pneumococcal Conjugate-13 03/10/2017  . Tdap 03/10/2017    Health Maintenance  Topic Date Due  . FOOT EXAM  07/04/1961  . COLONOSCOPY  07/04/2001  . OPHTHALMOLOGY EXAM  01/26/2018  . URINE MICROALBUMIN  03/10/2018  . PNA vac Low Risk Adult (2 of 2 - PPSV23) 03/10/2018  . INFLUENZA VACCINE  03/30/2018  . HEMOGLOBIN A1C  05/03/2018  . MAMMOGRAM  09/01/2019  . TETANUS/TDAP  03/11/2027  . DEXA SCAN  Completed  . Hepatitis C Screening  Completed     Discussed health benefits of physical activity, and encouraged her to engage in regular exercise appropriate for her age and condition.  1. Annual physical exam Colonoscopy recommended.Pt says she will schedule.BMD for osteoporosis. Sees Gyn-- Dr Laurence Ferrari. - CBC with Differential/Platelet - Comprehensive metabolic panel - Lipid Panel With LDL/HDL Ratio - TSH  2. Essential hypertension Systolics high at home.RTC 1-2 months. - amLODipine (NORVASC) 5 MG tablet; Take 1 tablet (5 mg total) by mouth daily.  Dispense: 90 tablet; Refill: 3  3. Arthritis   4. Class 2 severe obesity due to excess calories with serious comorbidity and body mass index (BMI) of 35.0 to 35.9 in adult [facility]) With HTN and OA.     HPI, Exam and A&P Transcribed under the direction and in the presence of Miguel Aschoff, Brooke Bonito., MD. Electronically Signed: Althea Charon, RMA I have done the exam and reviewed the chart and it is accurate to the best of my knowledge. Development worker, community has been used and  any errors in dictation or transcription are unintentional. Miguel Aschoff M.D. [facility]

## 2018-06-22 LAB — CBC WITH DIFFERENTIAL/PLATELET
Basophils Absolute: 0.1 10*3/uL (ref 0.0–0.2)
Basos: 1 %
EOS (ABSOLUTE): 0.4 10*3/uL (ref 0.0–0.4)
Eos: 6 %
Hematocrit: 40.4 % (ref 34.0–46.6)
Hemoglobin: 13.6 g/dL (ref 11.1–15.9)
Immature Grans (Abs): 0 10*3/uL (ref 0.0–0.1)
Immature Granulocytes: 0 %
Lymphocytes Absolute: 2.1 10*3/uL (ref 0.7–3.1)
Lymphs: 34 %
MCH: 30.8 pg (ref 26.6–33.0)
MCHC: 33.7 g/dL (ref 31.5–35.7)
MCV: 91 fL (ref 79–97)
Monocytes Absolute: 0.6 10*3/uL (ref 0.1–0.9)
Monocytes: 10 %
Neutrophils Absolute: 3 10*3/uL (ref 1.4–7.0)
Neutrophils: 49 %
Platelets: 295 10*3/uL (ref 150–450)
RBC: 4.42 x10E6/uL (ref 3.77–5.28)
RDW: 13 % (ref 12.3–15.4)
WBC: 6.2 10*3/uL (ref 3.4–10.8)

## 2018-06-22 LAB — COMPREHENSIVE METABOLIC PANEL
ALT: 16 IU/L (ref 0–32)
AST: 21 IU/L (ref 0–40)
Albumin/Globulin Ratio: 1.7 (ref 1.2–2.2)
Albumin: 4.3 g/dL (ref 3.6–4.8)
Alkaline Phosphatase: 69 IU/L (ref 39–117)
BUN/Creatinine Ratio: 22 (ref 12–28)
BUN: 22 mg/dL (ref 8–27)
Bilirubin Total: 0.3 mg/dL (ref 0.0–1.2)
CO2: 21 mmol/L (ref 20–29)
Calcium: 10.1 mg/dL (ref 8.7–10.3)
Chloride: 105 mmol/L (ref 96–106)
Creatinine, Ser: 1.02 mg/dL — ABNORMAL HIGH (ref 0.57–1.00)
GFR calc Af Amer: 66 mL/min/{1.73_m2} (ref >59)
GFR calc non Af Amer: 57 mL/min/{1.73_m2} — ABNORMAL LOW (ref >59)
Globulin, Total: 2.5 g/dL (ref 1.5–4.5)
Glucose: 101 mg/dL — ABNORMAL HIGH (ref 65–99)
Potassium: 5.1 mmol/L (ref 3.5–5.2)
Sodium: 143 mmol/L (ref 134–144)
Total Protein: 6.8 g/dL (ref 6.0–8.5)

## 2018-06-22 LAB — LIPID PANEL WITH LDL/HDL RATIO
Cholesterol, Total: 234 mg/dL — ABNORMAL HIGH (ref 100–199)
HDL: 57 mg/dL (ref >39)
LDL Calculated: 140 mg/dL — ABNORMAL HIGH (ref 0–99)
LDl/HDL Ratio: 2.5 ratio (ref 0.0–3.2)
Triglycerides: 186 mg/dL — ABNORMAL HIGH (ref 0–149)
VLDL Cholesterol Cal: 37 mg/dL (ref 5–40)

## 2018-06-22 LAB — TSH: TSH: 1.06 u[IU]/mL (ref 0.450–4.500)

## 2018-07-05 DIAGNOSIS — Z85828 Personal history of other malignant neoplasm of skin: Secondary | ICD-10-CM | POA: Diagnosis not present

## 2018-07-05 DIAGNOSIS — Z1283 Encounter for screening for malignant neoplasm of skin: Secondary | ICD-10-CM | POA: Diagnosis not present

## 2018-07-05 DIAGNOSIS — L304 Erythema intertrigo: Secondary | ICD-10-CM | POA: Diagnosis not present

## 2018-07-05 DIAGNOSIS — L821 Other seborrheic keratosis: Secondary | ICD-10-CM | POA: Diagnosis not present

## 2018-07-05 DIAGNOSIS — D1800 Hemangioma unspecified site: Secondary | ICD-10-CM | POA: Diagnosis not present

## 2018-07-05 DIAGNOSIS — L739 Follicular disorder, unspecified: Secondary | ICD-10-CM | POA: Diagnosis not present

## 2018-07-05 DIAGNOSIS — L814 Other melanin hyperpigmentation: Secondary | ICD-10-CM | POA: Diagnosis not present

## 2018-07-19 DIAGNOSIS — M79644 Pain in right finger(s): Secondary | ICD-10-CM | POA: Diagnosis not present

## 2018-07-19 DIAGNOSIS — M79645 Pain in left finger(s): Secondary | ICD-10-CM | POA: Diagnosis not present

## 2018-07-19 DIAGNOSIS — M180 Bilateral primary osteoarthritis of first carpometacarpal joints: Secondary | ICD-10-CM | POA: Diagnosis not present

## 2018-07-19 DIAGNOSIS — M25432 Effusion, left wrist: Secondary | ICD-10-CM | POA: Diagnosis not present

## 2018-08-01 ENCOUNTER — Ambulatory Visit: Payer: 59 | Admitting: Family Medicine

## 2018-08-01 ENCOUNTER — Encounter: Payer: Self-pay | Admitting: Family Medicine

## 2018-08-01 VITALS — BP 136/74 | HR 68 | Temp 99.1°F | Resp 16

## 2018-08-01 DIAGNOSIS — I10 Essential (primary) hypertension: Secondary | ICD-10-CM

## 2018-08-01 NOTE — Progress Notes (Signed)
Patient: Barbara Fletcher Female    DOB: [birth year]   67 y.o.   MRN: [id number] Visit Date: 08/01/2018  Today's Provider: [name], MD   Chief Complaint  Patient presents with  . Hypertension   Subjective:    HPI  Hypertension, follow-up:  BP Readings from Last 3 Encounters:  08/01/18 136/74  06/21/18 (!) 183/93  10/31/17 (!) 150/90    She was last seen for hypertension 3 months ago.  BP at that visit was 183/93. Management since that visit includes adding Amlodipine . She reports good compliance with treatment. She is not having side effects.  She is not exercising. She is adherent to low salt diet.   Outside blood pressures are checked daily, averaging in the 140s/80s She is experiencing none.  Patient denies fatigue, lower extremity edema and palpitations.    Weight trend: stable Wt Readings from Last 3 Encounters:  06/21/18 187 lb (84.8 kg)  05/03/17 196 lb (88.9 kg)  03/10/17 198 lb (89.8 kg)    Current diet: well balanced     Allergies  Allergen Reactions  . Levsin [Hyoscyamine Sulfate] Hives and Shortness Of Breath  . Lisinopril Swelling    Facial swelling, no breathing issues     Current Outpatient Medications:  .  alendronate (FOSAMAX) 70 MG tablet, TAKE 1 TABLET (70 MG TOTAL) BY MOUTH EVERY 7 (SEVEN) DAYS. TAKE WITH A FULL GLASS OF WATER ON AN EMPTY STOMACH., Disp: 4 tablet, Rfl: 12 .  amLODipine (NORVASC) 5 MG tablet, Take 1 tablet (5 mg total) by mouth daily., Disp: 90 tablet, Rfl: 3 .  aspirin 81 MG chewable tablet, Chew by mouth daily., Disp: , Rfl:  .  Biotin 10000 MCG TBDP, Take by mouth., Disp: , Rfl:  .  fexofenadine-pseudoephedrine (ALLEGRA-D 24) 180-240 MG per 24 hr tablet, Take 1 tablet by mouth daily as needed. Seasonal allergies , Disp: , Rfl:  .  FIBER ADULT GUMMIES PO, Take by mouth., Disp: , Rfl:  .  ibuprofen (ADVIL,MOTRIN) 200 MG tablet, Take 200 mg by mouth every 6 (six) hours as needed. For pain, Disp: , Rfl:    .  metoprolol tartrate (LOPRESSOR) 25 MG tablet, Take 1.5 tablets (37.5 mg total) by mouth 2 (two) times daily. (Patient taking differently: Take 50 mg by mouth 2 (two) times daily. ), Disp: 90 tablet, Rfl: 12 .  pregabalin (LYRICA) 75 MG capsule, TAKE 1 CAPSULE BY MOUTH TWICE DAILY, Disp: 60 capsule, Rfl: 5 .  Vitamin D, Ergocalciferol, 2000 units CAPS, Take by mouth., Disp: , Rfl:   Review of Systems  Constitutional: Negative.   Respiratory: Negative.   Cardiovascular: Negative.   Musculoskeletal: Negative.   Neurological: Negative.   Psychiatric/Behavioral: Negative.     Social History   Tobacco Use  . Smoking status: Never Smoker  . Smokeless tobacco: Never Used  Substance Use Topics  . Alcohol use: Yes    Alcohol/week: 1.0 standard drinks    Types: 1 Glasses of wine per week    Comment: occ   Objective:   BP 136/74   Pulse 68   Temp 99.1 F (37.3 C)   Resp 16   SpO2 96%  Vitals:   08/01/18 0811  BP: 136/74  Pulse: 68  Resp: 16  Temp: 99.1 F (37.3 C)  SpO2: 96%     Physical Exam Nourished well-developed white female in no acute distress     Assessment & Plan:     1.  Essential hypertension Patient left before I could finish examining her.  BP better.  Her husband works here at the office and she has been upset because he has been having some health problems.  Fussed again in office staff today and I asked her to speak with the office manager and not with staff.  She became upset with me for saying this to her and left before we finished the visit.     I have done the exam and reviewed the above chart and it is accurate to the best of my knowledge. Development worker, community has been used in this note in any air is in the dictation or transcription are unintentional.  [name], MD  [facility]

## 2018-08-29 ENCOUNTER — Telehealth: Payer: Self-pay

## 2018-08-29 DIAGNOSIS — I10 Essential (primary) hypertension: Secondary | ICD-10-CM

## 2018-08-29 MED ORDER — METOPROLOL TARTRATE 25 MG PO TABS: 50.0000 mg | ORAL_TABLET | Freq: Two times a day (BID) | ORAL | 1 refills | Status: DC

## 2018-08-29 NOTE — Telephone Encounter (Signed)
Pt's husband reports she is out of her metoprolol.  She now takes 50mg  BID.  I sent a refill to [facility].    Thanks,   -Mickel Baas

## 2018-08-31 MED ORDER — METOPROLOL TARTRATE 50 MG PO TABS: 50.0000 mg | ORAL_TABLET | Freq: Two times a day (BID) | ORAL | 12 refills | Status: DC

## 2018-08-31 NOTE — Telephone Encounter (Signed)
Ok to rf for 6 months

## 2018-09-06 DIAGNOSIS — Z981 Arthrodesis status: Secondary | ICD-10-CM | POA: Diagnosis not present

## 2018-09-06 DIAGNOSIS — Z1231 Encounter for screening mammogram for malignant neoplasm of breast: Secondary | ICD-10-CM | POA: Diagnosis not present

## 2018-09-06 DIAGNOSIS — R634 Abnormal weight loss: Secondary | ICD-10-CM | POA: Diagnosis not present

## 2018-09-06 DIAGNOSIS — M810 Age-related osteoporosis without current pathological fracture: Secondary | ICD-10-CM | POA: Diagnosis not present

## 2018-09-06 DIAGNOSIS — Z90710 Acquired absence of both cervix and uterus: Secondary | ICD-10-CM | POA: Diagnosis not present

## 2018-09-06 LAB — HM MAMMOGRAPHY

## 2018-09-19 ENCOUNTER — Telehealth: Payer: Self-pay | Admitting: Family Medicine

## 2018-09-19 NOTE — Telephone Encounter (Signed)
LMTCB for surgical center to call regarding the info they need from [place].  We only have an EKG that was done in 2018.  We have no ETT or doppler.  Does she need cardiac surgical clearance.

## 2018-09-19 NOTE — Telephone Encounter (Signed)
[place], [place] - [place] ext [phone] Fax [phone]  Needing pt's most recent: ekg  Stress test  Doppler  Please fax.  Thanks, [employer]

## 2018-09-19 NOTE — Telephone Encounter (Signed)
Terri, RN called back -  said she or office couldn't access the most recent EKG that was recently done on pt. Just needing this faxed so they could review it.  Thanks, [employer]

## 2018-09-20 NOTE — Telephone Encounter (Signed)
Faxed most recent [facility] to number listed below.

## 2018-09-26 DIAGNOSIS — M13132 Monoarthritis, not elsewhere classified, left wrist: Secondary | ICD-10-CM | POA: Diagnosis not present

## 2018-09-26 DIAGNOSIS — G8918 Other acute postprocedural pain: Secondary | ICD-10-CM | POA: Diagnosis not present

## 2018-09-26 DIAGNOSIS — M1812 Unilateral primary osteoarthritis of first carpometacarpal joint, left hand: Secondary | ICD-10-CM | POA: Diagnosis not present

## 2018-10-11 DIAGNOSIS — M180 Bilateral primary osteoarthritis of first carpometacarpal joints: Secondary | ICD-10-CM | POA: Diagnosis not present

## 2018-10-11 DIAGNOSIS — M79644 Pain in right finger(s): Secondary | ICD-10-CM | POA: Diagnosis not present

## 2018-10-11 DIAGNOSIS — M79645 Pain in left finger(s): Secondary | ICD-10-CM | POA: Diagnosis not present

## 2018-10-11 DIAGNOSIS — Z5189 Encounter for other specified aftercare: Secondary | ICD-10-CM | POA: Diagnosis not present

## 2018-10-16 ENCOUNTER — Other Ambulatory Visit: Payer: Self-pay

## 2018-10-16 DIAGNOSIS — I10 Essential (primary) hypertension: Secondary | ICD-10-CM

## 2018-10-16 MED ORDER — METOPROLOL TARTRATE 50 MG PO TABS: 50.0000 mg | ORAL_TABLET | Freq: Two times a day (BID) | ORAL | 12 refills | Status: DC

## 2018-10-25 DIAGNOSIS — Z5189 Encounter for other specified aftercare: Secondary | ICD-10-CM | POA: Diagnosis not present

## 2018-11-08 DIAGNOSIS — M180 Bilateral primary osteoarthritis of first carpometacarpal joints: Secondary | ICD-10-CM | POA: Diagnosis not present

## 2018-11-08 DIAGNOSIS — M79644 Pain in right finger(s): Secondary | ICD-10-CM | POA: Diagnosis not present

## 2018-11-08 DIAGNOSIS — Z5189 Encounter for other specified aftercare: Secondary | ICD-10-CM | POA: Diagnosis not present

## 2018-11-08 DIAGNOSIS — M79645 Pain in left finger(s): Secondary | ICD-10-CM | POA: Diagnosis not present

## 2018-11-08 DIAGNOSIS — M25642 Stiffness of left hand, not elsewhere classified: Secondary | ICD-10-CM | POA: Diagnosis not present

## 2018-11-22 DIAGNOSIS — M25642 Stiffness of left hand, not elsewhere classified: Secondary | ICD-10-CM | POA: Diagnosis not present

## 2018-12-06 DIAGNOSIS — M79644 Pain in right finger(s): Secondary | ICD-10-CM | POA: Diagnosis not present

## 2018-12-06 DIAGNOSIS — Z4789 Encounter for other orthopedic aftercare: Secondary | ICD-10-CM | POA: Diagnosis not present

## 2018-12-06 DIAGNOSIS — M180 Bilateral primary osteoarthritis of first carpometacarpal joints: Secondary | ICD-10-CM | POA: Diagnosis not present

## 2018-12-06 DIAGNOSIS — M25642 Stiffness of left hand, not elsewhere classified: Secondary | ICD-10-CM | POA: Diagnosis not present

## 2018-12-06 DIAGNOSIS — M79645 Pain in left finger(s): Secondary | ICD-10-CM | POA: Diagnosis not present

## 2018-12-13 DIAGNOSIS — M25642 Stiffness of left hand, not elsewhere classified: Secondary | ICD-10-CM | POA: Diagnosis not present

## 2019-01-03 DIAGNOSIS — D1800 Hemangioma unspecified site: Secondary | ICD-10-CM | POA: Diagnosis not present

## 2019-01-03 DIAGNOSIS — L820 Inflamed seborrheic keratosis: Secondary | ICD-10-CM | POA: Diagnosis not present

## 2019-01-03 DIAGNOSIS — I8393 Asymptomatic varicose veins of bilateral lower extremities: Secondary | ICD-10-CM | POA: Diagnosis not present

## 2019-01-03 DIAGNOSIS — Z1283 Encounter for screening for malignant neoplasm of skin: Secondary | ICD-10-CM | POA: Diagnosis not present

## 2019-01-03 DIAGNOSIS — L739 Follicular disorder, unspecified: Secondary | ICD-10-CM | POA: Diagnosis not present

## 2019-01-03 DIAGNOSIS — Z85828 Personal history of other malignant neoplasm of skin: Secondary | ICD-10-CM | POA: Diagnosis not present

## 2019-01-03 DIAGNOSIS — L814 Other melanin hyperpigmentation: Secondary | ICD-10-CM | POA: Diagnosis not present

## 2019-01-03 DIAGNOSIS — L821 Other seborrheic keratosis: Secondary | ICD-10-CM | POA: Diagnosis not present

## 2019-01-03 DIAGNOSIS — I781 Nevus, non-neoplastic: Secondary | ICD-10-CM | POA: Diagnosis not present

## 2019-02-07 ENCOUNTER — Other Ambulatory Visit: Payer: Self-pay | Admitting: Podiatry

## 2019-02-07 DIAGNOSIS — H5203 Hypermetropia, bilateral: Secondary | ICD-10-CM | POA: Diagnosis not present

## 2019-02-07 DIAGNOSIS — H524 Presbyopia: Secondary | ICD-10-CM | POA: Diagnosis not present

## 2019-02-07 NOTE — Telephone Encounter (Signed)
Patient needs follow up appt to receive any further refills

## 2019-03-12 ENCOUNTER — Other Ambulatory Visit: Payer: Self-pay | Admitting: Podiatry

## 2019-03-14 ENCOUNTER — Other Ambulatory Visit: Payer: Self-pay | Admitting: Podiatry

## 2019-03-14 NOTE — Telephone Encounter (Signed)
Please refill electronically 

## 2019-05-14 ENCOUNTER — Other Ambulatory Visit: Payer: Self-pay | Admitting: Family Medicine

## 2019-05-14 DIAGNOSIS — M816 Localized osteoporosis [Lequesne]: Secondary | ICD-10-CM

## 2019-06-13 ENCOUNTER — Other Ambulatory Visit: Payer: Self-pay | Admitting: Family Medicine

## 2019-06-13 DIAGNOSIS — I10 Essential (primary) hypertension: Secondary | ICD-10-CM

## 2019-09-19 DIAGNOSIS — Z1231 Encounter for screening mammogram for malignant neoplasm of breast: Secondary | ICD-10-CM | POA: Diagnosis not present

## 2019-09-19 LAB — HM MAMMOGRAPHY

## 2019-10-05 ENCOUNTER — Other Ambulatory Visit: Payer: Self-pay | Admitting: *Deleted

## 2019-10-05 DIAGNOSIS — E7849 Other hyperlipidemia: Secondary | ICD-10-CM

## 2019-10-05 DIAGNOSIS — I10 Essential (primary) hypertension: Secondary | ICD-10-CM

## 2019-10-09 DIAGNOSIS — E7849 Other hyperlipidemia: Secondary | ICD-10-CM | POA: Diagnosis not present

## 2019-10-09 DIAGNOSIS — I10 Essential (primary) hypertension: Secondary | ICD-10-CM | POA: Diagnosis not present

## 2019-10-10 LAB — CBC WITH DIFFERENTIAL/PLATELET
Basophils Absolute: 0.1 10*3/uL (ref 0.0–0.2)
Basos: 1 %
EOS (ABSOLUTE): 0.4 10*3/uL (ref 0.0–0.4)
Eos: 6 %
Hematocrit: 41.1 % (ref 34.0–46.6)
Hemoglobin: 13.9 g/dL (ref 11.1–15.9)
Immature Grans (Abs): 0 10*3/uL (ref 0.0–0.1)
Immature Granulocytes: 0 %
Lymphocytes Absolute: 2.7 10*3/uL (ref 0.7–3.1)
Lymphs: 41 %
MCH: 31 pg (ref 26.6–33.0)
MCHC: 33.8 g/dL (ref 31.5–35.7)
MCV: 92 fL (ref 79–97)
Monocytes Absolute: 0.6 10*3/uL (ref 0.1–0.9)
Monocytes: 9 %
Neutrophils Absolute: 2.9 10*3/uL (ref 1.4–7.0)
Neutrophils: 43 %
Platelets: 271 10*3/uL (ref 150–450)
RBC: 4.48 x10E6/uL (ref 3.77–5.28)
RDW: 12.6 % (ref 11.7–15.4)
WBC: 6.7 10*3/uL (ref 3.4–10.8)

## 2019-10-10 LAB — COMPREHENSIVE METABOLIC PANEL
ALT: 14 IU/L (ref 0–32)
AST: 19 IU/L (ref 0–40)
Albumin/Globulin Ratio: 1.5 (ref 1.2–2.2)
Albumin: 4 g/dL (ref 3.8–4.8)
Alkaline Phosphatase: 84 IU/L (ref 39–117)
BUN/Creatinine Ratio: 22 (ref 12–28)
BUN: 18 mg/dL (ref 8–27)
Bilirubin Total: 0.4 mg/dL (ref 0.0–1.2)
CO2: 22 mmol/L (ref 20–29)
Calcium: 9.7 mg/dL (ref 8.7–10.3)
Chloride: 106 mmol/L (ref 96–106)
Creatinine, Ser: 0.83 mg/dL (ref 0.57–1.00)
GFR calc Af Amer: 84 mL/min/{1.73_m2} (ref >59)
GFR calc non Af Amer: 73 mL/min/{1.73_m2} (ref >59)
Globulin, Total: 2.7 g/dL (ref 1.5–4.5)
Glucose: 98 mg/dL (ref 65–99)
Potassium: 4.9 mmol/L (ref 3.5–5.2)
Sodium: 143 mmol/L (ref 134–144)
Total Protein: 6.7 g/dL (ref 6.0–8.5)

## 2019-10-10 LAB — LIPID PANEL
Chol/HDL Ratio: 3.9 ratio (ref 0.0–4.4)
Cholesterol, Total: 213 mg/dL — ABNORMAL HIGH (ref 100–199)
HDL: 55 mg/dL (ref >39)
LDL Chol Calc (NIH): 127 mg/dL — ABNORMAL HIGH (ref 0–99)
Triglycerides: 175 mg/dL — ABNORMAL HIGH (ref 0–149)
VLDL Cholesterol Cal: 31 mg/dL (ref 5–40)

## 2019-10-10 LAB — TSH: TSH: 2.79 u[IU]/mL (ref 0.450–4.500)

## 2019-10-17 NOTE — Progress Notes (Signed)
Patient: Barbara Fletcher Female    DOB: 10-04-1950   69 y.o.   MRN: [id number] Visit Date: 10/22/2019  Today's Provider: [name], MD   Chief Complaint  Patient presents with  . Palpitations   Subjective:     HPI   Patient states she has had palpitations for several years, that usually occur during the niight. For the past 2 weeks patient states she has been having mild palpitations during the day.  No chest pain, no shortness of breath.  She has had palpitations about twice a day for about 2 weeks.  Seems to be an irregular rhythm at times and seems to be getting faster recently.  No syncope or presyncope. She also complains of discomfort in the left shoulder and the left temporal side of her head.  There is no jaw claudication and there is no pain in the shoulder arm or neck with exertion.  She has no exertional dyspnea. There has been a lot of family stress recently with the death of her father-in-law and with the nephew that showing signs of mental health issues but he is fairly belligerent with family. Allergies  Allergen Reactions  . Levsin [Hyoscyamine Sulfate] Hives and Shortness Of Breath  . Lisinopril Swelling    Facial swelling, no breathing issues     Current Outpatient Medications:  .  alendronate (FOSAMAX) 70 MG tablet, TAKE 1 TABLET BY MOUTH EVERY 7 DAYS. TAKE WITH A FULL GLASS OF WATER ON AN EMPTY STOMACH., Disp: 4 tablet, Rfl: 12 .  amLODipine (NORVASC) 5 MG tablet, TAKE 1 TABLET (5 MG TOTAL) BY MOUTH DAILY., Disp: 90 tablet, Rfl: 1 .  aspirin 81 MG chewable tablet, Chew by mouth daily., Disp: , Rfl:  .  Biotin 10000 MCG TBDP, Take by mouth., Disp: , Rfl:  .  fexofenadine-pseudoephedrine (ALLEGRA-D 24) 180-240 MG per 24 hr tablet, Take 1 tablet by mouth daily as needed. Seasonal allergies , Disp: , Rfl:  .  FIBER ADULT GUMMIES PO, Take by mouth., Disp: , Rfl:  .  ibuprofen (ADVIL,MOTRIN) 200 MG tablet, Take 200 mg by mouth every 6 (six) hours as  needed. For pain, Disp: , Rfl:  .  metoprolol tartrate (LOPRESSOR) 50 MG tablet, Take 1 tablet (50 mg total) by mouth 2 (two) times daily., Disp: 60 tablet, Rfl: 12 .  pregabalin (LYRICA) 75 MG capsule, TAKE 1 CAPSULE BY MOUTH TWO TIMES DAILY, Disp: 60 capsule, Rfl: 5 .  Vitamin D, Ergocalciferol, 2000 units CAPS, Take by mouth., Disp: , Rfl:   Review of Systems  Constitutional: Negative for appetite change, chills, fatigue and fever.  Eyes: Negative.   Respiratory: Negative for chest tightness and shortness of breath.   Cardiovascular: Positive for palpitations. Negative for chest pain.  Gastrointestinal: Negative for abdominal pain, nausea and vomiting.  Endocrine: Negative.   Musculoskeletal: Positive for back pain and neck pain.  Skin: Negative.   Allergic/Immunologic: Negative.   Neurological: Positive for headaches. Negative for dizziness and weakness.  Psychiatric/Behavioral: Negative.     Social History   Tobacco Use  . Smoking status: Never Smoker  . Smokeless tobacco: Never Used  Substance Use Topics  . Alcohol use: Yes    Alcohol/week: 1.0 standard drinks    Types: 1 Glasses of wine per week    Comment: occ      Objective:   BP 132/78 (BP Location: Right Arm, Patient Position: Sitting, Cuff Size: Large)   Pulse (!) 55  Temp (!) 96.9 F (36.1 C) (Other (Comment))   Resp 18   Ht '5\' 1"'$  (1.549 m)   Wt 188 lb (85.3 kg)   SpO2 96%   BMI 35.52 kg/m  Vitals:   10/22/19 0900  BP: 132/78  Pulse: (!) 55  Resp: 18  Temp: (!) 96.9 F (36.1 C)  TempSrc: Other (Comment)  SpO2: 96%  Weight: 188 lb (85.3 kg)  Height: '5\' 1"'$  (1.549 m)  Body mass index is 35.52 kg/m.   Physical Exam Vitals reviewed.  Constitutional:      Appearance: She is well-developed. She is obese.  HENT:     Head: Normocephalic and atraumatic.     Right Ear: External ear normal.     Left Ear: External ear normal.     Nose: Nose normal.  Eyes:     Conjunctiva/sclera: Conjunctivae  normal.     Pupils: Pupils are equal, round, and reactive to light.  Cardiovascular:     Rate and Rhythm: Normal rate and regular rhythm.     Heart sounds: Normal heart sounds.  Pulmonary:     Effort: Pulmonary effort is normal.     Breath sounds: Normal breath sounds.  Abdominal:     General: Bowel sounds are normal.     Palpations: Abdomen is soft.  Musculoskeletal:        General: Normal range of motion.     Cervical back: Normal range of motion and neck supple.  Skin:    General: Skin is warm and dry.  Neurological:     General: No focal deficit present.     Mental Status: She is alert and oriented to person, place, and time.  Psychiatric:        Mood and Affect: Mood normal.        Behavior: Behavior normal.        Thought Content: Thought content normal.        Judgment: Judgment normal.   ECG reveals sinus bradycardia only.   No results found for any visits on 10/22/19.     Assessment & Plan     1. Palpitations Refer to cardiology for evaluation.  No changes today in care for this. - EKG 12-Lead - Ambulatory referral to Cardiology  2. Frequent headaches Rule out temporal arteritis although I think it is probably from her neck.  Obtain sed rate and x-ray cervical spine. - Sed Rate (ESR) - DG Cervical Spine Complete  3. Essential hypertension Controlled.  4. Localized osteoporosis without current pathological fracture   5. Arthritis   6. Other hyperlipidemia Follow-up lipids.  7. Chronic bilateral low back pain without sciatica   8. Cervical radiculopathy Known degenerative disc disease.  Obtain x-ray, may need further evaluation.  9. Nonintractable episodic headache, unspecified headache type   10. Class 2 severe obesity due to excess calories with serious comorbidity and body mass index (BMI) of 35.0 to 35.9 in adult [facility]) Diet and exercise changes to help with some weight loss would benefit patient's overall health.     I,Damoni Causby,acting as a scribe for [name], MD.,have documented all relevant documentation on the behalf of [name], MD,as directed by  [name], MD while in the presence of [name], MD.     [name], MD  [facility]

## 2019-10-18 ENCOUNTER — Ambulatory Visit: Payer: Self-pay | Admitting: Family Medicine

## 2019-10-22 ENCOUNTER — Ambulatory Visit (INDEPENDENT_AMBULATORY_CARE_PROVIDER_SITE_OTHER): Payer: 59 | Admitting: Family Medicine

## 2019-10-22 ENCOUNTER — Encounter: Payer: Self-pay | Admitting: Family Medicine

## 2019-10-22 ENCOUNTER — Ambulatory Visit
Admission: RE | Admit: 2019-10-22 | Discharge: 2019-10-22 | Disposition: A | Discharge status: Home / Self Care | Payer: 59 | Source: Ambulatory Visit | Attending: Family Medicine | Admitting: Family Medicine

## 2019-10-22 ENCOUNTER — Other Ambulatory Visit: Payer: Self-pay

## 2019-10-22 VITALS — BP 132/78 | HR 55 | Temp 96.9°F | Resp 18 | Ht 61.0 in | Wt 188.0 lb

## 2019-10-22 DIAGNOSIS — R519 Headache, unspecified: Secondary | ICD-10-CM | POA: Diagnosis not present

## 2019-10-22 DIAGNOSIS — M542 Cervicalgia: Secondary | ICD-10-CM | POA: Diagnosis not present

## 2019-10-22 DIAGNOSIS — Z6835 Body mass index (BMI) 35.0-35.9, adult: Secondary | ICD-10-CM

## 2019-10-22 DIAGNOSIS — M816 Localized osteoporosis [Lequesne]: Secondary | ICD-10-CM | POA: Diagnosis not present

## 2019-10-22 DIAGNOSIS — E7849 Other hyperlipidemia: Secondary | ICD-10-CM

## 2019-10-22 DIAGNOSIS — M545 Low back pain: Secondary | ICD-10-CM | POA: Diagnosis not present

## 2019-10-22 DIAGNOSIS — M1990 Unspecified osteoarthritis, unspecified site: Secondary | ICD-10-CM | POA: Diagnosis not present

## 2019-10-22 DIAGNOSIS — G8929 Other chronic pain: Secondary | ICD-10-CM

## 2019-10-22 DIAGNOSIS — M5412 Radiculopathy, cervical region: Secondary | ICD-10-CM | POA: Diagnosis not present

## 2019-10-22 DIAGNOSIS — R002 Palpitations: Secondary | ICD-10-CM | POA: Diagnosis not present

## 2019-10-22 DIAGNOSIS — I10 Essential (primary) hypertension: Secondary | ICD-10-CM | POA: Diagnosis not present

## 2019-10-23 LAB — SEDIMENTATION RATE: Sed Rate: 8 mm/hr (ref 0–40)

## 2019-10-24 ENCOUNTER — Telehealth: Payer: Self-pay

## 2019-10-24 NOTE — Telephone Encounter (Signed)
Patient's husband Simona Huh advised of patients results.

## 2019-10-24 NOTE — Telephone Encounter (Signed)
-----   Message from Jerrol Banana., MD sent at 10/24/2019  8:54 AM EST ----- Significant degenerative disc disease.

## 2019-10-25 ENCOUNTER — Other Ambulatory Visit: Payer: Self-pay | Admitting: Podiatry

## 2019-10-25 MED ORDER — PREGABALIN 75 MG PO CAPS: ORAL_CAPSULE | ORAL | 5 refills | Status: DC

## 2019-11-13 ENCOUNTER — Other Ambulatory Visit: Payer: Self-pay | Admitting: Family Medicine

## 2019-11-13 DIAGNOSIS — I10 Essential (primary) hypertension: Secondary | ICD-10-CM

## 2019-11-13 NOTE — Telephone Encounter (Signed)
Requested Prescriptions  Pending Prescriptions Disp Refills  . metoprolol tartrate (LOPRESSOR) 50 MG tablet [Pharmacy Med Name: METOPROLOL TARTRATE 50 MG T 50 Tablet] 180 tablet 1    Sig: TAKE 1 TABLET BY MOUTH TWICE DAILY     Cardiovascular:  Beta Blockers Passed - 11/13/2019  8:53 AM      Passed - Last BP in normal range    BP Readings from Last 1 Encounters:  10/22/19 132/78         Passed - Last Heart Rate in normal range    Pulse Readings from Last 1 Encounters:  10/22/19 (!) 55         Passed - Valid encounter within last 6 months    Recent Outpatient Visits          3 weeks ago [place] Jerrol Banana., MD   1 year ago Essential hypertension   [facility] Jerrol Banana., MD   1 year ago Annual physical exam   [facility] Jerrol Banana., MD   2 years ago Essential hypertension   [facility] Jerrol Banana., MD   2 years ago Essential hypertension   [facility] Jerrol Banana., MD      Future Appointments            In 1 week [place], Charlann Lange, MD [facility]

## 2019-11-20 ENCOUNTER — Ambulatory Visit: Payer: 59 | Admitting: Interventional Cardiology

## 2019-12-12 ENCOUNTER — Other Ambulatory Visit: Payer: Self-pay | Admitting: Family Medicine

## 2019-12-12 ENCOUNTER — Telehealth: Payer: Self-pay | Admitting: Interventional Cardiology

## 2019-12-12 DIAGNOSIS — I10 Essential (primary) hypertension: Secondary | ICD-10-CM

## 2019-12-12 NOTE — Telephone Encounter (Signed)
Patient wanted to know if her Husband, who is a PA , would be able to come back to the appointment  Tomorrow with her. He understands the medical jargon better. Please let the patient know what the office decides

## 2019-12-13 ENCOUNTER — Other Ambulatory Visit: Payer: Self-pay

## 2019-12-13 ENCOUNTER — Ambulatory Visit: Payer: 59 | Admitting: Interventional Cardiology

## 2019-12-13 ENCOUNTER — Encounter: Payer: Self-pay | Admitting: Interventional Cardiology

## 2019-12-13 ENCOUNTER — Encounter: Payer: Self-pay | Admitting: *Deleted

## 2019-12-13 VITALS — BP 120/72 | HR 59 | Ht 61.0 in | Wt 189.8 lb

## 2019-12-13 DIAGNOSIS — I10 Essential (primary) hypertension: Secondary | ICD-10-CM

## 2019-12-13 DIAGNOSIS — R002 Palpitations: Secondary | ICD-10-CM

## 2019-12-13 DIAGNOSIS — E782 Mixed hyperlipidemia: Secondary | ICD-10-CM | POA: Diagnosis not present

## 2019-12-13 NOTE — Telephone Encounter (Signed)
Called and spoke to patient. She is asking if her husband can come up with her to her appt. She states that he is a PA and can help remember everything. Her husband is also a patient of Dr. Hassell Done. She does not have any cognitive or physical impairment. Per Dr. Irish Lack, the patient's husband may come up to the visit with her.

## 2019-12-13 NOTE — Progress Notes (Signed)
Patient ID: Barbara Fletcher, female   DOB: [birth year], 69 y.o.   MRN: [id number] Patient enrolled for [employer] to ship a 14 day ZIO XT long term holter monitor to her home.

## 2019-12-13 NOTE — Patient Instructions (Signed)
Medication Instructions:  Your physician recommends that you continue on your current medications as directed. Please refer to the Current Medication list given to you today.  *If you need a refill on your cardiac medications before your next appointment, please call your pharmacy*   Lab Work: None ordered  If you have labs (blood work) drawn today and your tests are completely normal, you will receive your results only by: . MyChart Message (if you have MyChart) OR . A paper copy in the mail If you have any lab test that is abnormal or we need to change your treatment, we will call you to review the results.   Testing/Procedures: Your physician has recommended that you wear a 14 day monitor. These monitors are medical devices that record the heart's electrical activity. Doctors most often use these monitors to diagnose arrhythmias. Arrhythmias are problems with the speed or rhythm of the heartbeat. The monitor is a small, portable device. You can wear one while you do your normal daily activities. This is usually used to diagnose what is causing palpitations/syncope (passing out).   Follow-Up: Based on monitor results  Other Instructions ZIO XT- Long Term Monitor Instructions   Your physician has requested you wear your ZIO patch monitor 14 days.   This is a single patch monitor.  [employer] supplies one patch monitor per enrollment.  Additional stickers are not available.   Please do not apply patch if you will be having a Nuclear Stress Test, Echocardiogram, Cardiac CT, MRI, or Chest Xray during the time frame you would be wearing the monitor. The patch cannot be worn during these tests.  You cannot remove and re-apply the ZIO XT patch monitor.   Your ZIO patch monitor will be sent USPS Priority mail from [employer] directly to your home address. The monitor may also be mailed to a PO BOX if home delivery is not available.   It may take 3-5 days to receive your monitor after  you have been enrolled.   Once you have received you monitor, please review enclosed instructions.  Your monitor has already been registered assigning a specific monitor serial # to you.   Applying the monitor   Shave hair from upper left chest.   Hold abrader disc by orange tab.  Rub abrader in 40 strokes over left upper chest as indicated in your monitor instructions.   Clean area with 4 enclosed alcohol pads .  Use all pads to assure are is cleaned thoroughly.  Let dry.   Apply patch as indicated in monitor instructions.  Patch will be place under collarbone on left side of chest with arrow pointing upward.   Rub patch adhesive wings for 2 minutes.Remove white label marked "1".  Remove white label marked "2".  Rub patch adhesive wings for 2 additional minutes.   While looking in a mirror, press and release button in center of patch.  A small green light will flash 3-4 times .  This will be your only indicator the monitor has been turned on.     Do not shower for the first 24 hours.  You may shower after the first 24 hours.   Press button if you feel a symptom. You will hear a small click.  Record Date, Time and Symptom in the Patient Log Book.   When you are ready to remove patch, follow instructions on last 2 pages of Patient Log Book.  Stick patch monitor onto last page of Patient Log Book.     Place Patient Log Book in Blue box.  Use locking tab on box and tape box closed securely.  The Orange and White box has prepaid postage on it.  Please place in mailbox as soon as possible.  Your physician should have your test results approximately 7 days after the monitor has been mailed back to [employer].   Call [employer] Customer Care at [phone] if you have questions regarding your ZIO XT patch monitor.  Call them immediately if you see an orange light blinking on your monitor.   If your monitor falls off in less than 4 days contact our Monitor department at [phone].  If  your monitor becomes loose or falls off after 4 days call [employer] at [phone] for suggestions on securing your monitor.    

## 2019-12-13 NOTE — Progress Notes (Signed)
Cardiology Office Note   Date:  12/13/2019   ID:  Barbara Fletcher, DOB August 17, 1951, MRN [id number]  PCP:  Barbara Banana., MD    No chief complaint on file.  palpitations  Wt Readings from Last 3 Encounters:  12/13/19 189 lb 12.8 oz (86.1 kg)  10/22/19 188 lb (85.3 kg)  06/21/18 187 lb (84.8 kg)       History of Present Illness: Barbara Fletcher is a 69 y.o. female who is being seen today for the evaluation of HTN, palpitations at the request of Barbara Banana.,*.  She has woken up in the middle of the night with palpitations, pulse is irregular, up to the 120s.  BP has been high at times to the 180s as well.  First episode was a few months ago.  Episodes are getting more frequent, no once a week.   BP has been requiring more meds over the past few years.    Walks over 10K steps daily at work.  She can feels some palpitations at work, but worse at night.   Some DOE.  SHe has had some headaches as well.   Past Medical History:  Diagnosis Date  . Arthritis   . Cervical radiculopathy   . Chronic bilateral low back pain without sciatica   . Class 2 severe obesity due to excess calories with serious comorbidity and body mass index (BMI) of 35.0 to 35.9 in adult [facility])   . Diverticulitis   . Frequent headaches   . Headaches, cluster   . Hyperlipidemia   . Hypertension   . Osteoporosis without current pathological fracture   . Palpitations     Past Surgical History:  Procedure Laterality Date  . ABDOMINAL HYSTERECTOMY  1990  . BACK SURGERY     lumbar fussion  . [place]  . CHOLECYSTECTOMY  1986  . COLOSTOMY    . COLOSTOMY CLOSURE     abd diverticulitis with colostomy, reversal 2007- 2008  . LAPAROTOMY  02/13/2012   Procedure: EXPLORATORY LAPAROTOMY;  Surgeon: Rolm Bookbinder, MD;  Location: [facility] OR;  Service: General;  Laterality: N/A;  Exploratory Laparotomy with small bowel resection, lysis of adhension and primary ventral  hernia repair  . OOPHORECTOMY     1981  . TONSILLECTOMY     1958     Current Outpatient Medications  Medication Sig Dispense Refill  . alendronate (FOSAMAX) 70 MG tablet TAKE 1 TABLET BY MOUTH EVERY 7 DAYS. TAKE WITH A FULL GLASS OF WATER ON AN EMPTY STOMACH. 4 tablet 12  . amLODipine (NORVASC) 5 MG tablet TAKE 1 TABLET (5 MG TOTAL) BY MOUTH DAILY. 90 tablet 0  . aspirin 81 MG chewable tablet Chew by mouth daily.    . Biotin 10000 MCG TBDP Take by mouth.    . fexofenadine-pseudoephedrine (ALLEGRA-D 24) 180-240 MG per 24 hr tablet Take 1 tablet by mouth daily as needed. Seasonal allergies     . FIBER ADULT GUMMIES PO Take by mouth.    Marland Kitchen ibuprofen (ADVIL,MOTRIN) 200 MG tablet Take 200 mg by mouth every 6 (six) hours as needed. For pain    . metoprolol tartrate (LOPRESSOR) 50 MG tablet TAKE 1 TABLET BY MOUTH TWICE DAILY 180 tablet 1  . niacin 500 MG CR capsule Take 500 mg by mouth at bedtime.    . Omega-3 Fatty Acids (FISH OIL) 1200 MG CAPS Take by mouth in the morning and at bedtime.    Marland Kitchen  pregabalin (LYRICA) 75 MG capsule TAKE 1 CAPSULE BY MOUTH TWO TIMES DAILY (Patient taking differently: Take 75 mg by mouth daily. TAKE 1 CAPSULE BY MOUTH TWO TIMES DAILY) 60 capsule 5  . Vitamin D, Ergocalciferol, 2000 units CAPS Take by mouth.     No current facility-administered medications for this visit.    Allergies:   Levsin [hyoscyamine sulfate], Levsin [hyoscyamine], and Lisinopril    Social History:  The patient  reports that she has never smoked. She has never used smokeless tobacco. She reports current alcohol use of about 1.0 standard drinks of alcohol per week. She reports that she does not use drugs.   Family History:  The patient's family history includes Heart failure in her mother.    ROS:  Please see the history of present illness.   Otherwise, review of systems are positive for palpitations.   All other systems are reviewed and negative.    PHYSICAL EXAM: VS:  BP 120/72    Pulse (!) 59   Ht 5\' 1"  (1.549 m)   Wt 189 lb 12.8 oz (86.1 kg)   SpO2 99%   BMI 35.86 kg/m  , BMI Body mass index is 35.86 kg/m. GEN: Well nourished, well developed, in no acute distress  HEENT: normal  Neck: no JVD, carotid bruits, or masses Cardiac: RRR; no murmurs, rubs, or gallops,no edema  Respiratory:  clear to auscultation bilaterally, normal work of breathing GI: soft, nontender, nondistended, + BS MS: no deformity or atrophy  Skin: warm and dry, no rash Neuro:  Strength and sensation are intact Psych: euthymic mood, full affect   EKG:   The ekg ordered in 2/21 demonstrates NSR, no ST changes   Recent Labs: 10/09/2019: ALT 14; BUN 18; Creatinine, Ser 0.83; Hemoglobin 13.9; Platelets 271; Potassium 4.9; Sodium 143; TSH 2.790   Lipid Panel    Component Value Date/Time   CHOL 213 (H) 10/09/2019 0834   TRIG 175 (H) 10/09/2019 0834   HDL 55 10/09/2019 0834   CHOLHDL 3.9 10/09/2019 0834   LDLCALC 127 (H) 10/09/2019 0834     Other studies Reviewed: Additional studies/ records that were reviewed today with results demonstrating: PMD labs reviewed.   ASSESSMENT AND PLAN:  1. Palpitations: Some features concerning for AFib.  Continue metoprolol.  Plan for Zio patch.  2. HTN: The current medical regimen is effective;  continue present plan and medications. 3. Hyperlipidemia: Continue niacin and fish oil.  Healthy diet as well. 4. Mild DOE: may be from deconditioning.  Will consider echo depending on monitor findings.   Current medicines are reviewed at length with the patient today.  The patient concerns regarding her medicines were addressed.  The following changes have been made:  No change  Labs/ tests ordered today include: Zio patch No orders of the defined types were placed in this encounter.   Recommend 150 minutes/week of aerobic exercise Low fat, low carb, high fiber diet recommended  Disposition:   FU based on monitor results   Signed, Larae Grooms, MD  12/13/2019 11:06 AM    [facility] [place], [place], [place]  [address] Phone: [phone]; Fax: [phone]

## 2019-12-18 ENCOUNTER — Other Ambulatory Visit (INDEPENDENT_AMBULATORY_CARE_PROVIDER_SITE_OTHER): Payer: 59

## 2019-12-18 DIAGNOSIS — R002 Palpitations: Secondary | ICD-10-CM | POA: Diagnosis not present

## 2020-02-13 DIAGNOSIS — H524 Presbyopia: Secondary | ICD-10-CM | POA: Diagnosis not present

## 2020-02-13 DIAGNOSIS — H5203 Hypermetropia, bilateral: Secondary | ICD-10-CM | POA: Diagnosis not present

## 2020-03-12 ENCOUNTER — Other Ambulatory Visit: Payer: Self-pay | Admitting: Family Medicine

## 2020-03-12 DIAGNOSIS — I10 Essential (primary) hypertension: Secondary | ICD-10-CM

## 2020-04-24 ENCOUNTER — Other Ambulatory Visit: Payer: Self-pay | Admitting: Podiatry

## 2020-05-06 ENCOUNTER — Other Ambulatory Visit: Payer: Self-pay | Admitting: Family Medicine

## 2020-05-06 DIAGNOSIS — I10 Essential (primary) hypertension: Secondary | ICD-10-CM

## 2020-05-06 NOTE — Telephone Encounter (Signed)
Requested medication (s) are due for refill today: yes   Requested medication (s) are on the active medication list: yes   Last refill:  02/12/2020  Future visit scheduled: no  Notes to clinic:  overdue for follow up appointment    Requested Prescriptions  Pending Prescriptions Disp Refills   metoprolol tartrate (LOPRESSOR) 50 MG tablet [Pharmacy Med Name: METOPROLOL TARTRATE 50 MG T 50 Tablet] 180 tablet 1    Sig: TAKE 1 TABLET BY MOUTH TWICE DAILY      Cardiovascular:  Beta Blockers Failed - 05/06/2020  9:30 AM      Failed - Valid encounter within last 6 months    Recent Outpatient Visits           6 months ago [place] Jerrol Banana., MD   1 year ago Essential hypertension   [facility] Jerrol Banana., MD   1 year ago Annual physical exam   [facility] Jerrol Banana., MD   2 years ago Essential hypertension   [facility] Jerrol Banana., MD   3 years ago Essential hypertension   [facility] Jerrol Banana., MD              Passed - Last BP in normal range    BP Readings from Last 1 Encounters:  12/13/19 120/72          Passed - Last Heart Rate in normal range    Pulse Readings from Last 1 Encounters:  12/13/19 (!) 59

## 2020-05-07 ENCOUNTER — Other Ambulatory Visit: Payer: Self-pay | Admitting: Family Medicine

## 2020-06-16 ENCOUNTER — Other Ambulatory Visit: Payer: Self-pay | Admitting: Family Medicine

## 2020-06-16 DIAGNOSIS — I10 Essential (primary) hypertension: Secondary | ICD-10-CM

## 2020-07-03 ENCOUNTER — Ambulatory Visit: Payer: 59 | Admitting: Family Medicine

## 2020-07-09 ENCOUNTER — Ambulatory Visit
Admission: RE | Admit: 2020-07-09 | Discharge: 2020-07-09 | Disposition: A | Discharge status: Home / Self Care | Payer: 59 | Attending: Family Medicine | Admitting: Family Medicine

## 2020-07-09 ENCOUNTER — Other Ambulatory Visit: Payer: Self-pay

## 2020-07-09 ENCOUNTER — Ambulatory Visit (INDEPENDENT_AMBULATORY_CARE_PROVIDER_SITE_OTHER): Payer: 59 | Admitting: Family Medicine

## 2020-07-09 ENCOUNTER — Encounter: Payer: Self-pay | Admitting: Family Medicine

## 2020-07-09 ENCOUNTER — Ambulatory Visit
Admission: RE | Admit: 2020-07-09 | Discharge: 2020-07-09 | Disposition: A | Discharge status: Home / Self Care | Payer: 59 | Source: Ambulatory Visit | Attending: Family Medicine | Admitting: Family Medicine

## 2020-07-09 VITALS — BP 141/63 | HR 52 | Temp 98.3°F | Wt 193.0 lb

## 2020-07-09 DIAGNOSIS — E782 Mixed hyperlipidemia: Secondary | ICD-10-CM | POA: Diagnosis not present

## 2020-07-09 DIAGNOSIS — R002 Palpitations: Secondary | ICD-10-CM | POA: Diagnosis not present

## 2020-07-09 DIAGNOSIS — M816 Localized osteoporosis [Lequesne]: Secondary | ICD-10-CM

## 2020-07-09 DIAGNOSIS — G8929 Other chronic pain: Secondary | ICD-10-CM

## 2020-07-09 DIAGNOSIS — M25561 Pain in right knee: Secondary | ICD-10-CM | POA: Insufficient documentation

## 2020-07-09 DIAGNOSIS — M1711 Unilateral primary osteoarthritis, right knee: Secondary | ICD-10-CM | POA: Diagnosis not present

## 2020-07-09 DIAGNOSIS — Z23 Encounter for immunization: Secondary | ICD-10-CM | POA: Diagnosis not present

## 2020-07-09 DIAGNOSIS — M25562 Pain in left knee: Secondary | ICD-10-CM

## 2020-07-09 DIAGNOSIS — M545 Low back pain, unspecified: Secondary | ICD-10-CM | POA: Diagnosis not present

## 2020-07-09 DIAGNOSIS — M25462 Effusion, left knee: Secondary | ICD-10-CM | POA: Diagnosis not present

## 2020-07-09 DIAGNOSIS — I10 Essential (primary) hypertension: Secondary | ICD-10-CM

## 2020-07-09 DIAGNOSIS — Z90710 Acquired absence of both cervix and uterus: Secondary | ICD-10-CM

## 2020-07-09 DIAGNOSIS — M1712 Unilateral primary osteoarthritis, left knee: Secondary | ICD-10-CM | POA: Diagnosis not present

## 2020-07-09 DIAGNOSIS — Z6835 Body mass index (BMI) 35.0-35.9, adult: Secondary | ICD-10-CM

## 2020-07-09 NOTE — Patient Instructions (Signed)
Try Tumeric daily and then Glucosamine daily.

## 2020-07-09 NOTE — Progress Notes (Signed)
Established patient visit   Patient: Barbara Fletcher   DOB: [birth year]   69 y.o. Female  MRN: [id number] Visit Date: 07/09/2020  Today's healthcare provider: [name], MD   Chief Complaint  Patient presents with  . Hypertension   Subjective    HPI  Patient has chronic pain, especially chronic bilateral knee pain with weightbearing. Hypertension, follow-up  BP Readings from Last 3 Encounters:  07/09/20 (!) 141/63  12/13/19 120/72  10/22/19 132/78   Wt Readings from Last 3 Encounters:  07/09/20 193 lb (87.5 kg)  12/13/19 189 lb 12.8 oz (86.1 kg)  10/22/19 188 lb (85.3 kg)     She was last seen for hypertension 9 months ago.  BP at that visit was 120/72. Management since that visit includes; Controlled. She reports excellent compliance with treatment. She is having side effects. Ankles swell She is not exercising. She is adherent to low salt diet.   Outside blood pressures are only checked occasionally.  She does not smoke.  Use of agents associated with hypertension: none.   --------------------------------------------------------------------------------------------------- Lipid/Cholesterol, follow-up  Last Lipid Panel: Lab Results  Component Value Date   CHOL 213 (H) 10/09/2019   LDLCALC 127 (H) 10/09/2019   HDL 55 10/09/2019   TRIG 175 (H) 10/09/2019    She was last seen for this 9 months ago.  Management since that visit includes; Follow-up lipids. She reports excellent compliance with treatment. She is not having side effects.   Last metabolic panel Lab Results  Component Value Date   GLUCOSE 98 10/09/2019   NA 143 10/09/2019   K 4.9 10/09/2019   BUN 18 10/09/2019   CREATININE 0.83 10/09/2019   GFRNONAA 73 10/09/2019   GFRAA 84 10/09/2019   CALCIUM 9.7 10/09/2019   AST 19 10/09/2019   ALT 14 10/09/2019   The 10-year ASCVD risk score Mikey Bussing DC Jr., et al., 2013) is:  25.2%  ---------------------------------------------------------------------------------------------------  Frequent headaches From 10/22/2019-Rule out temporal arteritis although I think it is probably from her neck.  Obtain sed rate and x-ray cervical spine. Sed rate that was drawn for headache is normal. No temporal arteritis  Cervical radiculopathy From 10/22/2019-Known degenerative disc disease.  Obtain x-ray, may need further evaluation. X-ray showed-Significant degenerative disc disease.   Class 2 severe obesity due to excess calories with serious comorbidity and body mass index (BMI) of 35.0 to 35.9 in adult [facility]) From 10/22/2019-Diet and exercise changes to help with some weight loss would benefit patient's overall health.       Medications: Outpatient Medications Prior to Visit  Medication Sig  . alendronate (FOSAMAX) 70 MG tablet TAKE 1 TABLET BY MOUTH EVERY 7 DAYS. TAKE WITH A FULL GLASS OF WATER ON AN EMPTY STOMACH.  Marland Kitchen amLODipine (NORVASC) 5 MG tablet TAKE 1 TABLET BY MOUTH DAILY.  Marland Kitchen ascorbic acid (VITAMIN C) 500 MG tablet Take 500 mg by mouth daily.  Marland Kitchen aspirin 81 MG chewable tablet Chew by mouth daily.  . Biotin 10000 MCG TBDP Take by mouth.  . fexofenadine-pseudoephedrine (ALLEGRA-D 24) 180-240 MG per 24 hr tablet Take 1 tablet by mouth daily as needed. Seasonal allergies   . FIBER ADULT GUMMIES PO Take by mouth.  Marland Kitchen ibuprofen (ADVIL,MOTRIN) 200 MG tablet Take 200 mg by mouth every 6 (six) hours as needed. For pain  . metoprolol tartrate (LOPRESSOR) 50 MG tablet TAKE 1 TABLET BY MOUTH TWICE DAILY  . niacin 500 MG CR capsule Take 500 mg by mouth daily.   Marland Kitchen  Omega-3 Fatty Acids (FISH OIL) 1200 MG CAPS Take by mouth in the morning.   . pregabalin (LYRICA) 75 MG capsule TAKE 1 CAPSULE BY MOUTH TWO TIMES DAILY  . Vitamin D, Ergocalciferol, 2000 units CAPS Take by mouth.   No facility-administered medications prior to visit.    Review of Systems  Constitutional: Negative  for appetite change, chills, fatigue and fever.  Respiratory: Negative for chest tightness and shortness of breath.   Cardiovascular: Negative for chest pain and palpitations.  Gastrointestinal: Negative for abdominal pain, nausea and vomiting.  Neurological: Negative for dizziness and weakness.       Objective    BP (!) 141/63 (BP Location: Right Arm, Patient Position: Sitting, Cuff Size: Large)   Pulse (!) 52   Temp 98.3 F (36.8 C) (Oral)   Wt 193 lb (87.5 kg)   BMI 36.47 kg/m     Physical Exam Vitals reviewed.  Constitutional:      Appearance: She is well-developed. She is obese.  HENT:     Head: Normocephalic and atraumatic.     Right Ear: External ear normal.     Left Ear: External ear normal.     Nose: Nose normal.  Eyes:     Conjunctiva/sclera: Conjunctivae normal.     Pupils: Pupils are equal, round, and reactive to light.  Cardiovascular:     Rate and Rhythm: Normal rate and regular rhythm.     Heart sounds: Normal heart sounds.  Pulmonary:     Effort: Pulmonary effort is normal.     Breath sounds: Normal breath sounds.  Abdominal:     General: Bowel sounds are normal.     Palpations: Abdomen is soft.  Musculoskeletal:     Cervical back: Normal range of motion and neck supple.  Skin:    General: Skin is warm and dry.  Neurological:     General: No focal deficit present.     Mental Status: She is alert and oriented to person, place, and time.  Psychiatric:        Mood and Affect: Mood normal.        Behavior: Behavior normal.        Thought Content: Thought content normal.        Judgment: Judgment normal.       No results found for any visits on 07/09/20.  Assessment & Plan     1. Primary hypertension Fair control  2. Mixed hyperlipidemia Consider statin  3. Need for influenza vaccination  - Flu Vaccine QUAD High Dose(Fluad)  4. Need for COVID-19 vaccine  - [employer] Vaccine  5. Chronic pain of both knees May need orthopedic  referral - DG Knee Complete 4 Views Left - DG Knee Complete 4 Views Right  6. Localized osteoporosis without current pathological fracture   7. Chronic bilateral low back pain without sciatica   8. S/P total hysterectomy   9. Palpitations Is been worked up by cardiology and I think this is benign.  10. Class 2 severe obesity due to excess calories with serious comorbidity and body mass index (BMI) of 35.0 to 35.9 in adult [facility]) With above problems of hypertension hyperlipidemia and osteoarthritis   No follow-ups on file.         Lavette Yankovich Cranford Mon, MD  [facility] [phone] (phone) [phone] (fax)  [place]

## 2020-07-16 ENCOUNTER — Other Ambulatory Visit: Payer: Self-pay | Admitting: Family Medicine

## 2020-07-16 DIAGNOSIS — I10 Essential (primary) hypertension: Secondary | ICD-10-CM

## 2020-10-27 ENCOUNTER — Other Ambulatory Visit: Payer: Self-pay | Admitting: Podiatry

## 2020-10-27 NOTE — Telephone Encounter (Signed)
Please advise 

## 2020-10-28 ENCOUNTER — Other Ambulatory Visit: Payer: Self-pay

## 2020-10-28 ENCOUNTER — Ambulatory Visit: Payer: 59 | Admitting: Dermatology

## 2020-10-28 ENCOUNTER — Encounter: Payer: Self-pay | Admitting: Dermatology

## 2020-10-28 DIAGNOSIS — D1800 Hemangioma unspecified site: Secondary | ICD-10-CM | POA: Diagnosis not present

## 2020-10-28 DIAGNOSIS — L814 Other melanin hyperpigmentation: Secondary | ICD-10-CM

## 2020-10-28 DIAGNOSIS — L821 Other seborrheic keratosis: Secondary | ICD-10-CM | POA: Diagnosis not present

## 2020-10-28 DIAGNOSIS — Z85828 Personal history of other malignant neoplasm of skin: Secondary | ICD-10-CM | POA: Diagnosis not present

## 2020-10-28 NOTE — Patient Instructions (Signed)

## 2020-10-28 NOTE — Progress Notes (Signed)
   Follow-Up Visit   Subjective  Barbara Fletcher is a 70 y.o. female who presents for the following: Spot Check (Pt has a spot on her chest that she would like looked at. She is unsure how long it has been there. Pt states that is may be growing and that the spot is a few different colors. She denies any pain or itching. ).  Pt has hx of SCCIS at the right third fingernail bed that was treated with [facility].   The following portions of the chart were reviewed this encounter and updated as appropriate:      Review of Systems: No other skin or systemic complaints except as noted in HPI or Assessment and Plan.   Objective  Well appearing patient in no apparent distress; mood and affect are within normal limits.  A focused examination was performed including chest, right third fingernail, back. Relevant physical exam findings are noted in the Assessment and Plan.  Objective  Left upper sternum x 1: Stuck-on, waxy, tan-brown papules and plaques -- Discussed benign etiology and prognosis.   Waxy tan patch L upper sternum with darker waxy papules central  Assessment & Plan  Seborrheic keratosis Left upper sternum x 1  Reassured benign age-related growth.  Recommend observation.  Discussed cryotherapy if spot(s) become irritated or inflamed.   Lentigines - Scattered tan macules - Due to sun exposure - Benign-appering, observe - Recommend daily broad spectrum sunscreen SPF 30+ to sun-exposed areas, reapply every 2 hours as needed. - Call for any changes  Hemangiomas - Red papules - Discussed benign nature - Observe - Call for any changes  History of Squamous Cell Carcinoma in Situ of the Skin - No evidence of recurrence today R third fingernail bed - Recommend regular full body skin exams - Recommend daily broad spectrum sunscreen SPF 30+ to sun-exposed areas, reapply every 2 hours as needed.  - Call if any new or changing lesions are noted between office visits  Return if  symptoms worsen or fail to improve.   I, Harriett Sine, CMA, am acting as scribe for Brendolyn Patty, MD.  Documentation: I have reviewed the above documentation for accuracy and completeness, and I agree with the above.  Brendolyn Patty MD

## 2020-10-31 ENCOUNTER — Other Ambulatory Visit: Payer: Self-pay | Admitting: Family Medicine

## 2020-10-31 DIAGNOSIS — I10 Essential (primary) hypertension: Secondary | ICD-10-CM

## 2020-11-12 ENCOUNTER — Encounter: Payer: 59 | Admitting: Family Medicine

## 2020-11-13 ENCOUNTER — Other Ambulatory Visit: Payer: Self-pay | Admitting: Family Medicine

## 2020-11-13 DIAGNOSIS — I10 Essential (primary) hypertension: Secondary | ICD-10-CM

## 2020-11-13 NOTE — Telephone Encounter (Signed)
Requested Prescriptions  Pending Prescriptions Disp Refills  . amLODipine (NORVASC) 5 MG tablet [Pharmacy Med Name: AMLODIPINE BESYLATE 5 MG TA 5 Tablet] 30 tablet 3    Sig: TAKE 1 TABLET BY MOUTH DAILY     Cardiovascular:  Calcium Channel Blockers Failed - 11/13/2020  8:15 AM      Failed - Last BP in normal range    BP Readings from Last 1 Encounters:  07/09/20 (!) 141/63         Passed - Valid encounter within last 6 months    Recent Outpatient Visits          4 months ago Primary hypertension   [facility] Jerrol Banana., MD   1 year ago Palpitations   [facility] Jerrol Banana., MD   2 years ago Essential hypertension   [facility] Jerrol Banana., MD   2 years ago Annual physical exam   [facility] Jerrol Banana., MD   3 years ago Essential hypertension   [facility] Jerrol Banana., MD      Future Appointments            In 3 months Jerrol Banana., MD [facility], [place]   In 5 months [facility], [place], [place]

## 2020-12-12 ENCOUNTER — Other Ambulatory Visit: Payer: Self-pay

## 2020-12-12 MED FILL — Pregabalin Cap 75 MG: ORAL | 30 days supply | Qty: 60 | Fill #0 | Status: AC

## 2020-12-23 ENCOUNTER — Other Ambulatory Visit: Payer: Self-pay

## 2021-01-30 ENCOUNTER — Other Ambulatory Visit: Payer: Self-pay | Admitting: Family Medicine

## 2021-01-30 ENCOUNTER — Other Ambulatory Visit: Payer: Self-pay

## 2021-01-30 DIAGNOSIS — I10 Essential (primary) hypertension: Secondary | ICD-10-CM

## 2021-01-30 MED ORDER — METOPROLOL TARTRATE 50 MG PO TABS
ORAL_TABLET | Freq: Two times a day (BID) | ORAL | 0 refills | Status: DC
  Filled 2021-01-30: qty 180, 90d supply, fill #0

## 2021-02-09 ENCOUNTER — Other Ambulatory Visit: Payer: Self-pay

## 2021-02-11 ENCOUNTER — Other Ambulatory Visit: Payer: Self-pay

## 2021-02-11 ENCOUNTER — Other Ambulatory Visit: Payer: Self-pay | Admitting: Family Medicine

## 2021-02-11 DIAGNOSIS — I10 Essential (primary) hypertension: Secondary | ICD-10-CM

## 2021-02-11 MED ORDER — AMLODIPINE BESYLATE 5 MG PO TABS
ORAL_TABLET | Freq: Every day | ORAL | 0 refills | Status: DC
  Filled 2021-02-11: qty 90, 90d supply, fill #0

## 2021-02-11 NOTE — Telephone Encounter (Signed)
Requested medication (s) are due for refill today:  Yes  Requested medication (s) are on the active medication list:   Yes  Future visit scheduled:   Yes   Last ordered: 11/13/2020 #90, 0 refills  Needs new Rx reason returned   Requested Prescriptions  Pending Prescriptions Disp Refills   amLODipine (NORVASC) 5 MG tablet 90 tablet 0    Sig: TAKE 1 TABLET BY MOUTH DAILY      Cardiovascular:  Calcium Channel Blockers Failed - 02/11/2021  7:18 AM      Failed - Last BP in normal range    BP Readings from Last 1 Encounters:  07/09/20 (!) 141/63          Failed - Valid encounter within last 6 months    Recent Outpatient Visits           7 months ago Primary hypertension   [facility] Jerrol Banana., MD   1 year ago Palpitations   [facility] Jerrol Banana., MD   2 years ago Essential hypertension   [facility] Jerrol Banana., MD   2 years ago Annual physical exam   [facility] Jerrol Banana., MD   3 years ago Essential hypertension   [facility] Jerrol Banana., MD       Future Appointments             In 1 week Jerrol Banana., MD [facility], [place]   In 2 months [facility], [place], [place]

## 2021-02-23 ENCOUNTER — Encounter: Payer: 59 | Admitting: Family Medicine

## 2021-02-24 ENCOUNTER — Other Ambulatory Visit: Payer: Self-pay

## 2021-02-24 MED FILL — Pregabalin Cap 75 MG: ORAL | 30 days supply | Qty: 60 | Fill #1 | Status: AC

## 2021-04-07 MED FILL — Pregabalin Cap 75 MG: ORAL | 30 days supply | Qty: 60 | Fill #2 | Status: AC

## 2021-04-08 ENCOUNTER — Other Ambulatory Visit: Payer: Self-pay

## 2021-04-29 ENCOUNTER — Ambulatory Visit (INDEPENDENT_AMBULATORY_CARE_PROVIDER_SITE_OTHER): Payer: 59 | Admitting: Dermatology

## 2021-04-29 ENCOUNTER — Other Ambulatory Visit: Payer: Self-pay

## 2021-04-29 ENCOUNTER — Encounter: Payer: Self-pay | Admitting: Dermatology

## 2021-04-29 DIAGNOSIS — L304 Erythema intertrigo: Secondary | ICD-10-CM | POA: Diagnosis not present

## 2021-04-29 DIAGNOSIS — L578 Other skin changes due to chronic exposure to nonionizing radiation: Secondary | ICD-10-CM | POA: Diagnosis not present

## 2021-04-29 DIAGNOSIS — D1800 Hemangioma unspecified site: Secondary | ICD-10-CM

## 2021-04-29 DIAGNOSIS — L821 Other seborrheic keratosis: Secondary | ICD-10-CM | POA: Diagnosis not present

## 2021-04-29 DIAGNOSIS — Z86007 Personal history of in-situ neoplasm of skin: Secondary | ICD-10-CM | POA: Diagnosis not present

## 2021-04-29 DIAGNOSIS — L814 Other melanin hyperpigmentation: Secondary | ICD-10-CM | POA: Diagnosis not present

## 2021-04-29 DIAGNOSIS — D229 Melanocytic nevi, unspecified: Secondary | ICD-10-CM | POA: Diagnosis not present

## 2021-04-29 DIAGNOSIS — L570 Actinic keratosis: Secondary | ICD-10-CM | POA: Diagnosis not present

## 2021-04-29 DIAGNOSIS — Z1283 Encounter for screening for malignant neoplasm of skin: Secondary | ICD-10-CM | POA: Diagnosis not present

## 2021-04-29 NOTE — Progress Notes (Signed)
Follow-Up Visit   Subjective  Barbara Fletcher is a 70 y.o. female who presents for the following: TBSE (Patient here for full body skin exam and skin cancer screening. Patient with hx of SCCis. She is not aware of any new or changing spots. ).  Patient did come in a few months ago and saw Dr. Nicole Kindred for a spot at her chest. Spot was diagnosed as SK.   The following portions of the chart were reviewed this encounter and updated as appropriate:   Tobacco  Allergies  Meds  Problems  Med Hx  Surg Hx  Fam Hx      Review of Systems:  No other skin or systemic complaints except as noted in HPI or Assessment and Plan.  Objective  Well appearing patient in no apparent distress; mood and affect are within normal limits.  A full examination was performed including scalp, head, eyes, ears, nose, lips, neck, chest, axillae, abdomen, back, buttocks, bilateral upper extremities, bilateral lower extremities, hands, feet, fingers, toes, fingernails, and toenails. All findings within normal limits unless otherwise noted below.  low abdomen Erythematous patch right low abdomen  right forehead Erythematous thin papules/macules with gritty scale.    Assessment & Plan  Erythema intertrigo low abdomen  Intertrigo is a chronic recurrent rash that occurs in skin fold areas that may be associated with friction; heat; moisture; yeast; fungus; and bacteria.  It is exacerbated by increased movement / activity; sweating; and warmth  Continue ketoconazole 2% cream as needed Continue HC 2.5% cream as needed up to one week at a time  AK (actinic keratosis) right forehead  Prior to procedure, discussed risks of blister formation, small wound, skin dyspigmentation, or rare scar following cryotherapy. Recommend Vaseline ointment to treated areas while healing.   Destruction of lesion - right forehead  Destruction method: cryotherapy   Informed consent: discussed and consent obtained   Lesion  destroyed using liquid nitrogen: Yes   Cryotherapy cycles:  2 Outcome: patient tolerated procedure well with no complications   Post-procedure details: wound care instructions given    Lentigines - Scattered tan macules - Due to sun exposure - Benign-appering, observe - Recommend daily broad spectrum sunscreen SPF 30+ to sun-exposed areas, reapply every 2 hours as needed. - Call for any changes  Seborrheic Keratoses - Stuck-on, waxy, tan-brown papules and/or plaques  - Benign-appearing - Discussed benign etiology and prognosis. - Observe - Call for any changes  Melanocytic Nevi - Tan-brown and/or pink-flesh-colored symmetric macules and papules - Benign appearing on exam today - Observation - Call clinic for new or changing moles - Recommend daily use of broad spectrum spf 30+ sunscreen to sun-exposed areas.   Hemangiomas - Red papules - Discussed benign nature - Observe - Call for any changes  Actinic Damage - Chronic condition, secondary to cumulative UV/sun exposure - diffuse scaly erythematous macules with underlying dyspigmentation - Recommend daily broad spectrum sunscreen SPF 30+ to sun-exposed areas, reapply every 2 hours as needed.  - Staying in the shade or wearing long sleeves, sun glasses (UVA+UVB protection) and wide brim hats (4-inch brim around the entire circumference of the hat) are also recommended for sun protection.  - Call for new or changing lesions.  Skin cancer screening performed today.  History of Squamous Cell Carcinoma in Situ of the Skin - No evidence of recurrence today - Recommend regular full body skin exams - Recommend daily broad spectrum sunscreen SPF 30+ to sun-exposed areas, reapply every 2 hours as  needed.  - Call if any new or changing lesions are noted between office visits  Return in about 6 months (around 10/27/2021) for TBSE, AK follow up.  Graciella Belton, RMA, am acting as scribe for Forest Gleason, MD .  Documentation: I  have reviewed the above documentation for accuracy and completeness, and I agree with the above.  Forest Gleason, MD

## 2021-04-29 NOTE — Patient Instructions (Signed)
Cryotherapy Aftercare  Wash gently with soap and water everyday.   Apply Vaseline and Band-Aid daily until healed.   Prior to procedure, discussed risks of blister formation, small wound, skin dyspigmentation, or rare scar following cryotherapy. Recommend Vaseline ointment to treated areas while healing.  Recommend taking Heliocare sun protection supplement daily in sunny weather for additional sun protection. For maximum protection on the sunniest days, you can take up to 2 capsules of regular Heliocare OR take 1 capsule of Heliocare Ultra. For prolonged exposure (such as a full day in the sun), you can repeat your dose of the supplement 4 hours after your first dose. Heliocare can be purchased at [facility] or at [email or web address].    Melanoma ABCDEs  Melanoma is the most dangerous type of skin cancer, and is the leading cause of death from skin disease.  You are more likely to develop melanoma if you: Have light-colored skin, light-colored eyes, or red or blond hair Spend a lot of time in the sun Tan regularly, either outdoors or in a tanning bed Have had blistering sunburns, especially during childhood Have a close family member who has had a melanoma Have atypical moles or large birthmarks  Early detection of melanoma is key since treatment is typically straightforward and cure rates are extremely high if we catch it early.   The first sign of melanoma is often a change in a mole or a new dark spot.  The ABCDE system is a way of remembering the signs of melanoma.  A for asymmetry:  The two halves do not match. B for border:  The edges of the growth are irregular. C for color:  A mixture of colors are present instead of an even brown color. D for diameter:  Melanomas are usually (but not always) greater than 37mm - the size of a pencil eraser. E for evolution:  The spot keeps changing in size, shape, and color.  Please check your skin once per month between visits. You can  use a small mirror in front and a large mirror behind you to keep an eye on the back side or your body.   If you see any new or changing lesions before your next follow-up, please call to schedule a visit.  Please continue daily skin protection including broad spectrum sunscreen SPF 30+ to sun-exposed areas, reapplying every 2 hours as needed when you're outdoors.    If you have any questions or concerns for your doctor, please call our main line at [phone] and press option 4 to reach your doctor's medical assistant. If no one answers, please leave a voicemail as directed and we will return your call as soon as possible. Messages left after 4 pm will be answered the following business day.   You may also send [place] a message via [place]. We typically respond to MyChart messages within 1-2 business days.  For prescription refills, please ask your pharmacy to contact our office. Our fax number is [phone].  If you have an urgent issue when the clinic is closed that cannot wait until the next business day, you can page your doctor at the number below.    Please note that while we do our best to be available for urgent issues outside of office hours, we are not available 24/7.   If you have an urgent issue and are unable to reach [place], you may choose to seek medical care at your doctor's office, retail clinic, urgent care center, or emergency room.  If you have a medical emergency, please immediately call 911 or go to the emergency department.  Pager Numbers  - Dr. Nehemiah Massed: [phone]  - Dr. Laurence Ferrari: [phone]  - Dr. Nicole Kindred: [phone]  In the event of inclement weather, please call our main line at [phone] for an update on the status of any delays or closures.  Dermatology Medication Tips: Please keep the boxes that topical medications come in in order to help keep track of the instructions about where and how to use these. Pharmacies typically print the medication  instructions only on the boxes and not directly on the medication tubes.   If your medication is too expensive, please contact our office at [phone] option 4 or send [place] a message through [place].   We are unable to tell what your co-pay for medications will be in advance as this is different depending on your insurance coverage. However, we may be able to find a substitute medication at lower cost or fill out paperwork to get insurance to cover a needed medication.   If a prior authorization is required to get your medication covered by your insurance company, please allow [place] 1-2 business days to complete this process.  Drug prices often vary depending on where the prescription is filled and some pharmacies may offer cheaper prices.  The website www.goodrx.com contains coupons for medications through different pharmacies. The prices here do not account for what the cost may be with help from insurance (it may be cheaper with your insurance), but the website can give you the price if you did not use any insurance.  - You can print the associated coupon and take it with your prescription to the pharmacy.  - You may also stop by our office during regular business hours and pick up a GoodRx coupon card.  - If you need your prescription sent electronically to a different pharmacy, notify our office through [facility] or by phone at [phone] option 4.

## 2021-05-02 ENCOUNTER — Other Ambulatory Visit: Payer: Self-pay | Admitting: Family Medicine

## 2021-05-02 DIAGNOSIS — I10 Essential (primary) hypertension: Secondary | ICD-10-CM

## 2021-05-03 NOTE — Telephone Encounter (Signed)
Requested medication (s) are due for refill today: yes  Requested medication (s) are on the active medication list: yes  Last refill:  amlodipine:02/11/21              metoprolol: 01/30/21  Future visit scheduled: no  Notes to clinic:  called pt and LM on VM to call office to make an appt.    Requested Prescriptions  Pending Prescriptions Disp Refills   metoprolol tartrate (LOPRESSOR) 50 MG tablet 180 tablet 0    Sig: TAKE 1 TABLET BY MOUTH TWICE DAILY     Cardiovascular:  Beta Blockers Failed - 05/02/2021  8:49 PM      Failed - Last BP in normal range    BP Readings from Last 1 Encounters:  07/09/20 (!) 141/63          Failed - Valid encounter within last 6 months    Recent Outpatient Visits           9 months ago Primary hypertension   [facility] Jerrol Banana., MD   1 year ago Palpitations   [facility] Jerrol Banana., MD   2 years ago Essential hypertension   [facility] Jerrol Banana., MD   2 years ago Annual physical exam   [facility] Jerrol [facility]., MD   3 years ago Essential hypertension   [facility] Jerrol Banana., MD       Future Appointments             In 5 months [facility], [place], [place] in normal range    Pulse Readings from Last 1 Encounters:  07/09/20 (!) 52           amLODipine (NORVASC) 5 MG tablet 90 tablet 0    Sig: TAKE 1 TABLET BY MOUTH DAILY     Cardiovascular:  Calcium Channel Blockers Failed - 05/02/2021  8:49 PM      Failed - Last BP in normal range    BP Readings from Last 1 Encounters:  07/09/20 (!) 141/63          Failed - Valid encounter within last 6 months    Recent Outpatient Visits           9 months ago Primary hypertension   [facility] Jerrol Banana., MD   1 year ago Palpitations   [facility] Jerrol Banana., MD   2 years ago Essential hypertension   [facility] Jerrol Banana., MD   2 years ago Annual physical exam   [facility] Jerrol Banana., MD   3 years ago Essential hypertension   [facility] Jerrol Banana., MD       Future Appointments             In 5 months [facility], [place], MD [place]

## 2021-05-04 ENCOUNTER — Other Ambulatory Visit: Payer: Self-pay | Admitting: Family Medicine

## 2021-05-04 ENCOUNTER — Other Ambulatory Visit: Payer: Self-pay

## 2021-05-04 DIAGNOSIS — I10 Essential (primary) hypertension: Secondary | ICD-10-CM

## 2021-05-05 ENCOUNTER — Other Ambulatory Visit: Payer: Self-pay

## 2021-05-05 MED FILL — Amlodipine Besylate Tab 5 MG (Base Equivalent): ORAL | 90 days supply | Qty: 90 | Fill #0 | Status: AC

## 2021-05-05 MED FILL — Metoprolol Tartrate Tab 50 MG: ORAL | 90 days supply | Qty: 180 | Fill #0 | Status: AC

## 2021-05-05 NOTE — Telephone Encounter (Signed)
Pt has made an appt for Mon Oct 3 at 8 am. Medication: amLODipine (NORVASC) 5 MG tablet metoprolol tartrate (LOPRESSOR) 50 MG tablet Has the pt contacted their pharmacy? yes Preferred pharmacy: [place]  Pt returning Sara's call to make appt for refills. Can you please send in 30 days of each of these meds to get her through to 10/03 appt?

## 2021-05-06 ENCOUNTER — Other Ambulatory Visit: Payer: Self-pay

## 2021-06-01 ENCOUNTER — Ambulatory Visit: Payer: 59 | Admitting: Family Medicine

## 2021-06-08 ENCOUNTER — Other Ambulatory Visit: Payer: Self-pay

## 2021-06-16 ENCOUNTER — Other Ambulatory Visit: Payer: Self-pay

## 2021-06-16 ENCOUNTER — Telehealth: Payer: Self-pay

## 2021-06-16 ENCOUNTER — Encounter: Payer: Self-pay | Admitting: Family Medicine

## 2021-06-16 ENCOUNTER — Ambulatory Visit (INDEPENDENT_AMBULATORY_CARE_PROVIDER_SITE_OTHER): Payer: Medicare Other | Admitting: Family Medicine

## 2021-06-16 VITALS — BP 145/90 | HR 54 | Temp 98.5°F | Resp 16 | Ht 61.0 in | Wt 192.0 lb

## 2021-06-16 DIAGNOSIS — M816 Localized osteoporosis [Lequesne]: Secondary | ICD-10-CM | POA: Diagnosis not present

## 2021-06-16 DIAGNOSIS — E782 Mixed hyperlipidemia: Secondary | ICD-10-CM | POA: Diagnosis not present

## 2021-06-16 DIAGNOSIS — I10 Essential (primary) hypertension: Secondary | ICD-10-CM | POA: Diagnosis not present

## 2021-06-16 DIAGNOSIS — R739 Hyperglycemia, unspecified: Secondary | ICD-10-CM

## 2021-06-16 MED ORDER — TRIAMTERENE-HCTZ 75-50 MG PO TABS
1.0000 | ORAL_TABLET | Freq: Every day | ORAL | 1 refills | Status: DC
  Filled 2021-06-16: qty 90, 90d supply, fill #0

## 2021-06-16 NOTE — Telephone Encounter (Signed)
Copied from [place] [id number]. Topic: Medical Record Request - Other >> Jun 15, 2021  6:26 PM Erick Blinks wrote: Janace Hoard or anyone, Michel Harrow on behalf of [employer]. Janace Hoard is requesting an urgent call back regarding medical records.  Best contact: [phone]

## 2021-06-16 NOTE — Progress Notes (Signed)
I,[name],acting as a scribe for [name], MD.,have documented all relevant documentation on the behalf of [name], MD,as directed by  [name], MD while in the presence of [name], MD.   Established patient visit   Patient: Barbara Fletcher   DOB: 01-16-1951   70 y.o. Female  MRN: [id number] Visit Date: 06/16/2021  Today's healthcare provider: [name], MD   Chief Complaint  Patient presents with   Follow-up   Hypertension   Hyperlipidemia   Subjective    HPI  Patient comes in today for follow-up.  Has chronic fatigue and OA.  She has some headaches. She has been on Fosamax for 4 years. Hypertension, follow-up  BP Readings from Last 3 Encounters:  06/16/21 (!) 145/90  07/09/20 (!) 141/63  12/13/19 120/72   Wt Readings from Last 3 Encounters:  06/16/21 192 lb (87.1 kg)  07/09/20 193 lb (87.5 kg)  12/13/19 189 lb 12.8 oz (86.1 kg)     She was last seen for hypertension 11 months ago.  BP at that visit was as above. Management since that visit includes none.  She reports good compliance with treatment. She is not having side effects. none She is following a Regular, Low fat, Low Sodium, sugar  diet. She is not exercising. She does not smoke.  Use of agents associated with hypertension: none.   Outside blood pressures are 140/80 .  Pertinent labs: Lab Results  Component Value Date   CHOL 213 (H) 10/09/2019   HDL 55 10/09/2019   LDLCALC 127 (H) 10/09/2019   TRIG 175 (H) 10/09/2019   CHOLHDL 3.9 10/09/2019   Lab Results  Component Value Date   NA 143 10/09/2019   K 4.9 10/09/2019   CREATININE 0.83 10/09/2019   GFRNONAA 73 10/09/2019   GLUCOSE 98 10/09/2019     The 10-year ASCVD risk score (Arnett DK, et al., 2019) is: 26.4%   --------------------------------------------------------------------------------------------------- Lipid/Cholesterol, Follow-up  Last lipid panel Other pertinent labs   Lab Results  Component Value Date   CHOL 213 (H) 10/09/2019   HDL 55 10/09/2019   LDLCALC 127 (H) 10/09/2019   TRIG 175 (H) 10/09/2019   CHOLHDL 3.9 10/09/2019   Lab Results  Component Value Date   ALT 14 10/09/2019   AST 19 10/09/2019   PLT 271 10/09/2019   TSH 2.790 10/09/2019     She was last seen for this 11 months ago.  Management since that visit includes none.  She reports good compliance with treatment. She is not having side effects. none  Current diet: well balanced Current exercise: none  The 10-year ASCVD risk score (Arnett DK, et al., 2019) is: 26.4%  ---------------------------------------------------------------------------------------------------     Medications: Outpatient Medications Prior to Visit  Medication Sig   amLODipine (NORVASC) 5 MG tablet TAKE 1 TABLET BY MOUTH DAILY   ascorbic acid (VITAMIN C) 500 MG tablet Take 500 mg by mouth daily.   aspirin 81 MG chewable tablet Chew by mouth daily.   Biotin 10000 MCG TBDP Take by mouth.   fexofenadine-pseudoephedrine (ALLEGRA-D 24) 180-240 MG per 24 hr tablet Take 1 tablet by mouth daily as needed. Seasonal allergies    FIBER ADULT GUMMIES PO Take by mouth.   ibuprofen (ADVIL,MOTRIN) 200 MG tablet Take 200 mg by mouth every 6 (six) hours as needed. For pain   metoprolol tartrate (LOPRESSOR) 50 MG tablet TAKE 1 TABLET BY MOUTH TWICE DAILY   niacin 500  MG CR capsule Take 500 mg by mouth daily.    Omega-3 Fatty Acids (FISH OIL) 1200 MG CAPS Take by mouth in the morning.    OXYCODONE ER PO Take by mouth.   Vitamin D, Ergocalciferol, 2000 units CAPS Take by mouth.   pregabalin (LYRICA) 75 MG capsule TAKE 1 CAPSULE BY MOUTH TWO TIMES DAILY   [DISCONTINUED] alendronate (FOSAMAX) 70 MG tablet TAKE 1 TABLET BY MOUTH EVERY 7 DAYS. TAKE WITH A FULL GLASS OF WATER ON AN EMPTY STOMACH. (Patient not taking: Reported on 06/16/2021)   No facility-administered medications prior to visit.    Review of Systems   Constitutional:  Negative for appetite change, chills, fatigue and fever.  Respiratory:  Negative for chest tightness and shortness of breath.   Cardiovascular:  Negative for chest pain and palpitations.  Gastrointestinal:  Negative for abdominal pain, nausea and vomiting.  Skin:  Positive for wound.  Neurological:  Negative for dizziness and weakness.       Objective    BP (!) 145/90 (BP Location: Left Arm, Patient Position: Sitting, Cuff Size: Large)   Pulse (!) 54   Temp 98.5 F (36.9 C) (Temporal)   Resp 16   Ht 5\' 1"  (1.549 m)   Wt 192 lb (87.1 kg)   SpO2 97%   BMI 36.28 kg/m  BP Readings from Last 3 Encounters:  06/16/21 (!) 145/90  07/09/20 (!) 141/63  12/13/19 120/72   Wt Readings from Last 3 Encounters:  06/16/21 192 lb (87.1 kg)  07/09/20 193 lb (87.5 kg)  12/13/19 189 lb 12.8 oz (86.1 kg)      Physical Exam Vitals reviewed.  Constitutional:      Appearance: She is well-developed. She is obese.  HENT:     Head: Normocephalic and atraumatic.     Right Ear: External ear normal.     Left Ear: External ear normal.     Nose: Nose normal.  Eyes:     Conjunctiva/sclera: Conjunctivae normal.     Pupils: Pupils are equal, round, and reactive to light.  Cardiovascular:     Rate and Rhythm: Normal rate and regular rhythm.     Heart sounds: Normal heart sounds.  Pulmonary:     Effort: Pulmonary effort is normal.     Breath sounds: Normal breath sounds.  Abdominal:     General: Bowel sounds are normal.     Palpations: Abdomen is soft.  Musculoskeletal:     Cervical back: Normal range of motion and neck supple.  Skin:    General: Skin is warm and dry.  Neurological:     General: No focal deficit present.     Mental Status: She is alert and oriented to person, place, and time.  Psychiatric:        Mood and Affect: Mood normal.        Behavior: Behavior normal.        Thought Content: Thought content normal.        Judgment: Judgment normal.      No  results found for any visits on 06/16/21.  Assessment & Plan     1. Hyperglycemia Diet and exercise. - Comprehensive metabolic panel - Hemoglobin A1c - Lipid Panel With LDL/HDL Ratio  2. Mixed hyperlipidemia  - Lipid Panel With LDL/HDL Ratio  3. Primary hypertension Add Maxide. - CBC with Differential/Platelet - Comprehensive metabolic panel - TSH - triamterene-hydrochlorothiazide (MAXZIDE) 75-50 MG tablet; Take 1 tablet by mouth daily.  Dispense: 90 tablet; Refill: 1  4. Localized osteoporosis without current pathological fracture On Fosamax for past 4 years.  Continue for another 1 to 2 years.  Repeat BMD. - DG Bone Density    Return in about 4 months (around 10/17/2021).      I, [name], MD, have reviewed all documentation for this visit. The documentation on 06/21/21 for the exam, diagnosis, procedures, and orders are all accurate and complete.    Kailoni Vahle Cranford Mon, MD  [facility] [phone] (phone) [phone] (fax)  [place]

## 2021-06-16 NOTE — Telephone Encounter (Signed)
Copied from [place] [id number]. Topic: Medical Record Request - Other >> Jun 15, 2021  6:26 PM Erick Blinks wrote: Janace Hoard or anyone, Michel Harrow on behalf of [employer]. Janace Hoard is requesting an urgent call back regarding medical records.  Best contact: [phone]

## 2021-06-17 LAB — COMPREHENSIVE METABOLIC PANEL
ALT: 15 IU/L (ref 0–32)
AST: 28 IU/L (ref 0–40)
Albumin/Globulin Ratio: 1.6 (ref 1.2–2.2)
Albumin: 4.1 g/dL (ref 3.8–4.8)
Alkaline Phosphatase: 85 IU/L (ref 44–121)
BUN/Creatinine Ratio: 18 (ref 12–28)
BUN: 16 mg/dL (ref 8–27)
Bilirubin Total: 0.4 mg/dL (ref 0.0–1.2)
CO2: 21 mmol/L (ref 20–29)
Calcium: 9.7 mg/dL (ref 8.7–10.3)
Chloride: 105 mmol/L (ref 96–106)
Creatinine, Ser: 0.91 mg/dL (ref 0.57–1.00)
Globulin, Total: 2.5 g/dL (ref 1.5–4.5)
Glucose: 96 mg/dL (ref 70–99)
Potassium: 4.8 mmol/L (ref 3.5–5.2)
Sodium: 141 mmol/L (ref 134–144)
Total Protein: 6.6 g/dL (ref 6.0–8.5)
eGFR: 68 mL/min/{1.73_m2} (ref >59)

## 2021-06-17 LAB — TSH: TSH: 1.62 u[IU]/mL (ref 0.450–4.500)

## 2021-06-17 LAB — CBC WITH DIFFERENTIAL/PLATELET
Basophils Absolute: 0 10*3/uL (ref 0.0–0.2)
Basos: 1 %
EOS (ABSOLUTE): 0.4 10*3/uL (ref 0.0–0.4)
Eos: 6 %
Hematocrit: 41.2 % (ref 34.0–46.6)
Hemoglobin: 13.7 g/dL (ref 11.1–15.9)
Immature Grans (Abs): 0 10*3/uL (ref 0.0–0.1)
Immature Granulocytes: 0 %
Lymphocytes Absolute: 2.8 10*3/uL (ref 0.7–3.1)
Lymphs: 35 %
MCH: 30.6 pg (ref 26.6–33.0)
MCHC: 33.3 g/dL (ref 31.5–35.7)
MCV: 92 fL (ref 79–97)
Monocytes Absolute: 0.6 10*3/uL (ref 0.1–0.9)
Monocytes: 7 %
Neutrophils Absolute: 4 10*3/uL (ref 1.4–7.0)
Neutrophils: 51 %
Platelets: 306 10*3/uL (ref 150–450)
RBC: 4.48 x10E6/uL (ref 3.77–5.28)
RDW: 13.1 % (ref 11.7–15.4)
WBC: 7.9 10*3/uL (ref 3.4–10.8)

## 2021-06-17 LAB — HEMOGLOBIN A1C
Est. average glucose Bld gHb Est-mCnc: 131 mg/dL
Hgb A1c MFr Bld: 6.2 % — ABNORMAL HIGH (ref 4.8–5.6)

## 2021-06-17 LAB — LIPID PANEL WITH LDL/HDL RATIO
Cholesterol, Total: 211 mg/dL — ABNORMAL HIGH (ref 100–199)
HDL: 53 mg/dL (ref >39)
LDL Chol Calc (NIH): 131 mg/dL — ABNORMAL HIGH (ref 0–99)
LDL/HDL Ratio: 2.5 ratio (ref 0.0–3.2)
Triglycerides: 151 mg/dL — ABNORMAL HIGH (ref 0–149)
VLDL Cholesterol Cal: 27 mg/dL (ref 5–40)

## 2021-06-18 ENCOUNTER — Other Ambulatory Visit: Payer: Self-pay

## 2021-06-19 ENCOUNTER — Ambulatory Visit: Payer: Self-pay | Admitting: *Deleted

## 2021-06-19 NOTE — Telephone Encounter (Signed)
Left message on voicemail, as per her request.

## 2021-06-19 NOTE — Telephone Encounter (Signed)
Request has been forwarded to [facility] for processing. Thanks TNP

## 2021-06-19 NOTE — Telephone Encounter (Signed)
Pt given lab results per notes of Dr. Rosanna Randy from 06/19/21 on 06/19/21. Pt verbalized understanding. Patient requesting in the future if labs are stable , please leave message on voicemail and requesting only for a return call required back ,if labs are abnormal.

## 2021-06-25 ENCOUNTER — Other Ambulatory Visit: Payer: Self-pay | Admitting: Podiatry

## 2021-06-25 ENCOUNTER — Other Ambulatory Visit: Payer: Self-pay

## 2021-06-25 MED FILL — Pregabalin Cap 75 MG: ORAL | 30 days supply | Qty: 60 | Fill #0 | Status: AC

## 2021-06-25 NOTE — Telephone Encounter (Signed)
Patient needs an appointment for medication refill, not seen since 03/ 2019

## 2021-06-30 ENCOUNTER — Other Ambulatory Visit: Payer: Self-pay

## 2021-06-30 DIAGNOSIS — M810 Age-related osteoporosis without current pathological fracture: Secondary | ICD-10-CM | POA: Diagnosis not present

## 2021-06-30 DIAGNOSIS — Z1231 Encounter for screening mammogram for malignant neoplasm of breast: Secondary | ICD-10-CM | POA: Diagnosis not present

## 2021-06-30 DIAGNOSIS — M85851 Other specified disorders of bone density and structure, right thigh: Secondary | ICD-10-CM | POA: Diagnosis not present

## 2021-06-30 DIAGNOSIS — M85852 Other specified disorders of bone density and structure, left thigh: Secondary | ICD-10-CM | POA: Diagnosis not present

## 2021-06-30 LAB — HM MAMMOGRAPHY

## 2021-06-30 LAB — HM DEXA SCAN: HM Dexa Scan: ABNORMAL

## 2021-07-02 ENCOUNTER — Encounter: Payer: Self-pay | Admitting: *Deleted

## 2021-07-15 DIAGNOSIS — M25552 Pain in left hip: Secondary | ICD-10-CM | POA: Diagnosis not present

## 2021-07-20 ENCOUNTER — Ambulatory Visit: Payer: Medicare Other | Admitting: Podiatry

## 2021-07-20 ENCOUNTER — Other Ambulatory Visit: Payer: Self-pay

## 2021-07-20 DIAGNOSIS — G629 Polyneuropathy, unspecified: Secondary | ICD-10-CM | POA: Diagnosis not present

## 2021-07-20 MED ORDER — MELOXICAM 15 MG PO TABS
15.0000 mg | ORAL_TABLET | Freq: Every day | ORAL | 3 refills | Status: AC
  Filled 2021-07-20: qty 30, 30d supply, fill #0
  Filled 2021-08-24: qty 30, 30d supply, fill #1
  Filled 2021-09-28: qty 30, 30d supply, fill #2
  Filled 2022-05-04: qty 30, 30d supply, fill #3

## 2021-07-20 MED ORDER — PREGABALIN 75 MG PO CAPS
75.0000 mg | ORAL_CAPSULE | Freq: Two times a day (BID) | ORAL | 3 refills | Status: DC
  Filled 2021-07-20: qty 180, 90d supply, fill #0

## 2021-07-20 NOTE — Progress Notes (Signed)
She presents today with her husband Dr. Natale Milch for follow-up of her Lyrica.  At this point she states that her arthritis seems to be getting a little worse but the neuropathy seems to be holding its own.  States that definitely the pregabalin or Lyrica helps a lot particularly at nighttime.  Really does not do a lot for her arthritis.  Objective: Vital signs are stable alert oriented x3 muscle strength is normal symmetrical neurovascular status is intact.  No open lesions or wounds are noted.  Assessment: Diabetes with diabetic peripheral neuropathy.  Plan: Refilled her for Gamblin 75 mg 1 p.o. twice daily and also started her on meloxicam 15 mg daily for a week at a time and then discontinue for a week or so.  She understands this and is amenable to it.  Follow-up with me in 1 year for prescription renewal

## 2021-07-24 ENCOUNTER — Other Ambulatory Visit: Payer: Self-pay | Admitting: Family Medicine

## 2021-07-27 ENCOUNTER — Other Ambulatory Visit: Payer: Self-pay

## 2021-07-27 ENCOUNTER — Other Ambulatory Visit: Payer: Self-pay | Admitting: Family Medicine

## 2021-07-27 MED ORDER — ALENDRONATE SODIUM 70 MG PO TABS
70.0000 mg | ORAL_TABLET | ORAL | 4 refills | Status: AC
  Filled 2021-07-27: qty 12, 84d supply, fill #0

## 2021-07-29 ENCOUNTER — Encounter: Payer: Self-pay | Admitting: Interventional Cardiology

## 2021-07-30 ENCOUNTER — Encounter: Payer: Self-pay | Admitting: Interventional Cardiology

## 2021-08-04 ENCOUNTER — Other Ambulatory Visit: Payer: Self-pay

## 2021-08-04 ENCOUNTER — Other Ambulatory Visit: Payer: Self-pay | Admitting: Family Medicine

## 2021-08-04 DIAGNOSIS — I10 Essential (primary) hypertension: Secondary | ICD-10-CM

## 2021-08-04 MED ORDER — METOPROLOL TARTRATE 50 MG PO TABS
ORAL_TABLET | Freq: Two times a day (BID) | ORAL | 0 refills | Status: DC
  Filled 2021-08-04: qty 180, 90d supply, fill #0

## 2021-08-10 ENCOUNTER — Other Ambulatory Visit: Payer: Self-pay

## 2021-08-10 ENCOUNTER — Other Ambulatory Visit: Payer: Self-pay | Admitting: Family Medicine

## 2021-08-10 DIAGNOSIS — I10 Essential (primary) hypertension: Secondary | ICD-10-CM

## 2021-08-10 MED ORDER — AMLODIPINE BESYLATE 5 MG PO TABS
ORAL_TABLET | Freq: Every day | ORAL | 0 refills | Status: DC
  Filled 2021-08-10: qty 90, 90d supply, fill #0

## 2021-08-10 MED FILL — Pregabalin Cap 75 MG: ORAL | 30 days supply | Qty: 60 | Fill #1 | Status: AC

## 2021-08-14 DIAGNOSIS — M25552 Pain in left hip: Secondary | ICD-10-CM | POA: Diagnosis not present

## 2021-08-24 ENCOUNTER — Other Ambulatory Visit: Payer: Self-pay

## 2021-08-25 ENCOUNTER — Other Ambulatory Visit: Payer: Self-pay

## 2021-08-26 ENCOUNTER — Encounter: Payer: Self-pay | Admitting: Interventional Cardiology

## 2021-08-27 ENCOUNTER — Encounter: Payer: Self-pay | Admitting: Interventional Cardiology

## 2021-09-18 DIAGNOSIS — M25552 Pain in left hip: Secondary | ICD-10-CM | POA: Diagnosis not present

## 2021-09-28 ENCOUNTER — Other Ambulatory Visit: Payer: Self-pay

## 2021-10-02 ENCOUNTER — Other Ambulatory Visit: Payer: Self-pay

## 2021-10-02 ENCOUNTER — Encounter: Payer: Self-pay | Admitting: Interventional Cardiology

## 2021-10-02 MED ORDER — METOPROLOL TARTRATE 50 MG PO TABS
ORAL_TABLET | ORAL | 3 refills | Status: DC
  Filled 2021-10-02: qty 225, 90d supply, fill #0
  Filled 2022-01-21: qty 225, 90d supply, fill #1
  Filled 2022-05-04: qty 225, 90d supply, fill #2
  Filled 2022-05-17: qty 225, 90d supply, fill #3
  Filled 2022-09-06: qty 225, 90d supply, fill #4

## 2021-10-02 MED ORDER — APIXABAN 5 MG PO TABS
5.0000 mg | ORAL_TABLET | Freq: Two times a day (BID) | ORAL | 3 refills | Status: DC
  Filled 2021-10-02: qty 60, 30d supply, fill #0

## 2021-10-02 NOTE — Telephone Encounter (Signed)
°  Patient's husband, Simona Huh saw me and brought Jodelle Red tracing which show that the patient Misk[place]) has been having paroxysmal AFib.  She needs to be started on Eliquis 5 mg BID.   She will need f/u appt with me.  Her metoprolol has been increased to 50 mg BID with an extra 25 mg at lunch.    Would also like to give her samples of ELiquis. Normal Hbg and Cr.  Husband is in the office.    Jettie Booze, MD

## 2021-10-02 NOTE — Telephone Encounter (Signed)
Called patient and made her aware of medication changes. Patient verbalized understanding. Follow up visit with Dr. Irish Lack has been scheduled.

## 2021-10-05 ENCOUNTER — Other Ambulatory Visit: Payer: Self-pay

## 2021-10-09 NOTE — Progress Notes (Signed)
Established patient visit  I,[name],acting as a scribe for Barbara Durie, MD.,have documented all relevant documentation on the behalf of Barbara Durie, MD,as directed by  Barbara Durie, MD while in the presence of Barbara Durie, MD.   Patient: Barbara Fletcher   DOB: [birth year]   71 y.o. Female  MRN: [id number] Visit Date: 10/12/2021  Today's healthcare provider: [name], MD   Chief Complaint  Patient presents with   Follow-up   Hypertension   Hyperlipidemia   Prediabetes   Subjective    HPI  She comes in today for follow-up.  She has new onset A-fib and is now on 50 mg twice daily of metoprolol and 25 mg at lunch.  She is on full dose Eliquis.  She sees cardiology in a week. Overall she is feeling fairly well.  She is still having posterior headaches especially at night and is taking 1 Lyrica at night.  This is thought to be contributed to by cervical disc disease.  Hypertension, follow-up  BP Readings from Last 3 Encounters:  10/12/21 116/74  06/16/21 (!) 145/90  07/09/20 (!) 141/63   Wt Readings from Last 3 Encounters:  10/12/21 188 lb (85.3 kg)  06/16/21 192 lb (87.1 kg)  07/09/20 193 lb (87.5 kg)     She was last seen for hypertension 4 months ago.  BP at that visit was 145/90. Management since that visit includes; Added Maxide. She reports good compliance with treatment. She is not having side effects. none She is not exercising. She is adherent to low salt diet.   Outside blood pressures are 140/70.  She does not smoke.  Use of agents associated with hypertension: none.   --------------------------------------------------------------------------------------------------- Lipid/Cholesterol, follow-up  Last Lipid Panel: Lab Results  Component Value Date   CHOL 211 (H) 06/16/2021   LDLCALC 131 (H) 06/16/2021   HDL 53 06/16/2021   TRIG 151 (H) 06/16/2021    She was last seen for this 4 months ago.  Management  since that visit includes; labs checked showing-stable.  She reports good compliance with treatment. She is not having side effects. none  She is following a Regular, Low Sodium diet. Current exercise: none  Last metabolic panel Lab Results  Component Value Date   GLUCOSE 96 06/16/2021   NA 141 06/16/2021   K 4.8 06/16/2021   BUN 16 06/16/2021   CREATININE 0.91 06/16/2021   EGFR 68 06/16/2021   GFRNONAA 73 10/09/2019   CALCIUM 9.7 06/16/2021   AST 28 06/16/2021   ALT 15 06/16/2021   The 10-year ASCVD risk score (Arnett DK, et al., 2019) is: 19.7%  --------------------------------------------------------------------------------------------------- Prediabetes, Follow-up  Lab Results  Component Value Date   HGBA1C 6.2 (H) 06/16/2021   HGBA1C 6.0 (H) 10/31/2017   HGBA1C 6.1 (H) 03/10/2017   GLUCOSE 96 06/16/2021   GLUCOSE 98 10/09/2019   GLUCOSE 101 (H) 06/21/2018    Last seen for for this4 months ago.  Management since that visit includes; advised Diet and exercise. Current symptoms include none and have been unchanged.  Prior visit with dietician: no Current diet: well balanced Current exercise: none  Pertinent Labs:    Component Value Date/Time   CHOL 211 (H) 06/16/2021 1311   TRIG 151 (H) 06/16/2021 1311   CHOLHDL 3.9 10/09/2019 0834   CREATININE 0.91 06/16/2021 1311    Wt Readings from Last 3 Encounters:  10/12/21 188 lb (85.3 kg)  06/16/21 192 lb (87.1 kg)  07/09/20 193 lb (87.5 kg)    -----------------------------------------------------------------------------------------   Medications: Outpatient Medications Prior to Visit  Medication Sig   alendronate (FOSAMAX) 70 MG tablet Take 1 tablet (70 mg total) by mouth once a week. Take with a full glass of water on an empty stomach.   amLODipine (NORVASC) 5 MG tablet TAKE 1 TABLET BY MOUTH DAILY   apixaban (ELIQUIS) 5 MG TABS tablet Take 1 tablet (5 mg total) by mouth 2 (two) times daily.   ascorbic  acid (VITAMIN C) 500 MG tablet Take 500 mg by mouth daily.   Biotin 10000 MCG TBDP Take by mouth.   fexofenadine-pseudoephedrine (ALLEGRA-D 24) 180-240 MG per 24 hr tablet Take 1 tablet by mouth daily as needed. Seasonal allergies    FIBER ADULT GUMMIES PO Take by mouth.   meloxicam (MOBIC) 15 MG tablet Take 1 tablet (15 mg total) by mouth daily.   metoprolol tartrate (LOPRESSOR) 50 MG tablet Take 1 tablet (50 mg) every morning, 1/2 tablet (25 mg) at lunch time, and 1 tablet (50 mg) in the evening   niacin 500 MG CR capsule Take 500 mg by mouth daily.    Omega-3 Fatty Acids (FISH OIL) 1200 MG CAPS Take by mouth in the morning.    OXYCODONE ER PO Take by mouth.   pregabalin (LYRICA) 75 MG capsule TAKE 1 CAPSULE BY MOUTH TWO TIMES DAILY   triamterene-hydrochlorothiazide (MAXZIDE) 75-50 MG tablet Take 1 tablet by mouth daily.   Vitamin D, Ergocalciferol, 2000 units CAPS Take by mouth.   [DISCONTINUED] aspirin 81 MG chewable tablet Chew by mouth daily. (Patient not taking: Reported on 10/12/2021)   [DISCONTINUED] ibuprofen (ADVIL,MOTRIN) 200 MG tablet Take 200 mg by mouth every 6 (six) hours as needed. For pain (Patient not taking: Reported on 10/12/2021)   [DISCONTINUED] pregabalin (LYRICA) 75 MG capsule Take 1 capsule (75 mg total) by mouth 2 (two) times daily. (Patient not taking: Reported on 10/12/2021)   No facility-administered medications prior to visit.    Review of Systems  Constitutional:  Negative for appetite change, chills, fatigue and fever.  Respiratory:  Negative for chest tightness and shortness of breath.   Cardiovascular:  Negative for chest pain and palpitations.  Gastrointestinal:  Negative for abdominal pain, nausea and vomiting.  Neurological:  Negative for dizziness and weakness.       Objective    BP 116/74 (BP Location: Left Arm, Patient Position: Sitting, Cuff Size: Normal)    Pulse 60    Resp 18    Ht $R'5\' 3"'qA$  (1.6 m)    Wt 188 lb (85.3 kg)    SpO2 94%    BMI 33.30  kg/m  BP Readings from Last 3 Encounters:  10/12/21 116/74  06/16/21 (!) 145/90  07/09/20 (!) 141/63   Wt Readings from Last 3 Encounters:  10/12/21 188 lb (85.3 kg)  06/16/21 192 lb (87.1 kg)  07/09/20 193 lb (87.5 kg)      Physical Exam Vitals reviewed.  Constitutional:      Appearance: She is well-developed. She is obese.  HENT:     Head: Normocephalic and atraumatic.     Right Ear: External ear normal.     Left Ear: External ear normal.     Nose: Nose normal.  Eyes:     Conjunctiva/sclera: Conjunctivae normal.     Pupils: Pupils are equal, round, and reactive to light.  Cardiovascular:     Rate and Rhythm: Normal rate and regular rhythm.     Heart  sounds: Normal heart sounds.  Pulmonary:     Effort: Pulmonary effort is normal.     Breath sounds: Normal breath sounds.  Abdominal:     General: Bowel sounds are normal.     Palpations: Abdomen is soft.  Musculoskeletal:     Cervical back: Normal range of motion and neck supple.  Skin:    General: Skin is warm and dry.  Neurological:     General: No focal deficit present.     Mental Status: She is alert and oriented to person, place, and time.  Psychiatric:        Mood and Affect: Mood normal.        Behavior: Behavior normal.        Thought Content: Thought content normal.        Judgment: Judgment normal.      No results found for any visits on 10/12/21.  Assessment & Plan     1. Primary hypertension Good control.  2. Paroxysmal atrial fibrillation (HCC) Continue metoprolol 3 times daily and now Eliquis  3. Mixed hyperlipidemia   4. Hyperglycemia A1c when appropriate  5. Need for pneumococcal vaccination  - Pneumococcal conjugate vaccine 20-valent (Prevnar 20)  6. Class 2 severe obesity due to excess calories with serious comorbidity and body mass index (BMI) of 35.0 to 35.9 in adult [facility])   7. Cervical radiculopathy Try Lyrica 2 in the evening for her headaches from her cervical  radiculopathy   No follow-ups on file.      I, Barbara Durie, MD, have reviewed all documentation for this visit. The documentation on 10/17/21 for the exam, diagnosis, procedures, and orders are all accurate and complete.    Nila Winker Cranford Mon, MD  [facility] [phone] (phone) [phone] (fax)  [place]

## 2021-10-12 ENCOUNTER — Encounter: Payer: Self-pay | Admitting: Family Medicine

## 2021-10-12 ENCOUNTER — Ambulatory Visit (INDEPENDENT_AMBULATORY_CARE_PROVIDER_SITE_OTHER): Payer: Medicare Other | Admitting: Family Medicine

## 2021-10-12 VITALS — BP 116/74 | HR 60 | Resp 18 | Ht 63.0 in | Wt 188.0 lb

## 2021-10-12 DIAGNOSIS — I480 Paroxysmal atrial fibrillation: Secondary | ICD-10-CM | POA: Diagnosis not present

## 2021-10-12 DIAGNOSIS — E782 Mixed hyperlipidemia: Secondary | ICD-10-CM | POA: Diagnosis not present

## 2021-10-12 DIAGNOSIS — R739 Hyperglycemia, unspecified: Secondary | ICD-10-CM

## 2021-10-12 DIAGNOSIS — Z23 Encounter for immunization: Secondary | ICD-10-CM | POA: Diagnosis not present

## 2021-10-12 DIAGNOSIS — I10 Essential (primary) hypertension: Secondary | ICD-10-CM

## 2021-10-12 DIAGNOSIS — Z6835 Body mass index (BMI) 35.0-35.9, adult: Secondary | ICD-10-CM

## 2021-10-12 DIAGNOSIS — M5412 Radiculopathy, cervical region: Secondary | ICD-10-CM

## 2021-10-12 NOTE — Patient Instructions (Signed)
TRY TWO LYRICIA AT NIGHT.

## 2021-10-16 ENCOUNTER — Other Ambulatory Visit: Payer: Self-pay

## 2021-10-16 MED ORDER — ZOSTER VAC RECOMB ADJUVANTED 50 MCG/0.5ML IM SUSR
INTRAMUSCULAR | 1 refills | Status: DC
  Filled 2021-10-16 – 2021-10-20 (×3): qty 0.5, 1d supply, fill #0

## 2021-10-19 ENCOUNTER — Other Ambulatory Visit: Payer: Self-pay

## 2021-10-19 MED FILL — Pregabalin Cap 75 MG: ORAL | 30 days supply | Qty: 60 | Fill #2 | Status: AC

## 2021-10-20 ENCOUNTER — Other Ambulatory Visit: Payer: Self-pay

## 2021-10-20 NOTE — Progress Notes (Signed)
Cardiology Office Note   Date:  10/21/2021   ID:  Barbara Fletcher, DOB 02-28-1951, MRN [id number]  PCP:  Jerrol Banana., MD    No chief complaint on file.  PAF  Wt Readings from Last 3 Encounters:  10/21/21 185 lb 3.2 oz (84 kg)  10/12/21 188 lb (85.3 kg)  06/16/21 192 lb (87.1 kg)       History of Present Illness: Barbara Fletcher is a 71 y.o. female  who I saw in 2021 for palpitations.  She noted heart rates to 180s at that time.  Her husband is my patient.  He is a retired Public librarian.  I saw him in early February 2023.  He brought in [place] strips from her showing atrial fibrillation with rapid ventricular response.  Eliquis was started.  Denies : Chest pain. Dizziness. Leg edema. Nitroglycerin use. Orthopnea. Palpitations. Paroxysmal nocturnal dyspnea. Syncope.    She walks some, but is limited by [facility] when walking an incline.  She is aslo limited by back pain.      Since additional 25 mg metoprolol was added at lunchtime, she has not had any further palpitations.    Past Medical History:  Diagnosis Date   Arthritis    Cervical radiculopathy    Chronic bilateral low back pain without sciatica    Class 2 severe obesity due to excess calories with serious comorbidity and body mass index (BMI) of 35.0 to 35.9 in adult [facility])    Diverticulitis    Frequent headaches    Headaches, cluster    Hyperlipidemia    Hypertension    Osteoporosis without current pathological fracture    Palpitations    Squamous cell carcinoma of skin 11/24/2017   SCCIS, right third fingernail - tx with [facility]    Past Surgical History:  Procedure Laterality Date   ABDOMINAL HYSTERECTOMY  1990   BACK SURGERY     lumbar fussion   [facility]     abd diverticulitis with colostomy, reversal 2007- 2008   LAPAROTOMY  02/13/2012   Procedure: EXPLORATORY LAPAROTOMY;  Surgeon: Rolm Bookbinder, MD;  Location: [place];   Service: General;  Laterality: N/A;  Exploratory Laparotomy with small bowel resection, lysis of adhension and primary ventral hernia repair   [place]     Current Outpatient Medications  Medication Sig Dispense Refill   alendronate (FOSAMAX) 70 MG tablet Take 1 tablet (70 mg total) by mouth once a week. Take with a full glass of water on an empty stomach. 12 tablet 4   amLODipine (NORVASC) 5 MG tablet TAKE 1 TABLET BY MOUTH DAILY 90 tablet 0   apixaban (ELIQUIS) 5 MG TABS tablet Take 1 tablet (5 mg total) by mouth 2 (two) times daily. 180 tablet 3   ascorbic acid (VITAMIN C) 500 MG tablet Take 500 mg by mouth daily.     Biotin 10000 MCG TBDP Take by mouth.     fexofenadine-pseudoephedrine (ALLEGRA-D 24) 180-240 MG per 24 hr tablet Take 1 tablet by mouth daily as needed. Seasonal allergies      FIBER ADULT GUMMIES PO Take by mouth.     meloxicam (MOBIC) 7.5 MG tablet Take 7.5 mg by mouth daily.     metoprolol tartrate (LOPRESSOR) 50 MG tablet Take 1 tablet (50 mg) every morning, 1/2 tablet (  25 mg) at lunch time, and 1 tablet (50 mg) in the evening 270 tablet 3   niacin 500 MG CR capsule Take 500 mg by mouth daily.      Omega-3 Fatty Acids (FISH OIL) 1200 MG CAPS Take by mouth in the morning.      OXYCODONE ER PO Take by mouth.     pregabalin (LYRICA) 75 MG capsule TAKE 1 CAPSULE BY MOUTH TWO TIMES DAILY 60 capsule 5   triamterene-hydrochlorothiazide (MAXZIDE) 75-50 MG tablet Take 0.5 tablets by mouth daily. Pt. Takes 1/2 tablet once a day.     Vitamin D, Ergocalciferol, 2000 units CAPS Take by mouth.     Zoster Vaccine Adjuvanted [facility]) injection Inject into the muscle. 0.5 mL 1   No current facility-administered medications for this visit.    Allergies:   Levsin [hyoscyamine sulfate], Levsin [hyoscyamine], and Lisinopril    Social History:  The patient  reports that she has never smoked. She has never used smokeless tobacco. She reports  current alcohol use of about 1.0 standard drink per week. She reports that she does not use drugs.   Family History:  The patient's family history includes Heart failure in her mother.    ROS:  Please see the history of present illness.   Otherwise, review of systems are positive for back pain.   All other systems are reviewed and negative.    PHYSICAL EXAM: VS:  BP 118/70    Pulse (!) 59    Ht 5\' 3"  (1.6 m)    Wt 185 lb 3.2 oz (84 kg)    SpO2 100%    BMI 32.81 kg/m  , BMI Body mass index is 32.81 kg/m. GEN: Well nourished, well developed, in no acute distress HEENT: normal Neck: no JVD, carotid bruits, or masses Cardiac: RRR; no murmurs, rubs, or gallops,no edema  Respiratory:  clear to auscultation bilaterally, normal work of breathing GI: soft, nontender, nondistended, + BS MS: no deformity or atrophy Skin: warm and dry, no rash Neuro:  Strength and sensation are intact Psych: euthymic mood, full affect   EKG:   The ekg ordered today demonstrates normal sinus rhythm, left axis deviation, no ST segment changes   Recent Labs: 06/16/2021: ALT 15; BUN 16; Creatinine, Ser 0.91; Hemoglobin 13.7; Platelets 306; Potassium 4.8; Sodium 141; TSH 1.620   Lipid Panel    Component Value Date/Time   CHOL 211 (H) 06/16/2021 1311   TRIG 151 (H) 06/16/2021 1311   HDL 53 06/16/2021 1311   CHOLHDL 3.9 10/09/2019 0834   LDLCALC 131 (H) 06/16/2021 1311     Other studies Reviewed: Additional studies/ records that were reviewed today with results demonstrating: Labs reviewed.  TSH was normal in October 2022.  Renal function was normal at that time.   ASSESSMENT AND PLAN:  PAF: Eliquis for stroke prevention.  Plan for echocardiogram to evaluate for any structural heart disease.  Eliquis was started a few weeks ago.  Will check CBC, bmet, TSH.  Not much room to increase metoprolol if she has any further breakthroughs of A-fib.  Her symptoms have been well controlled.  We spoke about the  potential need for antiarrhythmic medications or potentially ablation if symptoms become refractory. HTN: Low-salt diet.  Avoid processed foods. Hyperlipidemia: Whole food, plant-based diet.  LDL 131 in October 2022.  HDL 53.  Currently on just niacin and fish oil.  She has not been on a statin before. Dyspnea on exertion: Given risk factors for CAD, will  plan for coronary CTA to evaluate for obstructive coronary artery disease..  Calcium score will also help to see whether she needs more aggressive therapy for her lipid-lowering than just niacin and fish oil.  She takes a high dose of metoprolol on a daily basis.  Her heart rate is 59 in the clinic today.  We will just have her take her regular dose of metoprolol. Obesity: Long-term issue.  Hopefully, she can increase exercise.   Current medicines are reviewed at length with the patient today.  The patient concerns regarding her medicines were addressed.  The following changes have been made:  No change  Labs/ tests ordered today include:  No orders of the defined types were placed in this encounter.   Recommend 150 minutes/week of aerobic exercise Low fat, low carb, high fiber diet recommended  Disposition:   FU in 6 months   Signed, Larae Grooms, MD  10/21/2021 9:34 AM    [facility] [place], [place], [place]  [address] Phone: [phone]; Fax: [phone]

## 2021-10-21 ENCOUNTER — Ambulatory Visit: Payer: Medicare Other | Admitting: Interventional Cardiology

## 2021-10-21 ENCOUNTER — Other Ambulatory Visit: Payer: Self-pay

## 2021-10-21 ENCOUNTER — Encounter: Payer: Self-pay | Admitting: Interventional Cardiology

## 2021-10-21 VITALS — BP 118/70 | HR 59 | Ht 63.0 in | Wt 185.2 lb

## 2021-10-21 DIAGNOSIS — I480 Paroxysmal atrial fibrillation: Secondary | ICD-10-CM | POA: Diagnosis not present

## 2021-10-21 DIAGNOSIS — E782 Mixed hyperlipidemia: Secondary | ICD-10-CM | POA: Diagnosis not present

## 2021-10-21 DIAGNOSIS — I10 Essential (primary) hypertension: Secondary | ICD-10-CM | POA: Diagnosis not present

## 2021-10-21 DIAGNOSIS — R0609 Other forms of dyspnea: Secondary | ICD-10-CM

## 2021-10-21 MED ORDER — APIXABAN 5 MG PO TABS
5.0000 mg | ORAL_TABLET | Freq: Two times a day (BID) | ORAL | 3 refills | Status: DC
Start: 2021-10-21 — End: 2022-11-09
  Filled 2021-10-21: qty 60, 30d supply, fill #0
  Filled 2021-11-29: qty 60, 30d supply, fill #1
  Filled 2021-12-30: qty 60, 30d supply, fill #2
  Filled 2022-01-27: qty 60, 30d supply, fill #3
  Filled 2022-02-28: qty 60, 30d supply, fill #4
  Filled 2022-04-06: qty 60, 30d supply, fill #5
  Filled 2022-05-06: qty 60, 30d supply, fill #6
  Filled 2022-06-11: qty 60, 30d supply, fill #7
  Filled 2022-07-11: qty 60, 30d supply, fill #8
  Filled 2022-08-09: qty 60, 30d supply, fill #9
  Filled 2022-09-07: qty 60, 30d supply, fill #10
  Filled 2022-10-06: qty 60, 30d supply, fill #11

## 2021-10-21 NOTE — Patient Instructions (Addendum)
Medication Instructions:  Your physician has recommended you make the following change in your medication: Stop meloxicam *If you need a refill on your cardiac medications before your next appointment, please call your pharmacy*   Lab Work: Lab work to be done today--BMP, TSH, CBC and BNP If you have labs (blood work) drawn today and your tests are completely normal, you will receive your results only by: [place] (if you have MyChart) OR A paper copy in the mail If you have any lab test that is abnormal or we need to change your treatment, we will call you to review the results.   Testing/Procedures: Your physician has requested that you have an echocardiogram. Echocardiography is a painless test that uses sound waves to create images of your heart. It provides your doctor with information about the size and shape of your heart and how well your hearts chambers and valves are working. This procedure takes approximately one hour. There are no restrictions for this procedure.  Your physician has requested that you have cardiac CT. Cardiac computed tomography (CT) is a painless test that uses an x-ray machine to take clear, detailed pictures of your heart. For further information please visit [email or web address]. Please follow instruction sheet as given.     Follow-Up: At [facility], you and your health needs are our priority.  As part of our continuing mission to provide you with exceptional heart care, we have created designated Provider Care Teams.  These Care Teams include your primary Cardiologist (physician) and Advanced Practice Providers (APPs -  Physician Assistants and Nurse Practitioners) who all work together to provide you with the care you need, when you need it.  We recommend signing up for the patient portal called "MyChart".  Sign up information is provided on this After Visit Summary.  MyChart is used to connect with patients for Virtual Visits (Telemedicine).   Patients are able to view lab/test results, encounter notes, upcoming appointments, etc.  Non-urgent messages can be sent to your provider as well.   To learn more about what you can do with MyChart, go to [email or web address].    Your next appointment:   6 month(s)  The format for your next appointment:   In Person  Provider:   Larae Grooms, MD     Other Instructions   Your cardiac CT will be scheduled at one of the below locations:   [facility] [address] [place], [place] [address] [phone]  [place] [address] [place], [place] [address] [phone]  If scheduled at [facility], please arrive at the [facility] main entrance (entrance A) of [facility] 30 minutes prior to test start time. You can use the FREE valet parking offered at the main entrance (encouraged to control the heart rate for the test) Proceed to the [facility] Radiology Department (first floor) to check-in and test prep.  If scheduled at [facility], please arrive 15 mins early for check-in and test prep.  Please follow these instructions carefully (unless otherwise directed):    On the Night Before the Test: Be sure to Drink plenty of water. Do not consume any caffeinated/decaffeinated beverages or chocolate 12 hours prior to your test. Do not take any antihistamines 12 hours prior to your test.   On the Day of the Test: Drink plenty of water until 1 hour prior to the test. Do not eat any food 4 hours prior to  the test. You may take your regular medications prior to the test.  Take metoprolol (Lopressor) two hours prior to test. Dose is 50 mg HOLD Furosemide/Hydrochlorothiazide morning of the test. FEMALES- please wear underwire-free bra if available, avoid dresses & tight clothing         After the Test: Drink plenty of water. After receiving IV contrast, you  may experience a mild flushed feeling. This is normal. On occasion, you may experience a mild rash up to 24 hours after the test. This is not dangerous. If this occurs, you can take Benadryl 25 mg and increase your fluid intake. If you experience trouble breathing, this can be serious. If it is severe call 911 IMMEDIATELY. If it is mild, please call our office. If you take any of these medications: Glipizide/Metformin, Avandament, Glucavance, please do not take 48 hours after completing test unless otherwise instructed.  We will call to schedule your test 2-4 weeks out understanding that some insurance companies will need an authorization prior to the service being performed.   For non-scheduling related questions, please contact the cardiac imaging nurse navigator should you have any questions/concerns: Marchia Bond, Cardiac Imaging Nurse Navigator Gordy Clement, Cardiac Imaging Nurse Navigator [facility] Direct Office Dial: [phone]   For scheduling needs, including cancellations and rescheduling, please call [place], [phone].

## 2021-10-23 ENCOUNTER — Other Ambulatory Visit: Payer: Self-pay

## 2021-10-28 ENCOUNTER — Other Ambulatory Visit: Payer: Self-pay

## 2021-10-28 ENCOUNTER — Ambulatory Visit: Payer: Medicare Other | Admitting: Dermatology

## 2021-10-28 DIAGNOSIS — L578 Other skin changes due to chronic exposure to nonionizing radiation: Secondary | ICD-10-CM

## 2021-10-28 DIAGNOSIS — Z86007 Personal history of in-situ neoplasm of skin: Secondary | ICD-10-CM

## 2021-10-28 DIAGNOSIS — D229 Melanocytic nevi, unspecified: Secondary | ICD-10-CM

## 2021-10-28 DIAGNOSIS — L738 Other specified follicular disorders: Secondary | ICD-10-CM

## 2021-10-28 DIAGNOSIS — L821 Other seborrheic keratosis: Secondary | ICD-10-CM

## 2021-10-28 DIAGNOSIS — Z1283 Encounter for screening for malignant neoplasm of skin: Secondary | ICD-10-CM

## 2021-10-28 DIAGNOSIS — L814 Other melanin hyperpigmentation: Secondary | ICD-10-CM

## 2021-10-28 DIAGNOSIS — L570 Actinic keratosis: Secondary | ICD-10-CM

## 2021-10-28 DIAGNOSIS — D1800 Hemangioma unspecified site: Secondary | ICD-10-CM

## 2021-10-28 NOTE — Patient Instructions (Addendum)
Recommend taking Heliocare sun protection supplement daily in sunny weather for additional sun protection. For maximum protection on the sunniest days, you can take up to 2 capsules of regular Heliocare OR take 1 capsule of Heliocare Ultra. For prolonged exposure (such as a full day in the sun), you can repeat your dose of the supplement 4 hours after your first dose. Heliocare can be purchased at [employer], at some [facility] or at [email or web address].    Cryotherapy Aftercare  Wash gently with soap and water everyday.   Apply Vaseline and Band-Aid daily until healed.    Melanoma ABCDEs  Melanoma is the most dangerous type of skin cancer, and is the leading cause of death from skin disease.  You are more likely to develop melanoma if you: Have light-colored skin, light-colored eyes, or red or blond hair Spend a lot of time in the sun Tan regularly, either outdoors or in a tanning bed Have had blistering sunburns, especially during childhood Have a close family member who has had a melanoma Have atypical moles or large birthmarks  Early detection of melanoma is key since treatment is typically straightforward and cure rates are extremely high if we catch it early.   The first sign of melanoma is often a change in a mole or a new dark spot.  The ABCDE system is a way of remembering the signs of melanoma.  A for asymmetry:  The two halves do not match. B for border:  The edges of the growth are irregular. C for color:  A mixture of colors are present instead of an even brown color. D for diameter:  Melanomas are usually (but not always) greater than 11mm - the size of a pencil eraser. E for evolution:  The spot keeps changing in size, shape, and color.  Please check your skin once per month between visits. You can use a small mirror in front and a large mirror behind you to keep an eye on the back side or your body.   If you see any new or changing lesions before your next  follow-up, please call to schedule a visit.  Please continue daily skin protection including broad spectrum sunscreen SPF 30+ to sun-exposed areas, reapplying every 2 hours as needed when you're outdoors.    If You Need Anything After Your Visit  If you have any questions or concerns for your doctor, please call our main line at [phone] and press option 4 to reach your doctor's medical assistant. If no one answers, please leave a voicemail as directed and we will return your call as soon as possible. Messages left after 4 pm will be answered the following business day.   You may also send [place] a message via [place]. We typically respond to MyChart messages within 1-2 business days.  For prescription refills, please ask your pharmacy to contact our office. Our fax number is [phone].  If you have an urgent issue when the clinic is closed that cannot wait until the next business day, you can page your doctor at the number below.    Please note that while we do our best to be available for urgent issues outside of office hours, we are not available 24/7.   If you have an urgent issue and are unable to reach [place], you may choose to seek medical care at your doctor's office, retail clinic, urgent care center, or emergency room.  If you have a medical emergency, please immediately call 911 or go to  the emergency department.  Pager Numbers  - Dr. Nehemiah Massed: [phone]  - Dr. Laurence Ferrari: [phone]  - Dr. Nicole Kindred: [phone]  In the event of inclement weather, please call our main line at [phone] for an update on the status of any delays or closures.  Dermatology Medication Tips: Please keep the boxes that topical medications come in in order to help keep track of the instructions about where and how to use these. Pharmacies typically print the medication instructions only on the boxes and not directly on the medication tubes.   If your medication is too expensive, please contact  our office at [phone] option 4 or send [place] a message through [place].   We are unable to tell what your co-pay for medications will be in advance as this is different depending on your insurance coverage. However, we may be able to find a substitute medication at lower cost or fill out paperwork to get insurance to cover a needed medication.   If a prior authorization is required to get your medication covered by your insurance company, please allow [place] 1-2 business days to complete this process.  Drug prices often vary depending on where the prescription is filled and some pharmacies may offer cheaper prices.  The website www.goodrx.com contains coupons for medications through different pharmacies. The prices here do not account for what the cost may be with help from insurance (it may be cheaper with your insurance), but the website can give you the price if you did not use any insurance.  - You can print the associated coupon and take it with your prescription to the pharmacy.  - You may also stop by our office during regular business hours and pick up a GoodRx coupon card.  - If you need your prescription sent electronically to a different pharmacy, notify our office through [facility] or by phone at [phone] option 4.     Si Usted Necesita Algo Despus de Su Visita  Tambin puede enviarnos un mensaje a travs de Pharmacist, community. Por lo general respondemos a los mensajes de MyChart en el transcurso de 1 a 2 das hbiles.  Para renovar recetas, por favor pida a su farmacia que se ponga en contacto con nuestra oficina. Harland Dingwall de fax es [place] [phone].  Si tiene un asunto urgente cuando la clnica est cerrada y que no puede esperar hasta el siguiente da hbil, puede llamar/localizar a su doctor(a) al nmero que aparece a continuacin.   Por favor, tenga en cuenta que aunque hacemos todo lo posible para estar disponibles para asuntos urgentes fuera del horario de [place],  no estamos disponibles las 24 horas del da, los 7 das de la [place].   Si tiene un problema urgente y no puede comunicarse con nosotros, puede optar por buscar atencin mdica  en el consultorio de su doctor(a), en una clnica privada, en un centro de atencin urgente o en una sala de emergencias.  Si tiene Engineering geologist, por favor llame inmediatamente al 911 o vaya a la sala de emergencias.  Nmeros de bper  - Dr. Nehemiah Massed: [phone]  - Dra. Moye: [phone]  - Dra. Nicole Kindred: [phone]  En caso de inclemencias del [place], por favor llame a Johnsie Kindred principal al [phone] para una actualizacin sobre el [place] de cualquier retraso o cierre.  Consejos para la medicacin en dermatologa: Por favor, guarde las cajas en las que vienen los medicamentos de uso tpico para ayudarle a seguir las instrucciones sobre dnde y cmo usarlos.  Las farmacias generalmente imprimen las instrucciones del medicamento slo en las cajas y no directamente en los tubos del [place].   Si su medicamento es muy caro, por favor, pngase en contacto con Zigmund Daniel llamando al [phone] y presione la opcin 4 o envenos un mensaje a travs de Pharmacist, community.   No podemos decirle cul ser su copago por los medicamentos por adelantado ya que esto es diferente dependiendo de la cobertura de su seguro. Sin embargo, es posible que podamos encontrar un medicamento sustituto a Electrical engineer un formulario para que el seguro cubra el medicamento que se considera necesario.   Si se requiere una autorizacin previa para que su compaa de seguros [place] su medicamento, por favor permtanos de 1 a 2 das hbiles para completar este proceso.  Los precios de los medicamentos varan con frecuencia dependiendo del Environmental consultant de dnde se surte la receta y alguna farmacias pueden ofrecer precios ms baratos.  El sitio web www.goodrx.com tiene cupones para medicamentos de Airline pilot. Los precios  aqu no tienen en cuenta lo que podra costar con la ayuda del seguro (puede ser ms barato con su seguro), pero el sitio web puede darle el precio si no utiliz Research scientist (physical sciences).  - Puede imprimir el cupn correspondiente y llevarlo con su receta a la farmacia.  - Tambin puede pasar por nuestra oficina durante el horario de atencin regular y Charity fundraiser una tarjeta de cupones de GoodRx.  - Si necesita que su receta se enve electrnicamente a una farmacia diferente, informe a nuestra oficina a travs de MyChart de [place] o por telfono llamando al [phone] y presione la opcin 4.

## 2021-10-28 NOTE — Progress Notes (Signed)
? ?Follow-Up Visit ?  ?Subjective  ?Barbara Fletcher is a 71 y.o. female who presents for the following: FBSE (Patient here for full body skin exam and skin cancer screening. Patient with hx of SCC. Patient is not aware of any new or changing spots. ). ? ?Patient accompanied by husband. ? ?The following portions of the chart were reviewed this encounter and updated as appropriate:  ? Tobacco  Allergies  Meds  Problems  Med Hx  Surg Hx  Fam Hx   ?  ? ?Review of Systems:  No other skin or systemic complaints except as noted in HPI or Assessment and Plan. ? ?Objective  ?Well appearing patient in no apparent distress; mood and affect are within normal limits. ? ?A full examination was performed including scalp, head, eyes, ears, nose, lips, neck, chest, axillae, abdomen, back, buttocks, bilateral upper extremities, bilateral lower extremities, hands, feet, fingers, toes, fingernails, and toenails. All findings within normal limits unless otherwise noted below. ? ?left brow ?Erythematous thin papules/macules with gritty scale.  ? ?face ?Small yellow papules with a central dell. ? ? ? ? ?Assessment & Plan  ?AK (actinic keratosis) ?left brow ? ?Prior to procedure, discussed risks of blister formation, small wound, skin dyspigmentation, or rare scar following cryotherapy. Recommend Vaseline ointment to treated areas while healing. ? ? ?Actinic keratoses are precancerous spots that appear secondary to cumulative UV radiation exposure/sun exposure over time. They are chronic with expected duration over 1 year. A portion of actinic keratoses will progress to squamous cell carcinoma of the skin. It is not possible to reliably predict which spots will progress to skin cancer and so treatment is recommended to prevent development of skin cancer. ? ?Recommend daily broad spectrum sunscreen SPF 30+ to sun-exposed areas, reapply every 2 hours as needed.  ?Recommend staying in the shade or wearing long sleeves, sun glasses  (UVA+UVB protection) and wide brim hats (4-inch brim around the entire circumference of the hat). ?Call for new or changing lesions. ? ? ?Destruction of lesion - left brow ?Complexity: simple   ?Destruction method: cryotherapy   ?Informed consent: discussed and consent obtained   ?Lesion destroyed using liquid nitrogen: Yes   ?Region frozen until ice ball extended beyond lesion: Yes   ?Outcome: patient tolerated procedure well with no complications   ?Post-procedure details: wound care instructions given   ? ?Sebaceous hyperplasia ?face ? ?Benign-appearing.  Observation.  Call clinic for new or changing lesions.  ? ? ?Lentigines ?- Scattered tan macules ?- Due to sun exposure ?- Benign-appearing, observe ?- Recommend daily broad spectrum sunscreen SPF 30+ to sun-exposed areas, reapply every 2 hours as needed. ?- Call for any changes ? ?Seborrheic Keratoses ?- Stuck-on, waxy, tan-brown papules and/or plaques  ?- Benign-appearing ?- Discussed benign etiology and prognosis. ?- Observe ?- Call for any changes ? ?Melanocytic Nevi ?- Tan-brown and/or pink-flesh-colored symmetric macules and papules ?- Benign appearing on exam today ?- Observation ?- Call clinic for new or changing moles ?- Recommend daily use of broad spectrum spf 30+ sunscreen to sun-exposed areas.  ? ?Hemangiomas ?- Red papules ?- Discussed benign nature ?- Observe ?- Call for any changes ? ?Actinic Damage ?- Chronic condition, secondary to cumulative UV/sun exposure ?- diffuse scaly erythematous macules with underlying dyspigmentation ?- Recommend daily broad spectrum sunscreen SPF 30+ to sun-exposed areas, reapply every 2 hours as needed.  ?- Staying in the shade or wearing long sleeves, sun glasses (UVA+UVB protection) and wide brim hats (4-inch brim around the  entire circumference of the hat) are also recommended for sun protection.  ?- Call for new or changing lesions. ? ?Skin cancer screening performed today. ? ?History of Squamous Cell Carcinoma  in Situ of the Skin ?- No evidence of recurrence today at right third fingernail, tx'd with Mohs ?- Recommend regular full body skin exams ?- Recommend daily broad spectrum sunscreen SPF 30+ to sun-exposed areas, reapply every 2 hours as needed.  ?- Call if any new or changing lesions are noted between office visits  ? ?Return in about 1 year (around 10/29/2022) for TBSE. ? ?Graciella Belton, RMA, am acting as scribe for Forest Gleason, MD . ? ?Documentation: I have reviewed the above documentation for accuracy and completeness, and I agree with the above. ? ?Forest Gleason, MD ? ? ?

## 2021-10-29 LAB — BASIC METABOLIC PANEL
BUN/Creatinine Ratio: 19 (ref 12–28)
BUN: 26 mg/dL (ref 8–27)
CO2: 23 mmol/L (ref 20–29)
Calcium: 9.9 mg/dL (ref 8.7–10.3)
Chloride: 102 mmol/L (ref 96–106)
Creatinine, Ser: 1.39 mg/dL — ABNORMAL HIGH (ref 0.57–1.00)
Glucose: 103 mg/dL — ABNORMAL HIGH (ref 70–99)
Potassium: 4.4 mmol/L (ref 3.5–5.2)
Sodium: 140 mmol/L (ref 134–144)
eGFR: 41 mL/min/{1.73_m2} — ABNORMAL LOW (ref >59)

## 2021-10-29 LAB — CBC
Hematocrit: 38.9 % (ref 34.0–46.6)
Hemoglobin: 12.9 g/dL (ref 11.1–15.9)
MCH: 30.3 pg (ref 26.6–33.0)
MCHC: 33.2 g/dL (ref 31.5–35.7)
MCV: 91 fL (ref 79–97)
Platelets: 264 10*3/uL (ref 150–450)
RBC: 4.26 x10E6/uL (ref 3.77–5.28)
RDW: 13.1 % (ref 11.7–15.4)
WBC: 8.8 10*3/uL (ref 3.4–10.8)

## 2021-10-29 LAB — TSH: TSH: 2.7 u[IU]/mL (ref 0.450–4.500)

## 2021-10-29 LAB — PRO B NATRIURETIC PEPTIDE

## 2021-10-30 ENCOUNTER — Encounter: Payer: Self-pay | Admitting: Dermatology

## 2021-11-05 ENCOUNTER — Other Ambulatory Visit: Payer: Self-pay

## 2021-11-05 ENCOUNTER — Ambulatory Visit (HOSPITAL_COMMUNITY): Payer: Medicare Other | Attending: Cardiovascular Disease

## 2021-11-05 DIAGNOSIS — I480 Paroxysmal atrial fibrillation: Secondary | ICD-10-CM | POA: Diagnosis not present

## 2021-11-05 DIAGNOSIS — I10 Essential (primary) hypertension: Secondary | ICD-10-CM | POA: Insufficient documentation

## 2021-11-05 DIAGNOSIS — R0609 Other forms of dyspnea: Secondary | ICD-10-CM | POA: Insufficient documentation

## 2021-11-05 LAB — ECHOCARDIOGRAM COMPLETE
Area-P 1/2: 3.6 cm2
S' Lateral: 2.5 cm

## 2021-11-09 ENCOUNTER — Other Ambulatory Visit: Payer: Self-pay | Admitting: Family Medicine

## 2021-11-09 DIAGNOSIS — I10 Essential (primary) hypertension: Secondary | ICD-10-CM

## 2021-11-10 ENCOUNTER — Other Ambulatory Visit: Payer: Self-pay

## 2021-11-10 ENCOUNTER — Telehealth (HOSPITAL_COMMUNITY): Payer: Self-pay | Admitting: *Deleted

## 2021-11-10 MED ORDER — AMLODIPINE BESYLATE 5 MG PO TABS
ORAL_TABLET | Freq: Every day | ORAL | 0 refills | Status: DC
  Filled 2021-11-10: qty 90, 90d supply, fill #0

## 2021-11-10 NOTE — Telephone Encounter (Signed)
Reaching out to patient to offer assistance regarding upcoming cardiac imaging study; pt verbalizes understanding of appt date/time, parking situation and where to check in, pre-test NPO status and medications ordered, and verified current allergies; name and call back number provided for further questions should they arise ? ?Gordy Clement RN Navigator Cardiac Imaging ?[facility] ?[phone] office ?[phone] cell ? ?Patient to take her AM '50mg'$  metoprolol dose two hours prior to her cardiac CT Scan. She is aware to arrive at 8:30am for her 9am scan. ?

## 2021-11-10 NOTE — Telephone Encounter (Signed)
Requested Prescriptions  ?Pending Prescriptions Disp Refills  ?? amLODipine (NORVASC) 5 MG tablet 90 tablet 0  ?  Sig: TAKE 1 TABLET BY MOUTH DAILY  ?  ? Cardiovascular: Calcium Channel Blockers 2 Passed - 11/09/2021  8:15 PM  ?  ?  Passed - Last BP in normal range  ?  BP Readings from Last 1 Encounters:  ?10/21/21 118/70  ?   ?  ?  Passed - Last Heart Rate in normal range  ?  Pulse Readings from Last 1 Encounters:  ?10/21/21 (!) 59  ?   ?  ?  Passed - Valid encounter within last 6 months  ?  Recent Outpatient Visits   ?      ? 4 weeks ago Primary hypertension  ? [facility] Jerrol Banana., MD  ? 4 months ago Hyperglycemia  ? [facility] Jerrol Banana., MD  ? 1 year ago Primary hypertension  ? [facility] Jerrol Banana., MD  ? 2 years ago Palpitations  ? [facility] Jerrol Banana., MD  ? 3 years ago Essential hypertension  ? [facility] Jerrol Banana., MD  ?  ?  ?Future Appointments   ?        ? In 5 months Jerrol Banana., MD [facility]  ?  ? ?  ?  ?  ? ?

## 2021-11-11 ENCOUNTER — Other Ambulatory Visit: Payer: Self-pay

## 2021-11-11 ENCOUNTER — Telehealth: Payer: Self-pay | Admitting: *Deleted

## 2021-11-11 ENCOUNTER — Ambulatory Visit (HOSPITAL_COMMUNITY)
Admission: RE | Admit: 2021-11-11 | Discharge: 2021-11-11 | Disposition: A | Discharge status: Home / Self Care | Payer: Medicare Other | Source: Ambulatory Visit | Attending: Cardiology | Admitting: Cardiology

## 2021-11-11 ENCOUNTER — Other Ambulatory Visit (HOSPITAL_COMMUNITY): Payer: Self-pay | Admitting: Emergency Medicine

## 2021-11-11 ENCOUNTER — Other Ambulatory Visit: Payer: Self-pay | Admitting: Cardiology

## 2021-11-11 ENCOUNTER — Encounter (HOSPITAL_COMMUNITY): Payer: Self-pay

## 2021-11-11 ENCOUNTER — Ambulatory Visit (HOSPITAL_COMMUNITY)
Admission: RE | Admit: 2021-11-11 | Discharge: 2021-11-11 | Disposition: A | Discharge status: Home / Self Care | Payer: Medicare Other | Source: Ambulatory Visit | Attending: Interventional Cardiology | Admitting: Interventional Cardiology

## 2021-11-11 ENCOUNTER — Encounter: Payer: Self-pay | Admitting: *Deleted

## 2021-11-11 DIAGNOSIS — I10 Essential (primary) hypertension: Secondary | ICD-10-CM | POA: Diagnosis not present

## 2021-11-11 DIAGNOSIS — Z01812 Encounter for preprocedural laboratory examination: Secondary | ICD-10-CM

## 2021-11-11 DIAGNOSIS — R931 Abnormal findings on diagnostic imaging of heart and coronary circulation: Secondary | ICD-10-CM | POA: Insufficient documentation

## 2021-11-11 DIAGNOSIS — I700 Atherosclerosis of aorta: Secondary | ICD-10-CM | POA: Insufficient documentation

## 2021-11-11 DIAGNOSIS — I480 Paroxysmal atrial fibrillation: Secondary | ICD-10-CM | POA: Diagnosis not present

## 2021-11-11 DIAGNOSIS — R072 Precordial pain: Secondary | ICD-10-CM | POA: Diagnosis not present

## 2021-11-11 DIAGNOSIS — R0609 Other forms of dyspnea: Secondary | ICD-10-CM | POA: Diagnosis not present

## 2021-11-11 DIAGNOSIS — I2510 Atherosclerotic heart disease of native coronary artery without angina pectoris: Secondary | ICD-10-CM | POA: Insufficient documentation

## 2021-11-11 MED ORDER — IOHEXOL 350 MG/ML SOLN
90.0000 mL | Freq: Once | INTRAVENOUS | Status: AC | PRN
  Administered 2021-11-11: 90 mL via INTRAVENOUS

## 2021-11-11 MED ORDER — ROSUVASTATIN CALCIUM 20 MG PO TABS
20.0000 mg | ORAL_TABLET | Freq: Every day | ORAL | 3 refills | Status: DC
  Filled 2021-11-11: qty 90, 90d supply, fill #0

## 2021-11-11 MED ORDER — NITROGLYCERIN 0.4 MG SL SUBL
SUBLINGUAL_TABLET | SUBLINGUAL | Status: AC
  Administered 2021-11-11: 0.8 mg
  Filled 2021-11-11: qty 2

## 2021-11-11 MED ORDER — NITROGLYCERIN 0.4 MG SL SUBL
0.4000 mg | SUBLINGUAL_TABLET | SUBLINGUAL | 3 refills | Status: DC | PRN
  Filled 2021-11-11: qty 25, 5d supply, fill #0

## 2021-11-11 NOTE — Telephone Encounter (Signed)
-----   Message from Jettie Booze, MD sent at 11/11/2021  5:27 PM EDT ----- ?Can hold Eliquis 2 days before cath.  Start rosuvastatin 20 mg daily.  ?

## 2021-11-11 NOTE — Telephone Encounter (Signed)
I spoke with patient and reviewed Cardiac CT and echo results with her.  Patient would like to proceed with cath.  Cath scheduled for November 19, 2021 at 7:30.  I verbally went over cath instructions with her and copy of instructions sent through my chart. ?Patient will come in for lab work on November 12, 2021. ?Prescriptions for Rosuvastatin and SL NTG sent to [facility] pharmacy.  ?

## 2021-11-11 NOTE — Telephone Encounter (Signed)
-----   Message from Jettie Booze, MD sent at 11/11/2021  5:24 PM EDT ----- ?Looks like there is a stenosis in the LAD that warrants cath to confirm and possibly fix. She has been on two antianginals with persistent DOE while walking.  WOuld hold off on strenuous exercise until the cath. OK to call in SL NTG.  ?

## 2021-11-12 ENCOUNTER — Other Ambulatory Visit: Payer: Self-pay

## 2021-11-12 ENCOUNTER — Other Ambulatory Visit: Payer: Medicare Other

## 2021-11-12 DIAGNOSIS — R0609 Other forms of dyspnea: Secondary | ICD-10-CM | POA: Diagnosis not present

## 2021-11-12 DIAGNOSIS — Z01812 Encounter for preprocedural laboratory examination: Secondary | ICD-10-CM | POA: Diagnosis not present

## 2021-11-12 DIAGNOSIS — R072 Precordial pain: Secondary | ICD-10-CM | POA: Diagnosis not present

## 2021-11-12 LAB — CBC
Hematocrit: 37.1 % (ref 34.0–46.6)
Hemoglobin: 12.6 g/dL (ref 11.1–15.9)
MCH: 30.5 pg (ref 26.6–33.0)
MCHC: 34 g/dL (ref 31.5–35.7)
MCV: 90 fL (ref 79–97)
Platelets: 279 10*3/uL (ref 150–450)
RBC: 4.13 x10E6/uL (ref 3.77–5.28)
RDW: 13.1 % (ref 11.7–15.4)
WBC: 6.8 10*3/uL (ref 3.4–10.8)

## 2021-11-12 LAB — BASIC METABOLIC PANEL
BUN/Creatinine Ratio: 18 (ref 12–28)
BUN: 21 mg/dL (ref 8–27)
CO2: 27 mmol/L (ref 20–29)
Calcium: 9.8 mg/dL (ref 8.7–10.3)
Chloride: 102 mmol/L (ref 96–106)
Creatinine, Ser: 1.15 mg/dL — ABNORMAL HIGH (ref 0.57–1.00)
Glucose: 116 mg/dL — ABNORMAL HIGH (ref 70–99)
Potassium: 3.8 mmol/L (ref 3.5–5.2)
Sodium: 142 mmol/L (ref 134–144)
eGFR: 51 mL/min/{1.73_m2} — ABNORMAL LOW (ref >59)

## 2021-11-18 ENCOUNTER — Telehealth: Payer: Self-pay | Admitting: *Deleted

## 2021-11-18 NOTE — Telephone Encounter (Addendum)
Cardiac Catheterization scheduled at [facility] for: Thursday November 19, 2021 7:30 AM ?Arrival time and place: [place] Entrance A at: 5:30 AM ? ? ?No solid food after midnight prior to cath, clear liquids until 5 AM day of procedure. ? ?Medication instructions: ?-Hold: ? Eliquis-patient reports last dose AM 11/16/21-knows to continue to hold until post procedure ?Triamterene/HCT-day before and day of procedure-per protocol-GFR 51-pt already taken today.  ?-Except hold medications usual morning medications can be taken with sips of water including aspirin 81 mg. ? ?Confirmed patient has responsible adult to drive home post procedure and be with patient first 24 hours after arriving home. ? ?One visitor is allowed to stay in the waiting room during the time you are at the hospital for your procedure.  ? ?Patient reports no new symptoms concerning for COVID-19/no exposure to COVID-19 in the past 10 days. ? ?Reviewed procedure instructions with patient.  ? ?

## 2021-11-19 ENCOUNTER — Other Ambulatory Visit: Payer: Self-pay

## 2021-11-19 ENCOUNTER — Other Ambulatory Visit (HOSPITAL_COMMUNITY): Payer: Self-pay

## 2021-11-19 ENCOUNTER — Encounter (HOSPITAL_COMMUNITY)
Admission: RE | Disposition: A | Discharge status: Home / Self Care | Payer: Self-pay | Source: Home / Self Care | Attending: Interventional Cardiology

## 2021-11-19 ENCOUNTER — Ambulatory Visit (HOSPITAL_COMMUNITY)
Admission: RE | Admit: 2021-11-19 | Discharge: 2021-11-19 | Disposition: A | Discharge status: Home / Self Care | Payer: Medicare Other | Attending: Interventional Cardiology | Admitting: Interventional Cardiology

## 2021-11-19 DIAGNOSIS — E785 Hyperlipidemia, unspecified: Secondary | ICD-10-CM | POA: Diagnosis not present

## 2021-11-19 DIAGNOSIS — I480 Paroxysmal atrial fibrillation: Secondary | ICD-10-CM | POA: Diagnosis not present

## 2021-11-19 DIAGNOSIS — Z79899 Other long term (current) drug therapy: Secondary | ICD-10-CM | POA: Diagnosis not present

## 2021-11-19 DIAGNOSIS — R0609 Other forms of dyspnea: Secondary | ICD-10-CM | POA: Diagnosis not present

## 2021-11-19 DIAGNOSIS — Z6832 Body mass index (BMI) 32.0-32.9, adult: Secondary | ICD-10-CM | POA: Diagnosis not present

## 2021-11-19 DIAGNOSIS — I25118 Atherosclerotic heart disease of native coronary artery with other forms of angina pectoris: Secondary | ICD-10-CM | POA: Insufficient documentation

## 2021-11-19 DIAGNOSIS — I10 Essential (primary) hypertension: Secondary | ICD-10-CM | POA: Diagnosis not present

## 2021-11-19 DIAGNOSIS — Z955 Presence of coronary angioplasty implant and graft: Secondary | ICD-10-CM

## 2021-11-19 DIAGNOSIS — Z7901 Long term (current) use of anticoagulants: Secondary | ICD-10-CM | POA: Diagnosis not present

## 2021-11-19 DIAGNOSIS — Z791 Long term (current) use of non-steroidal anti-inflammatories (NSAID): Secondary | ICD-10-CM | POA: Insufficient documentation

## 2021-11-19 DIAGNOSIS — I2510 Atherosclerotic heart disease of native coronary artery without angina pectoris: Secondary | ICD-10-CM

## 2021-11-19 HISTORY — PX: CORONARY STENT INTERVENTION: CATH118234

## 2021-11-19 HISTORY — PX: LEFT HEART CATH AND CORONARY ANGIOGRAPHY: CATH118249

## 2021-11-19 LAB — POCT ACTIVATED CLOTTING TIME
Activated Clotting Time: 317 seconds
Activated Clotting Time: 293 seconds
Activated Clotting Time: 359 seconds

## 2021-11-19 SURGERY — LEFT HEART CATH AND CORONARY ANGIOGRAPHY
Anesthesia: LOCAL

## 2021-11-19 MED ORDER — CLOPIDOGREL BISULFATE 75 MG PO TABS: 75.0000 mg | ORAL_TABLET | Freq: Every day | ORAL | Status: DC

## 2021-11-19 MED ORDER — IOHEXOL 350 MG/ML SOLN
INTRAVENOUS | Status: DC | PRN
  Administered 2021-11-19: 175 mL

## 2021-11-19 MED ORDER — ACETAMINOPHEN 325 MG PO TABS: 650.0000 mg | ORAL_TABLET | ORAL | Status: DC | PRN

## 2021-11-19 MED ORDER — ASPIRIN 81 MG PO CHEW: 81.0000 mg | CHEWABLE_TABLET | Freq: Every day | ORAL | Status: DC

## 2021-11-19 MED ORDER — VERAPAMIL HCL 2.5 MG/ML IV SOLN
INTRAVENOUS | Status: DC | PRN
  Administered 2021-11-19: 10 mL via INTRA_ARTERIAL

## 2021-11-19 MED ORDER — SODIUM CHLORIDE 0.9% FLUSH: 3.0000 mL | Freq: Two times a day (BID) | INTRAVENOUS | Status: DC

## 2021-11-19 MED ORDER — ROSUVASTATIN CALCIUM 40 MG PO TABS
40.0000 mg | ORAL_TABLET | Freq: Every day | ORAL | 0 refills | Status: DC
  Filled 2021-11-19: qty 90, 90d supply, fill #0

## 2021-11-19 MED ORDER — MIDAZOLAM HCL 2 MG/2ML IJ SOLN
INTRAMUSCULAR | Status: AC
Start: 2021-11-19 — End: ?
  Filled 2021-11-19: qty 2

## 2021-11-19 MED ORDER — NITROGLYCERIN 1 MG/10 ML FOR IR/CATH LAB
INTRA_ARTERIAL | Status: AC
  Filled 2021-11-19: qty 10

## 2021-11-19 MED ORDER — HEPARIN (PORCINE) IN NACL 1000-0.9 UT/500ML-% IV SOLN
INTRAVENOUS | Status: AC
  Filled 2021-11-19: qty 500

## 2021-11-19 MED ORDER — HEPARIN SODIUM (PORCINE) 1000 UNIT/ML IJ SOLN
INTRAMUSCULAR | Status: AC
  Filled 2021-11-19: qty 10

## 2021-11-19 MED ORDER — FENTANYL CITRATE (PF) 100 MCG/2ML IJ SOLN
INTRAMUSCULAR | Status: AC
  Filled 2021-11-19: qty 2

## 2021-11-19 MED ORDER — CLOPIDOGREL BISULFATE 75 MG PO TABS
75.0000 mg | ORAL_TABLET | Freq: Every day | ORAL | 1 refills | Status: DC
  Filled 2021-11-19 – 2022-02-14 (×3): qty 90, 90d supply, fill #0

## 2021-11-19 MED ORDER — MIDAZOLAM HCL 2 MG/2ML IJ SOLN
INTRAMUSCULAR | Status: AC
  Filled 2021-11-19: qty 2

## 2021-11-19 MED ORDER — HEPARIN SODIUM (PORCINE) 1000 UNIT/ML IJ SOLN
INTRAMUSCULAR | Status: DC | PRN
  Administered 2021-11-19: 2000 [IU] via INTRAVENOUS
  Administered 2021-11-19: 3000 [IU] via INTRAVENOUS
  Administered 2021-11-19: 6000 [IU] via INTRAVENOUS
  Administered 2021-11-19: 4000 [IU] via INTRAVENOUS

## 2021-11-19 MED ORDER — HEPARIN (PORCINE) IN NACL 1000-0.9 UT/500ML-% IV SOLN
INTRAVENOUS | Status: AC
  Filled 2021-11-19: qty 1000

## 2021-11-19 MED ORDER — ASPIRIN 81 MG PO CHEW: 81.0000 mg | CHEWABLE_TABLET | ORAL | Status: AC

## 2021-11-19 MED ORDER — VERAPAMIL HCL 2.5 MG/ML IV SOLN
INTRAVENOUS | Status: AC
  Filled 2021-11-19: qty 2

## 2021-11-19 MED ORDER — SODIUM CHLORIDE 0.9 % IV SOLN: 250.0000 mL | INTRAVENOUS | Status: DC | PRN

## 2021-11-19 MED ORDER — FAMOTIDINE IN NACL 20-0.9 MG/50ML-% IV SOLN
INTRAVENOUS | Status: AC
  Filled 2021-11-19: qty 50

## 2021-11-19 MED ORDER — LABETALOL HCL 5 MG/ML IV SOLN: 10.0000 mg | INTRAVENOUS | Status: DC | PRN

## 2021-11-19 MED ORDER — SODIUM CHLORIDE 0.9 % IV SOLN: INTRAVENOUS | Status: AC

## 2021-11-19 MED ORDER — HYDRALAZINE HCL 20 MG/ML IJ SOLN: 10.0000 mg | INTRAMUSCULAR | Status: DC | PRN

## 2021-11-19 MED ORDER — ONDANSETRON HCL 4 MG/2ML IJ SOLN: 4.0000 mg | Freq: Four times a day (QID) | INTRAMUSCULAR | Status: DC | PRN

## 2021-11-19 MED ORDER — SODIUM CHLORIDE 0.9 % IV SOLN
250.0000 mL | INTRAVENOUS | Status: DC | PRN
  Administered 2021-11-19: 1000 mL via INTRAVENOUS

## 2021-11-19 MED ORDER — ASPIRIN 81 MG PO CHEW
81.0000 mg | CHEWABLE_TABLET | ORAL | 0 refills | Status: DC
  Filled 2021-11-19: qty 30, 30d supply, fill #0

## 2021-11-19 MED ORDER — SODIUM CHLORIDE 0.9% FLUSH: 3.0000 mL | INTRAVENOUS | Status: DC | PRN

## 2021-11-19 MED ORDER — LIDOCAINE HCL (PF) 1 % IJ SOLN
INTRAMUSCULAR | Status: DC | PRN
  Administered 2021-11-19: 2 mL via SUBCUTANEOUS

## 2021-11-19 MED ORDER — FAMOTIDINE IN NACL 20-0.9 MG/50ML-% IV SOLN
INTRAVENOUS | Status: DC | PRN
  Administered 2021-11-19: 20 mg via INTRAVENOUS

## 2021-11-19 MED ORDER — LIDOCAINE HCL (PF) 1 % IJ SOLN
INTRAMUSCULAR | Status: AC
  Filled 2021-11-19: qty 30

## 2021-11-19 MED ORDER — SODIUM CHLORIDE 0.9 % WEIGHT BASED INFUSION: 1.0000 mL/kg/h | INTRAVENOUS | Status: DC

## 2021-11-19 MED ORDER — CLOPIDOGREL BISULFATE 300 MG PO TABS
ORAL_TABLET | ORAL | Status: AC
  Filled 2021-11-19: qty 1

## 2021-11-19 MED ORDER — MIDAZOLAM HCL 2 MG/2ML IJ SOLN
INTRAMUSCULAR | Status: DC | PRN
  Administered 2021-11-19 (×2): 1 mg via INTRAVENOUS
  Administered 2021-11-19: 2 mg via INTRAVENOUS
  Administered 2021-11-19: 1 mg via INTRAVENOUS

## 2021-11-19 MED ORDER — NITROGLYCERIN 1 MG/10 ML FOR IR/CATH LAB
INTRA_ARTERIAL | Status: DC | PRN
  Administered 2021-11-19: 200 ug via INTRA_ARTERIAL
  Administered 2021-11-19 (×3): 200 ug via INTRACORONARY
  Administered 2021-11-19: 500 ug via INTRA_ARTERIAL

## 2021-11-19 MED ORDER — CLOPIDOGREL BISULFATE 300 MG PO TABS
ORAL_TABLET | ORAL | Status: DC | PRN
  Administered 2021-11-19: 600 mg via ORAL

## 2021-11-19 MED ORDER — SODIUM CHLORIDE 0.9 % WEIGHT BASED INFUSION: 3.0000 mL/kg/h | INTRAVENOUS | Status: AC

## 2021-11-19 MED ORDER — HEPARIN (PORCINE) IN NACL 1000-0.9 UT/500ML-% IV SOLN
INTRAVENOUS | Status: DC | PRN
  Administered 2021-11-19 (×3): 500 mL

## 2021-11-19 MED ORDER — FENTANYL CITRATE (PF) 100 MCG/2ML IJ SOLN
INTRAMUSCULAR | Status: DC | PRN
Start: 2021-11-19 — End: 2021-11-19
  Administered 2021-11-19 (×5): 25 ug via INTRAVENOUS

## 2021-11-19 SURGICAL SUPPLY — 31 items
BALLN SAPPHIRE 2.0X10 (BALLOONS) ×2
BALLN SAPPHIRE 2.0X12 (BALLOONS) ×2
BALLN SAPPHIRE ~~LOC~~ 3.25X15 (BALLOONS) ×1 IMPLANT
BALLN SCOREFLEX 2.50X10 (BALLOONS)
BALLN SCOREFLEX 2.50X15 (BALLOONS) ×2
BALLOON SAPPHIRE 2.0X10 (BALLOONS) IMPLANT
BALLOON SAPPHIRE 2.0X12 (BALLOONS) IMPLANT
BALLOON SCOREFLEX 2.50X10 (BALLOONS) IMPLANT
BALLOON SCOREFLEX 2.50X15 (BALLOONS) IMPLANT
CATH INFINITI 5 FR AR1 MOD (CATHETERS) ×1 IMPLANT
CATH INFINITI 5 FR JL3.5 (CATHETERS) ×1 IMPLANT
CATH INFINITI MULTIPACK ANG 4F (CATHETERS) ×1 IMPLANT
CATH LAUNCHER 6FR EBU 3 (CATHETERS) ×1 IMPLANT
CATH OPTICROSS HD (CATHETERS) ×1 IMPLANT
DEVICE RAD COMP TR BAND LRG (VASCULAR PRODUCTS) ×1 IMPLANT
GLIDESHEATH SLEND SS 6F .021 (SHEATH) ×1 IMPLANT
GUIDEWIRE INQWIRE 1.5J.035X260 (WIRE) IMPLANT
INQWIRE 1.5J .035X260CM (WIRE) ×2
KIT ENCORE 26 ADVANTAGE (KITS) ×1 IMPLANT
KIT HEART LEFT (KITS) ×2 IMPLANT
PACK CARDIAC CATHETERIZATION (CUSTOM PROCEDURE TRAY) ×2 IMPLANT
SHEATH PROBE COVER 6X72 (BAG) ×1 IMPLANT
SLED PULL BACK IVUS (MISCELLANEOUS) ×1 IMPLANT
STENT ONYX FRONTIER 3.0X30 (Permanent Stent) ×1 IMPLANT
TRANSDUCER W/STOPCOCK (MISCELLANEOUS) ×2 IMPLANT
TUBING CIL FLEX 10 FLL-RA (TUBING) ×2 IMPLANT
VALVE COPILOT STAT (MISCELLANEOUS) ×1 IMPLANT
WIRE ASAHI PROWATER 180CM (WIRE) ×1 IMPLANT
WIRE FIGHTER CROSSING 190CM (WIRE) ×1 IMPLANT
WIRE HI TORQ BMW 190CM (WIRE) ×1 IMPLANT
WIRE HI TORQ VERSACORE-J 145CM (WIRE) ×1 IMPLANT

## 2021-11-19 NOTE — Progress Notes (Signed)
Discussed with pt stent, restrictions, Plavix importance, diet, exercise, NTG and CRPII. Pt receptive. Will refer to [place].  ?[id number] ?Yves Dill CES, ACSM ?1:45 PM ? ?11/19/2021 ? ?

## 2021-11-19 NOTE — Interval H&P Note (Signed)
Cath Lab Visit (complete for each Cath Lab visit) ? ?Clinical Evaluation Leading to the Procedure:  ? ?ACS: No. ? ?Non-ACS:   ? ?Anginal Classification: CCS III ? ?Anti-ischemic medical therapy: Minimal Therapy (1 class of medications) ? ?Non-Invasive Test Results: High-risk stress test findings: cardiac mortality >3%/year ? ?Prior CABG: No previous CABG ? ? ?Severe LAD disease by CTA. Persistent DOE. Has had AFib intermittently. ? ? ?History and Physical Interval Note: ? ?11/19/2021 ?7:39 AM ? ?Barbara Fletcher  has presented today for surgery, with the diagnosis of shortness of breath, abnormal cardiac ct.  The various methods of treatment have been discussed with the patient and family. After consideration of risks, benefits and other options for treatment, the patient has consented to  Procedure(s): ?LEFT HEART CATH AND CORONARY ANGIOGRAPHY (N/A) as a surgical intervention.  The patient's history has been reviewed, patient examined, no change in status, stable for surgery.  I have reviewed the patient's chart and labs.  Questions were answered to the patient's satisfaction.   ? ? ?Larae Grooms ? ? ?

## 2021-11-19 NOTE — Discharge Summary (Addendum)
?Discharge Summary for Same Day PCI  ? ?Patient ID: Barbara Fletcher ?MRN: [id number]; DOB: [birth year] ? ?Admit date: 11/19/2021 ?Discharge date: 11/19/2021 ? ?Primary Care Provider: Jerrol Banana., MD  ?Primary Cardiologist: Larae Grooms, MD  ?Primary Electrophysiologist:  None  ? ?Discharge Diagnoses  ?  ?Principal Problem: ?  Coronary artery disease ?Active Problems: ?  Hyperlipidemia ? ? ? ?Diagnostic Studies/Procedures  ?  ?Cardiac Catheterization 11/19/2021: ? ? Prox Cx lesion is 30% stenosed. ?  Prox LAD lesion is 25% stenosed. ?  Mid LAD lesion is 80% stenosed.  A drug-eluting stent was successfully placed using a STENT ONYX FRONTIER 3.0X30, postdilated to 3.25 mm and optimized with intravascular ultrasound. ?  Post intervention, there is a 0% residual stenosis. ?  2nd Diag lesion is 99% stenosed.  Plaque shift into this diagonal after stenting of the LAD.  Transient loss of flow but TIMI-3 flow returned after balloon angioplasty.  Balloon angioplasty was performed using a BALLN SAPPHIRE 2.0X12. ?  Dist RCA lesion is 25% stenosed. ?  Post intervention, there is a 25% residual stenosis. ?  The left ventricular systolic function is normal. ?  LV end diastolic pressure is mildly elevated. ?  The left ventricular ejection fraction is 55-65% by visual estimate. ?  There is no aortic valve stenosis. ?  ?Continue aggressive secondary prevention.  She will need triple therapy for a month.  Stop aspirin after 1 month.  Clopidogrel and Eliquis for likely 6 months.  If she does well, could consider 12 months of clopidogrel.  If there were bleeding issues, would stop clopidogrel at 6 months and continue Eliquis alone. ?  ?Of note, significant tortuosity in the right subclavian which made torquing catheters difficult.  Short left main also makes selecting the LAD somewhat difficult.  We had to wire the circumflex, pull the guide catheter back and then wire the LAD.  If an emergent cath was needed, would  consider groin approach. ? ?Diagnostic ?Dominance: Right ?Intervention ? ? ?_____________ ?  ?History of Present Illness   ?  ?Barbara Fletcher is a 71 y.o. female with PMH of palpitations, paroxymal afib, HTN, HLD who was recently seen in the office with complaints of shortness of breath with walking up an incline.  ? ?Cardiac catheterization was arranged for further evaluation. ? ?Hospital Course  ?   ?The patient underwent cardiac cath as noted above with mLAD of 80% treated with PCI/DES x1 along with 2nd diag 99% stenosis with transient loss of flow but TIMI 3 flow after balloon angioplasty. Plan for DAPT with ASA/plavix/Eliquis for at one month then stop ASA and continue plavix/eliquis. The patient was seen by cardiac rehab while in short stay. There were no observed complications post cath. Radial cath site was re-evaluated prior to discharge and found to be stable without any complications. Instructions/precautions regarding cath site care were given prior to discharge. ? ?Barbara Fletcher was seen by Dr. Irish Lack and determined stable for discharge home. Follow up with our office has been arranged. Medications are listed below. Pertinent changes include addition of plavix and ASA. Did increase Crestor to '40mg'$  daily at discharge. ? ?_____________ ? ?Cath/PCI Registry Performance & Quality Measures: ?Aspirin prescribed? - Yes ?ADP Receptor Inhibitor (Plavix/Clopidogrel, Brilinta/Ticagrelor or Effient/Prasugrel) prescribed (includes medically managed patients)? - Yes ?High Intensity Statin (Lipitor 40-'80mg'$  or Crestor 20-'40mg'$ ) prescribed? - Yes ?For EF <40%, was ACEI/ARB prescribed? - Not Applicable (EF >/= 40%) ?For EF <40%, Aldosterone Antagonist (Spironolactone or Eplerenone)  prescribed? - Not Applicable (EF >/= 83%) ?Cardiac Rehab Phase II ordered (Included Medically managed Patients)? - Yes ? ?_____________ ? ? ?Discharge Vitals ?Blood pressure (!) 129/46, pulse (!) 59, temperature 98.4 ?F (36.9 ?C),  temperature source Oral, resp. rate 17, height '5\' 1"'$  (1.549 m), weight 82.6 kg, SpO2 94 %.  [name] Weights  ? 11/19/21 0539  ?Weight: 82.6 kg  ? ? ?Last Labs & Radiologic Studies  ?  ?CBC ?No results for input(s): WBC, NEUTROABS, HGB, HCT, MCV, PLT in the last 72 hours. ?Basic Metabolic Panel ?No results for input(s): NA, K, CL, CO2, GLUCOSE, BUN, CREATININE, CALCIUM, MG, PHOS in the last 72 hours. ?Liver Function Tests ?No results for input(s): AST, ALT, ALKPHOS, BILITOT, PROT, ALBUMIN in the last 72 hours. ?No results for input(s): LIPASE, AMYLASE in the last 72 hours. ?High Sensitivity Troponin:   ?No results for input(s): TROPONINIHS in the last 720 hours.  ?BNP ?Invalid input(s): POCBNP ?D-Dimer ?No results for input(s): DDIMER in the last 72 hours. ?Hemoglobin A1C ?No results for input(s): HGBA1C in the last 72 hours. ?Fasting Lipid Panel ?No results for input(s): CHOL, HDL, LDLCALC, TRIG, CHOLHDL, LDLDIRECT in the last 72 hours. ?Thyroid Function Tests ?No results for input(s): TSH, T4TOTAL, T3FREE, THYROIDAB in the last 72 hours. ? ?Invalid input(s): FREET3 ?_____________  ?CARDIAC CATHETERIZATION ? ?Result Date: 11/19/2021 ?  Prox Cx lesion is 30% stenosed.   Prox LAD lesion is 25% stenosed.   Mid LAD lesion is 80% stenosed.  A drug-eluting stent was successfully placed using a STENT ONYX FRONTIER 3.0X30, postdilated to 3.25 mm and optimized with intravascular ultrasound.   Post intervention, there is a 0% residual stenosis.   2nd Diag lesion is 99% stenosed.  Plaque shift into this diagonal after stenting of the LAD.  Transient loss of flow but TIMI-3 flow returned after balloon angioplasty.  Balloon angioplasty was performed using a BALLN SAPPHIRE 2.0X12.   Dist RCA lesion is 25% stenosed.   Post intervention, there is a 25% residual stenosis.   The left ventricular systolic function is normal.   LV end diastolic pressure is mildly elevated.   The left ventricular ejection fraction is 55-65% by visual  estimate.   There is no aortic valve stenosis. Continue aggressive secondary prevention.  She will need triple therapy for a month.  Stop aspirin after 1 month.  Clopidogrel and Eliquis for likely 6 months.  If she does well, could consider 12 months of clopidogrel.  If there were bleeding issues, would stop clopidogrel at 6 months and continue Eliquis alone. Of note, significant tortuosity in the right subclavian which made torquing catheters difficult.  Short left main also makes selecting the LAD somewhat difficult.  We had to wire the circumflex, pull the guide catheter back and then wire the LAD.  If an emergent cath was needed, would consider groin approach.  ? ?CT CORONARY MORPH W/CTA COR W/SCORE W/CA W/CM &/OR WO/CM ? ?Addendum Date: 11/11/2021   ?ADDENDUM REPORT: 11/11/2021 12:09 EXAM: OVER-READ INTERPRETATION  CT CHEST The following report is an over-read performed by radiologist Dr. Barnetta Hammersmith [facility] on 11/11/2021. This over-read does not include interpretation of cardiac or coronary anatomy or pathology. The coronary CTA interpretation by the cardiologist is attached. COMPARISON:  None. FINDINGS: Atherosclerosis of the thoracic aorta. Visualized mediastinum and hilar regions demonstrate no lymphadenopathy or masses. Visualized lungs show no evidence of pulmonary edema, consolidation, pneumothorax, nodule or pleural fluid. Visualized upper abdomen and bony structures are unremarkable.  IMPRESSION: Atherosclerosis of the thoracic aorta. Electronically Signed   By: Aletta Edouard M.D.   On: 11/11/2021 12:09  ? ?Result Date: 11/11/2021 ?CLINICAL DATA:  71 yo female with dyspnea EXAM: Cardiac/Coronary CTA TECHNIQUE: A non-contrast, gated CT scan was obtained with axial slices of 3 mm through the heart for calcium scoring. Calcium scoring was performed using the Agatston method. A 120 kV prospective, gated, contrast cardiac scan was obtained. Gantry rotation speed was 250 msecs and  collimation was 0.6 mm. Two sublingual nitroglycerin tablets (0.8 mg) were given. The 3D data set was reconstructed in 5% intervals of the 35-75% of the R-R cycle. Diastolic phases were analyzed on a dedicated workstation

## 2021-11-20 ENCOUNTER — Encounter (HOSPITAL_COMMUNITY): Payer: Self-pay | Admitting: Interventional Cardiology

## 2021-11-25 ENCOUNTER — Other Ambulatory Visit: Payer: Self-pay

## 2021-11-25 ENCOUNTER — Telehealth (HOSPITAL_COMMUNITY): Payer: Self-pay

## 2021-11-25 ENCOUNTER — Other Ambulatory Visit (HOSPITAL_COMMUNITY): Payer: Self-pay

## 2021-11-25 NOTE — Telephone Encounter (Signed)
Pharmacy Transitions of Care Follow-up Telephone Call ? ?Date of discharge: 11/19/21  ?Discharge Diagnosis: stent placement ? ?How have you been since you were released from the hospital? Patient doing well since discharge, no questions about meds at this time.  ? ?Medication changes made at discharge: ?    START taking: ?aspirin  ?clopidogrel (Plavix)  ?CHANGE how you take: ?rosuvastatin (CRESTOR)  ? ?Medication changes verified by the patient? Yes ?  ? ?Medication Accessibility: ? ?Home Pharmacy: [facility]  ? ?Was the patient provided with refills on discharged medications? Yes  ? ?Have all prescriptions been transferred from [facility] to home pharmacy? Yes  ? ?Is the patient able to afford medications? Has insurance ?  ? ?Medication Review: ? ?CLOPIDOGREL (PLAVIX) ?Clopidogrel 75 mg once daily.  ?- Advised patient of medications to avoid (NSAIDs, ASA)  ?- Educated that Tylenol (acetaminophen) will be the preferred analgesic to prevent risk of bleeding  ?- Emphasized importance of monitoring for signs and symptoms of bleeding (abnormal bruising, prolonged bleeding, nose bleeds, bleeding from gums, discolored urine, black tarry stools)  ?- Advised patient to alert all providers of anticoagulation therapy prior to starting a new medication or having a procedure  ? ?Follow-up Appointments: ? ?PCP Hospital f/u appt confirmed? Scheduled to see Dr. Rosanna Randy on 04/14/22 @ 10AM.  ? ?[facility] f/u appt confirmed? Scheduled to see Dr. Ann Maki on 12/04/21 @ 8:45AM.  ? ?If their condition worsens, is the pt aware to call PCP or go to the Emergency Dept.? Yes ? ?Final Patient Assessment: ?Patient has f/u and refills at home pharmacy ? ?

## 2021-11-26 ENCOUNTER — Telehealth (HOSPITAL_COMMUNITY): Payer: Self-pay

## 2021-11-26 NOTE — Telephone Encounter (Signed)
Called patient to see if she is interested in the Cardiac Rehab Program. Patient expressed interest. Explained scheduling process and went over insurance, patient verbalized understanding. Will contact patient for scheduling once f/u has been completed. 

## 2021-11-26 NOTE — Telephone Encounter (Signed)
Pt insurance is active and benefits verified through [facility] Co-pay 0, DED 0/0 met, out of pocket $3,950/$575.63 met, co-insurance 0%. no pre-authorization required, Art/BCBS 11/26/2021@8 :42am, REF# [id number] ?  ?Will contact patient to see if she is interested in the Cardiac Rehab Program. If interested, patient will need to complete follow up appt. Once completed, patient will be contacted for scheduling upon review by the RN Navigator. ?

## 2021-11-27 ENCOUNTER — Other Ambulatory Visit: Payer: Self-pay

## 2021-11-30 ENCOUNTER — Other Ambulatory Visit: Payer: Self-pay

## 2021-11-30 ENCOUNTER — Telehealth: Payer: Self-pay

## 2021-11-30 NOTE — Telephone Encounter (Signed)
Copied from [place] [phone]. Topic: General - Other ?>> Nov 30, 2021  3:38 PM Valere Dross wrote: ?Reason for CRM: Karena Addison from [facility] [phone], called in wanting to if a surgical clearance was received, she is faxing over today, and requested a call back once received, please advise. ?

## 2021-11-30 NOTE — Telephone Encounter (Signed)
Received surgical clearance. Placed in providers box for review.  ?

## 2021-12-02 NOTE — Telephone Encounter (Signed)
Dr. Ky Barban signed form and it was faxed back. ?

## 2021-12-02 NOTE — Progress Notes (Signed)
?Cardiology Office Note:   ? ?Date:  12/04/2021  ? ?Barbara Fletcher, DOB 1950-11-13, MRN [id number] ? ?PCP:  Jerrol Banana., MD ?  ?[facility] ?Cardiologist:  Larae Grooms, MD    ? ?Referring MD: Jerrol Banana.,*  ? ?Chief Complaint: hospital follow-up CAD s/p DES ? ?History of Present Illness:   ? ?Barbara Fletcher is a very pleasant 71 y.o. female with a hx of CAD s/p DES to LAD, atrial fibrillation, hyperlipidemia, hypertension, and obesity.  ? ?She established care with Dr. Irish Lack in April 2021 for palpitations that woke Barbara Fletcher up in the middle of the night with an irregular feeling pulse up to the 120s.  Blood pressure had been running high.  Episodes of palpitations were getting more frequent.  Cardiac monitor 12/2019 revealed normal sinus rhythm with rare PACs and PVCs which were asymptomatic, short run of PACs less than 3 seconds asymptomatic, no sustained arrhythmias, no a fib.  Patient provided cardia monitoring strips which revealed paroxysmal atrial fibrillation.  She was started on Eliquis for anticoagulation and metoprolol was increased.  ? ?She was last seen in our office on 10/21/2021 by Dr. Irish Lack.  She reported that palpitations had improved on an additional dose of 25 mg of metoprolol at lunchtime. Echo revealed normal LVEF 55-60%, normal RV function, no significant valve dysfunction. Coronary CTA was ordered for dyspnea on exertion which revealed severe stenosis in the mid LAD with FFR suggesting significant obstruction.   ? ?She underwent cardiac catheterization on 11/19/2021 which revealed 80% mid LAD lesion, DES successfully placed, proximal Cx lesion 30% stenosed, proximal LAD lesion 25% stenosed, second diagonal lesion 99% stenosed, plaque shift into this diagonal after stenting of the LAD, transient loss of flow but TIMI III flow returned after balloon angioplasty.  Distal RCA lesion is 25% stenosed, LVEF 55 to 65%, mildly elevated LVEDP, no aortic  valve stenosis. Triple therapy x 1 month, stop aspirin after one month, clopidogrel and Eliquis likely for 6 months, if she does well could consider 12 months of clopidogrel. If bleeding issues would stop clopidogrel at 6 mo and continue Eliquis alone.  She was discharged home without complication and Crestor was increased to 40 mg daily. ? ?Today, she is here with Barbara Fletcher husband for follow-up.  She reports she is feeling well with some symptoms of palpitations 5 days ago that resolved within a few hours.  She states these were not intense and thought to be related to A-fib. She denies chest pain, shortness of breath, fatigue, melena, diaphoresis, weakness, presyncope, syncope, orthopnea, and PND. Mild nosebleed a few days ago, easy to get to stop.  Walked 1 mile yesterday with no concerns for activity intolerance, chest pain or dyspnea.  Home SBP 120s mmHg, DBP 60s mmHg. Trying to reduce caffeine consumption and follow a low-sodium, heart healthy diet.  She is interested in cardiac rehab.  ? ?Past Medical History:  ?Diagnosis Date  ? Arthritis   ? Cervical radiculopathy   ? Chronic bilateral low back pain without sciatica   ? Class 2 severe obesity due to excess calories with serious comorbidity and body mass index (BMI) of 35.0 to 35.9 in adult [facility])   ? Diverticulitis   ? Frequent headaches   ? Headaches, cluster   ? Hyperlipidemia   ? Hypertension   ? Osteoporosis without current pathological fracture   ? Palpitations   ? Squamous cell carcinoma of skin 11/24/2017  ? SCCIS, right third fingernail -  tx with MOHs  ? ? ?Past Surgical History:  ?Procedure Laterality Date  ? ABDOMINAL HYSTERECTOMY  1990  ? BACK SURGERY    ? lumbar fussion  ? [place]  ? CHOLECYSTECTOMY  1986  ? COLOSTOMY    ? COLOSTOMY CLOSURE    ? abd diverticulitis with colostomy, reversal 2007- 2008  ? CORONARY STENT INTERVENTION N/A 11/19/2021  ? Procedure: CORONARY STENT INTERVENTION;  Surgeon: Jettie Booze, MD;   Location: [facility];  Service: Cardiovascular;  Laterality: N/A;  ? LAPAROTOMY  02/13/2012  ? Procedure: EXPLORATORY LAPAROTOMY;  Surgeon: Rolm Bookbinder, MD;  Location: [facility];  Service: General;  Laterality: N/A;  Exploratory Laparotomy with small bowel resection, lysis of adhension and primary ventral hernia repair  ? LEFT HEART CATH AND CORONARY ANGIOGRAPHY N/A 11/19/2021  ? Procedure: LEFT HEART CATH AND CORONARY ANGIOGRAPHY;  Surgeon: Jettie Booze, MD;  Location: [facility];  Service: Cardiovascular;  Laterality: N/A;  ? OOPHORECTOMY    ? 1981  ? TONSILLECTOMY    ? 1958  ? ? ?Current Medications: ?Current Meds  ?Medication Sig  ? acetaminophen (TYLENOL) 500 MG tablet Take 1,000 mg by mouth every 6 (six) hours as needed for mild pain.  ? alendronate (FOSAMAX) 70 MG tablet Take 1 tablet (70 mg total) by mouth once a week. Take with a full glass of water on an empty stomach. (Patient taking differently: Take 70 mg by mouth once a week. Take with a full glass of water on an empty stomach. Sunday)  ? amLODipine (NORVASC) 5 MG tablet TAKE 1 TABLET BY MOUTH DAILY (Patient taking differently: Take 5 mg by mouth daily.)  ? apixaban (ELIQUIS) 5 MG TABS tablet Take 1 tablet (5 mg total) by mouth 2 (two) times daily.  ? ascorbic acid (VITAMIN C) 500 MG tablet Take 500 mg by mouth daily.  ? aspirin EC 81 MG tablet Take 81 mg by mouth daily. STOP 12/21/21  ? carboxymethylcellulose (REFRESH PLUS) 0.5 % SOLN Place 1 drop into both eyes daily as needed (dry eyes).  ? clopidogrel (PLAVIX) 75 MG tablet Take 1 tablet (75 mg total) by mouth daily.  ? famotidine (PEPCID) 10 MG tablet Take 10 mg by mouth daily.  ? fexofenadine-pseudoephedrine (ALLEGRA-D 24) 180-240 MG per 24 hr tablet Take 1 tablet by mouth daily as needed (allergies). Seasonal allergies  ? FIBER ADULT GUMMIES PO Take 2 tablets by mouth daily.  ? hydrochlorothiazide (HYDRODIURIL) 25 MG tablet One half tablet (12.5 mg ) or one tablet (25 mg)  as needed for BP/swelling.  ? Lidocaine 4 % PTCH Apply 1 patch topically daily as needed (pain).  ? metoprolol tartrate (LOPRESSOR) 50 MG tablet Take 1 tablet (50 mg) every morning, 1/2 tablet (25 mg) at lunch time, and 1 tablet (50 mg) in the evening (Patient taking differently: Take 25-50 mg by mouth See admin instructions. Take 1 tablet (50 mg) every morning, 1/2 tablet (25 mg) at lunch time, and 1 tablet (50 mg) in the evening)  ? nitroGLYCERIN (NITROSTAT) 0.4 MG SL tablet Place 1 tablet (0.4 mg total) under the tongue every 5 (five) minutes as needed for chest pain.  ? Omega-3 Fatty Acids (FISH OIL) 1200 MG CAPS Take 1,200 mg by mouth in the morning.  ? pregabalin (LYRICA) 75 MG capsule TAKE 1 CAPSULE BY MOUTH TWO TIMES DAILY (Patient taking differently: Take 75 mg by mouth at bedtime.)  ? PRESCRIPTION MEDICATION Apply 1 application. topically  daily as needed (rash). Hydrocortisone 2% equal part with Ketoconazole cream 2 % (mix together equal parts)  ? rosuvastatin (CRESTOR) 40 MG tablet Take 1 tablet (40 mg total) by mouth daily.  ? Vitamin D, Ergocalciferol, 2000 units CAPS Take 2,000 Units by mouth daily.  ? [DISCONTINUED] aspirin 81 MG chewable tablet Chew 1 tablet (81 mg total) by mouth before cath procedure.  ? [DISCONTINUED] triamterene-hydrochlorothiazide (MAXZIDE) 75-50 MG tablet Take 0.5 tablets by mouth daily. Pt. Takes 1/2 tablet once a day.  ?  ? ?Allergies:   Levsin [hyoscyamine sulfate], Levsin [hyoscyamine], and Lisinopril  ? ?Social History  ? ?Socioeconomic History  ? Marital status: Married  ?  Spouse name: Not on file  ? Number of children: Not on file  ? Years of education: Not on file  ? Highest education level: Not on file  ?Occupational History  ? Not on file  ?Tobacco Use  ? Smoking status: Never  ? Smokeless tobacco: Never  ?Vaping Use  ? Vaping Use: Never used  ?Substance and Sexual Activity  ? Alcohol use: Yes  ?  Alcohol/week: 1.0 standard drink  ?  Types: 1 Glasses of wine per week   ?  Comment: occ  ? Drug use: No  ? Sexual activity: Not on file  ?Other Topics Concern  ? Not on file  ?Social History Narrative  ? Not on file  ? ?Social Determinants of Health  ? ?Financial Resource Strain: No

## 2021-12-04 ENCOUNTER — Other Ambulatory Visit: Payer: Self-pay

## 2021-12-04 ENCOUNTER — Encounter: Payer: Self-pay | Admitting: Nurse Practitioner

## 2021-12-04 ENCOUNTER — Ambulatory Visit: Payer: Medicare Other | Admitting: Nurse Practitioner

## 2021-12-04 VITALS — BP 118/62 | HR 66 | Ht 61.0 in | Wt 186.8 lb

## 2021-12-04 DIAGNOSIS — I10 Essential (primary) hypertension: Secondary | ICD-10-CM

## 2021-12-04 DIAGNOSIS — I480 Paroxysmal atrial fibrillation: Secondary | ICD-10-CM | POA: Diagnosis not present

## 2021-12-04 DIAGNOSIS — R002 Palpitations: Secondary | ICD-10-CM

## 2021-12-04 DIAGNOSIS — I2510 Atherosclerotic heart disease of native coronary artery without angina pectoris: Secondary | ICD-10-CM

## 2021-12-04 DIAGNOSIS — R0609 Other forms of dyspnea: Secondary | ICD-10-CM

## 2021-12-04 DIAGNOSIS — E785 Hyperlipidemia, unspecified: Secondary | ICD-10-CM

## 2021-12-04 DIAGNOSIS — Z955 Presence of coronary angioplasty implant and graft: Secondary | ICD-10-CM | POA: Diagnosis not present

## 2021-12-04 LAB — BASIC METABOLIC PANEL
BUN/Creatinine Ratio: 16 (ref 12–28)
BUN: 16 mg/dL (ref 8–27)
CO2: 23 mmol/L (ref 20–29)
Calcium: 10 mg/dL (ref 8.7–10.3)
Chloride: 107 mmol/L — ABNORMAL HIGH (ref 96–106)
Creatinine, Ser: 1 mg/dL (ref 0.57–1.00)
Glucose: 105 mg/dL — ABNORMAL HIGH (ref 70–99)
Potassium: 4.4 mmol/L (ref 3.5–5.2)
Sodium: 148 mmol/L — ABNORMAL HIGH (ref 134–144)
eGFR: 61 mL/min/{1.73_m2} (ref >59)

## 2021-12-04 MED ORDER — HYDROCHLOROTHIAZIDE 25 MG PO TABS
ORAL_TABLET | ORAL | 9 refills | Status: DC
  Filled 2021-12-04: qty 30, 30d supply, fill #0

## 2021-12-04 NOTE — Patient Instructions (Signed)
Medication Instructions:  ? ?DISCONTINUE Triamterene ? ?START HCTZ one half tablet ( 12.5 mg ) or one tablet ( 25 mg) as needed for blood pressure or swelling.  ? ?*If you need a refill on your cardiac medications before your next appointment, please call your pharmacy* ? ? ?Lab Work: ? ?TODAY!!!!! BMET ? ?Your physician recommends that you return for a FASTING lipid profile/lft on Wednesday, May 17. You can come in on the day of your appointment anytime between 7:30-4:30 fasting from midnight the night before.  ? ? ? ?If you have labs (blood work) drawn today and your tests are completely normal, you will receive your results only by: ?MyChart Message (if you have MyChart) OR ?A paper copy in the mail ?If you have any lab test that is abnormal or we need to change your treatment, we will call you to review the results. ? ? ?Follow-Up: ?At [facility], you and your health needs are our priority.  As part of our continuing mission to provide you with exceptional heart care, we have created designated Provider Care Teams.  These Care Teams include your primary Cardiologist (physician) and Advanced Practice Providers (APPs -  Physician Assistants and Nurse Practitioners) who all work together to provide you with the care you need, when you need it. ? ?We recommend signing up for the patient portal called "MyChart".  Sign up information is provided on this After Visit Summary.  MyChart is used to connect with patients for Virtual Visits (Telemedicine).  Patients are able to view lab/test results, encounter notes, upcoming appointments, etc.  Non-urgent messages can be sent to your provider as well.   ?To learn more about what you can do with MyChart, go to [email or web address].   ? ?Your next appointment:   ?4 month(s) ? ?The format for your next appointment:   ?In Person ? ?Provider:   ?Larae Grooms, MD   ? ? ?

## 2021-12-16 ENCOUNTER — Other Ambulatory Visit: Payer: Self-pay

## 2021-12-16 DIAGNOSIS — H5203 Hypermetropia, bilateral: Secondary | ICD-10-CM | POA: Diagnosis not present

## 2021-12-16 MED FILL — Pregabalin Cap 75 MG: ORAL | 30 days supply | Qty: 60 | Fill #3 | Status: AC

## 2021-12-30 ENCOUNTER — Other Ambulatory Visit: Payer: Self-pay

## 2022-01-13 ENCOUNTER — Other Ambulatory Visit: Payer: Medicare Other

## 2022-01-13 DIAGNOSIS — Z955 Presence of coronary angioplasty implant and graft: Secondary | ICD-10-CM | POA: Diagnosis not present

## 2022-01-13 DIAGNOSIS — I480 Paroxysmal atrial fibrillation: Secondary | ICD-10-CM | POA: Diagnosis not present

## 2022-01-13 DIAGNOSIS — R0609 Other forms of dyspnea: Secondary | ICD-10-CM

## 2022-01-13 DIAGNOSIS — I10 Essential (primary) hypertension: Secondary | ICD-10-CM | POA: Diagnosis not present

## 2022-01-13 DIAGNOSIS — R002 Palpitations: Secondary | ICD-10-CM

## 2022-01-13 LAB — HEPATIC FUNCTION PANEL
ALT: 18 IU/L (ref 0–32)
AST: 26 IU/L (ref 0–40)
Albumin: 4 g/dL (ref 3.8–4.8)
Alkaline Phosphatase: 59 IU/L (ref 44–121)
Bilirubin Total: 0.3 mg/dL (ref 0.0–1.2)
Bilirubin, Direct: 0.13 mg/dL (ref 0.00–0.40)
Total Protein: 6.6 g/dL (ref 6.0–8.5)

## 2022-01-13 LAB — LIPID PANEL
Chol/HDL Ratio: 1.8 ratio (ref 0.0–4.4)
Cholesterol, Total: 99 mg/dL — ABNORMAL LOW (ref 100–199)
HDL: 56 mg/dL (ref >39)
LDL Chol Calc (NIH): 25 mg/dL (ref 0–99)
Triglycerides: 96 mg/dL (ref 0–149)
VLDL Cholesterol Cal: 18 mg/dL (ref 5–40)

## 2022-01-21 ENCOUNTER — Telehealth (HOSPITAL_COMMUNITY): Payer: Self-pay

## 2022-01-21 ENCOUNTER — Other Ambulatory Visit: Payer: Self-pay

## 2022-01-21 NOTE — Telephone Encounter (Signed)
Pt insurance is active and benefits verified through [facility] Co-pay 0, DED 0/0 met, out of pocket $3,950/$651.23met, co-insurance 0%. no pre-authorization required. Passport, [employer] 01/21/2022@4 :10pm, REF# [id number]

## 2022-01-27 ENCOUNTER — Other Ambulatory Visit: Payer: Self-pay

## 2022-01-28 ENCOUNTER — Encounter (HOSPITAL_COMMUNITY): Payer: Self-pay

## 2022-01-28 ENCOUNTER — Telehealth (HOSPITAL_COMMUNITY): Payer: Self-pay

## 2022-01-28 NOTE — Telephone Encounter (Signed)
Attempted to call patient in regards to Cardiac Rehab - LM on VM Mailed letter 

## 2022-02-08 ENCOUNTER — Other Ambulatory Visit: Payer: Self-pay

## 2022-02-08 ENCOUNTER — Other Ambulatory Visit: Payer: Self-pay | Admitting: Family Medicine

## 2022-02-08 DIAGNOSIS — I10 Essential (primary) hypertension: Secondary | ICD-10-CM

## 2022-02-08 MED ORDER — AMLODIPINE BESYLATE 5 MG PO TABS
ORAL_TABLET | Freq: Every day | ORAL | 3 refills | Status: DC
  Filled 2022-02-08: qty 90, 90d supply, fill #0
  Filled 2022-05-07: qty 90, 90d supply, fill #1
  Filled 2022-08-08: qty 90, 90d supply, fill #2
  Filled 2022-11-08 – 2022-11-09 (×2): qty 90, 90d supply, fill #3

## 2022-02-11 ENCOUNTER — Encounter (HOSPITAL_COMMUNITY): Payer: Self-pay

## 2022-02-11 ENCOUNTER — Encounter (HOSPITAL_COMMUNITY)
Admission: RE | Admit: 2022-02-11 | Discharge: 2022-02-11 | Disposition: A | Discharge status: Home / Self Care | Payer: Medicare Other | Source: Ambulatory Visit | Attending: Interventional Cardiology | Admitting: Interventional Cardiology

## 2022-02-11 VITALS — BP 124/70 | HR 51 | Ht 60.0 in | Wt 181.2 lb

## 2022-02-11 DIAGNOSIS — Z955 Presence of coronary angioplasty implant and graft: Secondary | ICD-10-CM | POA: Insufficient documentation

## 2022-02-11 HISTORY — DX: Atherosclerotic heart disease of native coronary artery without angina pectoris: I25.10

## 2022-02-11 NOTE — Progress Notes (Addendum)
Cardiac Individual Treatment Plan  Patient Details  Name: Barbara Fletcher MRN: [id number] Date of Birth: 1951-08-04 Referring Provider:   Flowsheet Row INTENSIVE CARDIAC REHAB ORIENT from 02/11/2022 in [facility]  Referring Provider Larae Grooms, MD       Initial Encounter Date:  [place] from 02/11/2022 in [facility]  Date 02/11/22       Visit Diagnosis: 11/19/21 S/P DES LAD, PTCA 1st diagonal   Patient's Home Medications on Admission:  Current Outpatient Medications:    acetaminophen (TYLENOL) 500 MG tablet, Take 1,000 mg by mouth every 6 (six) hours as needed (Arthritis)., Disp: , Rfl:    amLODipine (NORVASC) 5 MG tablet, TAKE 1 TABLET BY MOUTH DAILY, Disp: 90 tablet, Rfl: 3   apixaban (ELIQUIS) 5 MG TABS tablet, Take 1 tablet (5 mg total) by mouth 2 (two) times daily., Disp: 180 tablet, Rfl: 3   ascorbic acid (VITAMIN C) 500 MG tablet, Take 500 mg by mouth daily. Chewable, Disp: , Rfl:    carboxymethylcellulose (REFRESH PLUS) 0.5 % SOLN, Place 1 drop into both eyes daily as needed (dry eyes)., Disp: , Rfl:    clopidogrel (PLAVIX) 75 MG tablet, Take 1 tablet (75 mg total) by mouth daily., Disp: 90 tablet, Rfl: 1   diclofenac Sodium (VOLTAREN) 1 % GEL, Apply 2 g topically daily as needed (pain)., Disp: , Rfl:    famotidine (PEPCID) 10 MG tablet, Take 10 mg by mouth daily., Disp: , Rfl:    fexofenadine-pseudoephedrine (ALLEGRA-D 24) 180-240 MG per 24 hr tablet, Take 1 tablet by mouth daily as needed (allergies). Seasonal allergies, Disp: , Rfl:    FIBER ADULT GUMMIES PO, Take 2 tablets by mouth daily., Disp: , Rfl:    hydrochlorothiazide (HYDRODIURIL) 25 MG tablet, One half tablet (12.5 mg ) or one tablet (25 mg) as needed for BP/swelling., Disp: 30 tablet, Rfl: 9   Lidocaine 4 % PTCH, Apply 1 patch topically daily as needed (pain)., Disp: , Rfl:    metoprolol tartrate  (LOPRESSOR) 50 MG tablet, Take 1 tablet (50 mg) every morning, 1/2 tablet (25 mg) at lunch time, and 1 tablet (50 mg) in the evening (Patient taking differently: Take 25-50 mg by mouth See admin instructions. Take 1 tablet (50 mg) every morning, 1/2 tablet (25 mg) at lunch time, and 1 tablet (50 mg) in the evening), Disp: 270 tablet, Rfl: 3   nitroGLYCERIN (NITROSTAT) 0.4 MG SL tablet, Place 1 tablet (0.4 mg total) under the tongue every 5 (five) minutes as needed for chest pain., Disp: 25 tablet, Rfl: 3   Omega-3 Fatty Acids (FISH OIL) 1200 MG CAPS, Take 1,200 mg by mouth in the morning., Disp: , Rfl:    pregabalin (LYRICA) 75 MG capsule, TAKE 1 CAPSULE BY MOUTH TWO TIMES DAILY (Patient taking differently: Take 75 mg by mouth 2 (two) times daily.), Disp: 60 capsule, Rfl: 5   PRESCRIPTION MEDICATION, Apply 1 application. topically daily as needed (rash). Hydrocortisone 2% equal part with Ketoconazole cream 2 % (mix together equal parts), Disp: , Rfl:    rosuvastatin (CRESTOR) 40 MG tablet, Take 1 tablet (40 mg total) by mouth daily., Disp: 90 tablet, Rfl: 0   Vitamin D, Ergocalciferol, 2000 units CAPS, Take 2,000 Units by mouth daily., Disp: , Rfl:    alendronate (FOSAMAX) 70 MG tablet, Take 1 tablet (70 mg total) by mouth once a week. Take with a full glass of water on  an empty stomach. (Patient taking differently: Take 70 mg by mouth once a week. Take with a full glass of water on an empty stomach. 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    Tobacco Use: Social History   Tobacco Use  Smoking Status Never  Smokeless Tobacco Never    Labs: Review Flowsheet  More data exists      Latest Ref Rng & Units 10/31/2017 06/21/2018 10/09/2019 06/16/2021  Labs for ITP Cardiac and Pulmonary Rehab  Cholestrol 100 - 199 mg/dL - 234  213  211   LDL (calc) 0 - 99 mg/dL - 140  127  131   HDL-C >39 mg/dL - 57  55  53   Trlycerides 0 - 149 mg/dL - 186  175  151   Hemoglobin A1c 4.8 - 5.6 % 6.0  - - 6.2       01/13/2022  Labs for ITP Cardiac and Pulmonary Rehab  Cholestrol 99   LDL (calc) 25   HDL-C 56   Trlycerides 96   Hemoglobin A1c -    Capillary Blood Glucose: No results found for: "GLUCAP"   Exercise Target Goals: Exercise Program Goal: Individual exercise prescription set using results from initial 6 min walk test and THRR while considering  patient's activity barriers and safety.   Exercise Prescription Goal: Initial exercise prescription builds to 30-45 minutes a day of aerobic activity, 2-3 days per week.  Home exercise guidelines will be given to patient during program as part of exercise prescription that the participant will acknowledge.  Activity Barriers & Risk Stratification:  Activity Barriers & Cardiac Risk Stratification - 02/11/22 1411       Activity Barriers & Cardiac Risk Stratification   Activity Barriers Arthritis;Back Problems;Neck/Spine Problems;Joint Problems;Deconditioning;Muscular Weakness;Shortness of Breath;Balance Concerns    Cardiac Risk Stratification High             6 Minute Walk:  6 Minute Walk     Row Name 02/11/22 1300         6 Minute Walk   Phase Initial     Distance 1168 feet     Walk Time 6 minutes     # of Rest Breaks 0     MPH 2.21     METS 2.08     RPE 11     Perceived Dyspnea  0     VO2 Peak 7.27     Symptoms No     Resting HR 60 bpm     Resting BP  124/70     Resting Oxygen Saturation  98 %     Exercise Oxygen Saturation  during 6 min walk 98 %     Max Ex. HR 92 bpm     Max Ex. BP 130/68     2 Minute Post BP 118/70  Oxygen Initial Assessment:   Oxygen Re-Evaluation:   Oxygen Discharge (Final Oxygen Re-Evaluation):   Initial Exercise Prescription:  Initial Exercise Prescription - 02/11/22 1400       Date of Initial Exercise RX and Referring Provider   Date 02/11/22    Referring Provider Larae Grooms, MD    Expected Discharge Date 04/09/22      NuStep   Level 1    SPM 75    Minutes 30    METs 2      Prescription Details   Frequency (times per week) 3    Duration Progress to 30 minutes of continuous aerobic without signs/symptoms of physical distress      Intensity   THRR 40-80% of Max Heartrate 60-120    Ratings of Perceived Exertion 11-13    Perceived Dyspnea 0-4      Progression   Progression Continue progressive overload as per policy without signs/symptoms or physical distress.      Resistance Training   Training Prescription Yes    Weight 2 lbs    Reps 10-15             Perform Capillary Blood Glucose checks as needed.  Exercise Prescription Changes:   Exercise Comments:   Exercise Goals and Review:   Exercise Goals     Row Name 02/11/22 1414             Exercise Goals   Increase Physical Activity Yes       Intervention Provide advice, education, support and counseling about physical activity/exercise needs.;Develop an individualized exercise prescription for aerobic and resistive training based on initial evaluation findings, risk stratification, comorbidities and participant's personal goals.       Expected Outcomes Short Term: Attend rehab on a regular basis to increase amount of physical activity.;Long Term: Add in home exercise to make exercise part of routine and to increase amount of physical activity.;Long Term: Exercising regularly at least 3-5 days a  week.       Increase Strength and Stamina Yes       Intervention Provide advice, education, support and counseling about physical activity/exercise needs.;Develop an individualized exercise prescription for aerobic and resistive training based on initial evaluation findings, risk stratification, comorbidities and participant's personal goals.       Expected Outcomes Short Term: Increase workloads from initial exercise prescription for resistance, speed, and METs.;Short Term: Perform resistance training exercises routinely during rehab and add in resistance training at home;Long Term: Improve cardiorespiratory fitness, muscular endurance and strength as measured by increased METs and functional capacity (6MWT)       Able to understand and use rate of perceived exertion (RPE) scale Yes       Intervention Provide education and explanation on how to use RPE scale       Expected Outcomes Short Term: Able to use RPE daily in rehab to express subjective intensity level;Long Term:  Able to use RPE to guide intensity level when exercising independently       Knowledge and understanding of Target Heart Rate Range (THRR) Yes       Intervention Provide education and explanation of THRR including how the numbers were predicted and where they are located for reference       Expected Outcomes Short Term: Able to state/look up THRR;Short Term: Able to use daily as guideline for intensity in rehab;Long Term: Able to use THRR to govern intensity when exercising independently       Understanding of Exercise Prescription Yes  Intervention Provide education, explanation, and written materials on patient's individual exercise prescription       Expected Outcomes Short Term: Able to explain program exercise prescription;Long Term: Able to explain home exercise prescription to exercise independently                Exercise Goals Re-Evaluation :   Discharge Exercise Prescription (Final Exercise Prescription  Changes):   Nutrition:  Target Goals: Understanding of nutrition guidelines, daily intake of sodium '1500mg'$ , cholesterol '200mg'$ , calories 30% from fat and 7% or less from saturated fats, daily to have 5 or more servings of fruits and vegetables.  Biometrics:  Pre Biometrics - 02/11/22 1125       Pre Biometrics   Waist Circumference 42.75 inches    Hip Circumference 51 inches    Waist to Hip Ratio 0.84 %    Triceps Skinfold 30 mm    % Body Fat 47.3 %    Grip Strength 20 kg    Flexibility --   No done due to significant back issues   Single Leg Stand 3.56 seconds              Nutrition Therapy Plan and Nutrition Goals:   Nutrition Assessments:  MEDIFICTS Score Key: ?70 Need to make dietary changes  40-70 Heart Healthy Diet ? 40 Therapeutic Level Cholesterol Diet    Picture Your Plate Scores: <70 Unhealthy dietary pattern with much room for improvement. 41-50 Dietary pattern unlikely to meet recommendations for good health and room for improvement. 51-60 More healthful dietary pattern, with some room for improvement.  >60 Healthy dietary pattern, although there may be some specific behaviors that could be improved.    Nutrition Goals Re-Evaluation:   Nutrition Goals Re-Evaluation:   Nutrition Goals Discharge (Final Nutrition Goals Re-Evaluation):   Psychosocial: Target Goals: Acknowledge presence or absence of significant depression and/or stress, maximize coping skills, provide positive support system. Participant is able to verbalize types and ability to use techniques and skills needed for reducing stress and depression.  Initial Review & Psychosocial Screening:  Initial Psych Review & Screening - 02/11/22 1542       Initial Review   Current issues with Current Stress Concerns    Source of Stress Concerns Family    Comments Athena and her husband Simona Huh live with her elderly mother in law which can be stressful at times      [place]? Yes   Raeford Razor has her husband and her two children for support     Barriers   Psychosocial barriers to participate in program The patient should benefit from training in stress management and relaxation.      Screening Interventions   Interventions Encouraged to exercise    Expected Outcomes Long Term Goal: Stressors or current issues are controlled or eliminated.             Quality of Life Scores:  Quality of Life - 02/11/22 1419       Quality of Life   Select Quality of Life      Quality of Life Scores   Health/Function Pre 24.1 %    Socioeconomic Pre 23.5 %    Psych/Spiritual Pre 29.14 %    Family Pre 26.4 %    GLOBAL Pre 25.3 %            Scores of 19 and below usually indicate a poorer quality of life in these areas.  A difference of  2-3  points is a clinically meaningful difference.  A difference of 2-3 points in the total score of the Quality of Life Index has been associated with significant improvement in overall quality of life, self-image, physical symptoms, and general health in studies assessing change in quality of life.  PHQ-9: Review Flowsheet       02/11/2022 10/12/2021 06/21/2018 03/10/2017  Depression screen PHQ 2/9  Decreased Interest 0 0 0 0 0  Down, Depressed, Hopeless 0 0 0 0 0  PHQ - 2 Score 0 0 0 0 0  Altered sleeping - 0 - 0  Tired, decreased energy - 0 - 0  Change in appetite - 0 - 0  Feeling bad or failure about yourself  - 0 - 0  Trouble concentrating - 0 - 0  Moving slowly or fidgety/restless - 0 - 0  Suicidal thoughts - 0 - 0  PHQ-9 Score - 0 - 0  Difficult doing work/chores - Not difficult at all - -   Interpretation of Total Score  Total Score Depression Severity:  1-4 = Minimal depression, 5-9 = Mild depression, 10-14 = Moderate depression, 15-19 = Moderately severe depression, 20-27 = Severe depression   Psychosocial Evaluation and Intervention:   Psychosocial Re-Evaluation:   Psychosocial Discharge  (Final Psychosocial Re-Evaluation):   Vocational Rehabilitation: Provide vocational rehab assistance to qualifying candidates.   Vocational Rehab Evaluation & Intervention:  Vocational Rehab - 02/11/22 1544       Initial Vocational Rehab Evaluation & Intervention   Assessment shows need for Vocational Rehabilitation No   Azra is retired and does not need vocational rehab at this time            Education: Education Goals: Education classes will be provided on a weekly basis, covering required topics. Participant will state understanding/return demonstration of topics presented.     Core Videos: Exercise    Move It!  Clinical staff conducted group or individual video education with verbal and written material and guidebook.  Patient learns the recommended Pritikin exercise program. Exercise with the goal of living a long, healthy life. Some of the health benefits of exercise include controlled diabetes, healthier blood pressure levels, improved cholesterol levels, improved heart and lung capacity, improved sleep, and better body composition. Everyone should speak with their doctor before starting or changing an exercise routine.  Biomechanical Limitations Clinical staff conducted group or individual video education with verbal and written material and guidebook.  Patient learns how biomechanical limitations can impact exercise and how we can mitigate and possibly overcome limitations to have an impactful and balanced exercise routine.  Body Composition Clinical staff conducted group or individual video education with verbal and written material and guidebook.  Patient learns that body composition (ratio of muscle mass to fat mass) is a key component to assessing overall fitness, rather than body weight alone. Increased fat mass, especially visceral belly fat, can put [place] at increased risk for metabolic syndrome, type 2 diabetes, heart disease, and even death. It is recommended to  combine diet and exercise (cardiovascular and resistance training) to improve your body composition. Seek guidance from your physician and exercise physiologist before implementing an exercise routine.  Exercise Action Plan Clinical staff conducted group or individual video education with verbal and written material and guidebook.  Patient learns the recommended strategies to achieve and enjoy long-term exercise adherence, including variety, self-motivation, self-efficacy, and positive decision making. Benefits of exercise include fitness, good health, weight management, more energy, better sleep, less stress, and overall  well-being.  Medical   Heart Disease Risk Reduction Clinical staff conducted group or individual video education with verbal and written material and guidebook.  Patient learns our heart is our most vital organ as it circulates oxygen, nutrients, white blood cells, and hormones throughout the entire body, and carries waste away. Data supports a plant-based eating plan like the Pritikin Program for its effectiveness in slowing progression of and reversing heart disease. The video provides a number of recommendations to address heart disease.   Metabolic Syndrome and Belly Fat  Clinical staff conducted group or individual video education with verbal and written material and guidebook.  Patient learns what metabolic syndrome is, how it leads to heart disease, and how one can reverse it and keep it from coming back. You have metabolic syndrome if you have 3 of the following 5 criteria: abdominal obesity, high blood pressure, high triglycerides, low HDL cholesterol, and high blood sugar.  Hypertension and Heart Disease Clinical staff conducted group or individual video education with verbal and written material and guidebook.  Patient learns that high blood pressure, or hypertension, is very common in the [place]. Hypertension is largely due to excessive salt intake, but other  important risk factors include being overweight, physical inactivity, drinking too much alcohol, smoking, and not eating enough potassium from fruits and vegetables. High blood pressure is a leading risk factor for heart attack, stroke, congestive heart failure, dementia, kidney failure, and premature death. Long-term effects of excessive salt intake include stiffening of the arteries and thickening of heart muscle and organ damage. Recommendations include ways to reduce hypertension and the risk of heart disease.  Diseases of Our Time - Focusing on Diabetes Clinical staff conducted group or individual video education with verbal and written material and guidebook.  Patient learns why the best way to stop diseases of our time is prevention, through food and other lifestyle changes. Medicine (such as prescription pills and surgeries) is often only a Band-Aid on the problem, not a long-term solution. Most common diseases of our time include obesity, type 2 diabetes, hypertension, heart disease, and cancer. The Pritikin Program is recommended and has been proven to help reduce, reverse, and/or prevent the damaging effects of metabolic syndrome.  Nutrition   Overview of the Pritikin Eating Plan  Clinical staff conducted group or individual video education with verbal and written material and guidebook.  Patient learns about the [place] for disease risk reduction. The [place] emphasizes a wide variety of unrefined, minimally-processed carbohydrates, like fruits, vegetables, whole grains, and legumes. Go, Caution, and Stop food choices are explained. Plant-based and lean animal proteins are emphasized. Rationale provided for low sodium intake for blood pressure control, low added sugars for blood sugar stabilization, and low added fats and oils for coronary artery disease risk reduction and weight management.  Calorie Density  Clinical staff conducted group or individual video  education with verbal and written material and guidebook.  Patient learns about calorie density and how it impacts the Pritikin Eating Plan. Knowing the characteristics of the food you choose will help you decide whether those foods will lead to weight gain or weight loss, and whether you want to consume more or less of them. Weight loss is usually a side effect of the Pritikin Eating Plan because of its focus on low calorie-dense foods.  Label Reading  Clinical staff conducted group or individual video education with verbal and written material and guidebook.  Patient learns about the Pritikin recommended label  reading guidelines and corresponding recommendations regarding calorie density, added sugars, sodium content, and whole grains.  Dining Out - Part 1  Clinical staff conducted group or individual video education with verbal and written material and guidebook.  Patient learns that restaurant meals can be sabotaging because they can be so high in calories, fat, sodium, and/or sugar. Patient learns recommended strategies on how to positively address this and avoid unhealthy pitfalls.  Facts on Fats  Clinical staff conducted group or individual video education with verbal and written material and guidebook.  Patient learns that lifestyle modifications can be just as effective, if not more so, as many medications for lowering your risk of heart disease. A Pritikin lifestyle can help to reduce your risk of inflammation and atherosclerosis (cholesterol build-up, or plaque, in the artery walls). Lifestyle interventions such as dietary choices and physical activity address the cause of atherosclerosis. A review of the types of fats and their impact on blood cholesterol levels, along with dietary recommendations to reduce fat intake is also included.  Nutrition Action Plan  Clinical staff conducted group or individual video education with verbal and written material and guidebook.  Patient learns how  to incorporate Pritikin recommendations into their lifestyle. Recommendations include planning and keeping personal health goals in mind as an important part of their success.  Healthy Mind-Set    Healthy Minds, Bodies, Hearts  Clinical staff conducted group or individual video education with verbal and written material and guidebook.  Patient learns how to identify when they are stressed. Video will discuss the impact of that stress, as well as the many benefits of stress management. Patient will also be introduced to stress management techniques. The way we think, act, and feel has an impact on our hearts.  How Our Thoughts Can Heal Our Hearts  Clinical staff conducted group or individual video education with verbal and written material and guidebook.  Patient learns that negative thoughts can cause depression and anxiety. This can result in negative lifestyle behavior and serious health problems. Cognitive behavioral therapy is an effective method to help control our thoughts in order to change and improve our emotional outlook.  Additional Videos:  Exercise    Improving Performance  Clinical staff conducted group or individual video education with verbal and written material and guidebook.  Patient learns to use a non-linear approach by alternating intensity levels and lengths of time spent exercising to help burn more calories and lose more body fat. Cardiovascular exercise helps improve heart health, metabolism, hormonal balance, blood sugar control, and recovery from fatigue. Resistance training improves strength, endurance, balance, coordination, reaction time, metabolism, and muscle mass. Flexibility exercise improves circulation, posture, and balance. Seek guidance from your physician and exercise physiologist before implementing an exercise routine and learn your capabilities and proper form for all exercise.  Introduction to Yoga  Clinical staff conducted group or individual video  education with verbal and written material and guidebook.  Patient learns about yoga, a discipline of the coming together of mind, breath, and body. The benefits of yoga include improved flexibility, improved range of motion, better posture and core strength, increased lung function, weight loss, and positive self-image. Yoga's heart health benefits include lowered blood pressure, healthier heart rate, decreased cholesterol and triglyceride levels, improved immune function, and reduced stress. Seek guidance from your physician and exercise physiologist before implementing an exercise routine and learn your capabilities and proper form for all exercise.  Medical   Aging: Enhancing Your Quality of Life  Clinical staff  conducted group or individual video education with verbal and written material and guidebook.  Patient learns key strategies and recommendations to stay in good physical health and enhance quality of life, such as prevention strategies, having an advocate, securing a [place], and keeping a list of medications and system for tracking them. It also discusses how to avoid risk for bone loss.  Biology of Weight Control  Clinical staff conducted group or individual video education with verbal and written material and guidebook.  Patient learns that weight gain occurs because we consume more calories than we burn (eating more, moving less). Even if your body weight is normal, you may have higher ratios of fat compared to muscle mass. Too much body fat puts you at increased risk for cardiovascular disease, heart attack, stroke, type 2 diabetes, and obesity-related cancers. In addition to exercise, following the [place] can help reduce your risk.  Decoding Lab Results  Clinical staff conducted group or individual video education with verbal and written material and guidebook.  Patient learns that lab test reflects one measurement whose values change over  time and are influenced by many factors, including medication, stress, sleep, exercise, food, hydration, pre-existing medical conditions, and more. It is recommended to use the knowledge from this video to become more involved with your lab results and evaluate your numbers to speak with your doctor.   Diseases of Our Time - Overview  Clinical staff conducted group or individual video education with verbal and written material and guidebook.  Patient learns that according to the CDC, 50% to 70% of chronic diseases (such as obesity, type 2 diabetes, elevated lipids, hypertension, and heart disease) are avoidable through lifestyle improvements including healthier food choices, listening to satiety cues, and increased physical activity.  Sleep Disorders Clinical staff conducted group or individual video education with verbal and written material and guidebook.  Patient learns how good quality and duration of sleep are important to overall health and well-being. Patient also learns about sleep disorders and how they impact health along with recommendations to address them, including discussing with a physician.  Nutrition  Dining Out - Part 2 Clinical staff conducted group or individual video education with verbal and written material and guidebook.  Patient learns how to plan ahead and communicate in order to maximize their dining experience in a healthy and nutritious manner. Included are recommended food choices based on the type of restaurant the patient is visiting.   Fueling a Best boy conducted group or individual video education with verbal and written material and guidebook.  There is a strong connection between our food choices and our health. Diseases like obesity and type 2 diabetes are very prevalent and are in large-part due to lifestyle choices. The Pritikin Eating Plan provides plenty of food and hunger-curbing satisfaction. It is easy to follow, affordable, and  helps reduce health risks.  Menu Workshop  Clinical staff conducted group or individual video education with verbal and written material and guidebook.  Patient learns that restaurant meals can sabotage health goals because they are often packed with calories, fat, sodium, and sugar. Recommendations include strategies to plan ahead and to communicate with the manager, chef, or server to help order a healthier meal.  Planning Your Eating Strategy  Clinical staff conducted group or individual video education with verbal and written material and guidebook.  Patient learns about the [place] and its benefit of reducing the risk of disease.  The [place] does not focus on calories. Instead, it emphasizes high-quality, nutrient-rich foods. By knowing the characteristics of the foods, we choose, we can determine their calorie density and make informed decisions.  Targeting Your Nutrition Priorities  Clinical staff conducted group or individual video education with verbal and written material and guidebook.  Patient learns that lifestyle habits have a tremendous impact on disease risk and progression. This video provides eating and physical activity recommendations based on your personal health goals, such as reducing LDL cholesterol, losing weight, preventing or controlling type 2 diabetes, and reducing high blood pressure.  Vitamins and Minerals  Clinical staff conducted group or individual video education with verbal and written material and guidebook.  Patient learns different ways to obtain key vitamins and minerals, including through a recommended healthy diet. It is important to discuss all supplements you take with your doctor.   Healthy Mind-Set    Smoking Cessation  Clinical staff conducted group or individual video education with verbal and written material and guidebook.  Patient learns that cigarette smoking and tobacco addiction pose a serious health risk which  affects millions of people. Stopping smoking will significantly reduce the risk of heart disease, lung disease, and many forms of cancer. Recommended strategies for quitting are covered, including working with your doctor to develop a successful plan.  Culinary   Becoming a Financial trader conducted group or individual video education with verbal and written material and guidebook.  Patient learns that cooking at home can be healthy, cost-effective, quick, and puts them in control. Keys to cooking healthy recipes will include looking at your recipe, assessing your equipment needs, planning ahead, making it simple, choosing cost-effective seasonal ingredients, and limiting the use of added fats, salts, and sugars.  Cooking - Breakfast and Snacks  Clinical staff conducted group or individual video education with verbal and written material and guidebook.  Patient learns how important breakfast is to satiety and nutrition through the entire day. Recommendations include key foods to eat during breakfast to help stabilize blood sugar levels and to prevent overeating at meals later in the day. Planning ahead is also a key component.  Cooking - Human resources officer conducted group or individual video education with verbal and written material and guidebook.  Patient learns eating strategies to improve overall health, including an approach to cook more at home. Recommendations include thinking of animal protein as a side on your plate rather than center stage and focusing instead on lower calorie dense options like vegetables, fruits, whole grains, and plant-based proteins, such as beans. Making sauces in large quantities to freeze for later and leaving the skin on your vegetables are also recommended to maximize your experience.  Cooking - Healthy Salads and Dressing Clinical staff conducted group or individual video education with verbal and written material and guidebook.   Patient learns that vegetables, fruits, whole grains, and legumes are the foundations of the [place]. Recommendations include how to incorporate each of these in flavorful and healthy salads, and how to create homemade salad dressings. Proper handling of ingredients is also covered. Cooking - Soups and [employer] - Soups and Desserts Clinical staff conducted group or individual video education with verbal and written material and guidebook.  Patient learns that Pritikin soups and desserts make for easy, nutritious, and delicious snacks and meal components that are low in sodium, fat, sugar, and calorie density, while high in vitamins, minerals, and filling fiber. Recommendations  include simple and healthy ideas for soups and desserts.   Overview     The Pritikin Solution Program Overview Clinical staff conducted group or individual video education with verbal and written material and guidebook.  Patient learns that the results of the [place] Program have been documented in more than 100 articles published in peer-reviewed journals, and the benefits include reducing risk factors for (and, in some cases, even reversing) high cholesterol, high blood pressure, type 2 diabetes, obesity, and more! An overview of the three key pillars of the Pritikin Program will be covered: eating well, doing regular exercise, and having a healthy mind-set.  WORKSHOPS  Exercise: Exercise Basics: Building Your Action Plan Clinical staff led group instruction and group discussion with PowerPoint presentation and patient guidebook. To enhance the learning environment the use of posters, models and videos may be added. At the conclusion of this workshop, patients will comprehend the difference between physical activity and exercise, as well as the benefits of incorporating both, into their routine. Patients will understand the FITT (Frequency, Intensity, Time, and Type) principle and how to use it to  build an exercise action plan. In addition, safety concerns and other considerations for exercise and cardiac rehab will be addressed by the presenter. The purpose of this lesson is to promote a comprehensive and effective weekly exercise routine in order to improve patients' overall level of fitness.   Managing Heart Disease: Your Path to a Healthier Heart Clinical staff led group instruction and group discussion with PowerPoint presentation and patient guidebook. To enhance the learning environment the use of posters, models and videos may be added.At the conclusion of this workshop, patients will understand the anatomy and physiology of the heart. Additionally, they will understand how Pritikin's three pillars impact the risk factors, the progression, and the management of heart disease.  The purpose of this lesson is to provide a high-level overview of the heart, heart disease, and how the Pritikin lifestyle positively impacts risk factors.  Exercise Biomechanics Clinical staff led group instruction and group discussion with PowerPoint presentation and patient guidebook. To enhance the learning environment the use of posters, models and videos may be added. Patients will learn how the structural parts of their bodies function and how these functions impact their daily activities, movement, and exercise. Patients will learn how to promote a neutral spine, learn how to manage pain, and identify ways to improve their physical movement in order to promote healthy living. The purpose of this lesson is to expose patients to common physical limitations that impact physical activity. Participants will learn practical ways to adapt and manage aches and pains, and to minimize their effect on regular exercise. Patients will learn how to maintain good posture while sitting, walking, and lifting.  Balance Training and Fall Prevention  Clinical staff led group instruction and group discussion with  PowerPoint presentation and patient guidebook. To enhance the learning environment the use of posters, models and videos may be added. At the conclusion of this workshop, patients will understand the importance of their sensorimotor skills (vision, proprioception, and the vestibular system) in maintaining their ability to balance as they age. Patients will apply a variety of balancing exercises that are appropriate for their current level of function. Patients will understand the common causes for poor balance, possible solutions to these problems, and ways to modify their physical environment in order to minimize their fall risk. The purpose of this lesson is to teach patients about the importance of maintaining balance as they  age and ways to minimize their risk of falling.  WORKSHOPS   Nutrition:  Fueling a Scientist, research (physical sciences) led group instruction and group discussion with PowerPoint presentation and patient guidebook. To enhance the learning environment the use of posters, models and videos may be added. Patients will review the foundational principles of the [place] and understand what constitutes a serving size in each of the food groups. Patients will also learn Pritikin-friendly foods that are better choices when away from home and review make-ahead meal and snack options. Calorie density will be reviewed and applied to three nutrition priorities: weight maintenance, weight loss, and weight gain. The purpose of this lesson is to reinforce (in a group setting) the key concepts around what patients are recommended to eat and how to apply these guidelines when away from home by planning and selecting Pritikin-friendly options. Patients will understand how calorie density may be adjusted for different weight management goals.  Mindful Eating  Clinical staff led group instruction and group discussion with PowerPoint presentation and patient guidebook. To enhance the learning  environment the use of posters, models and videos may be added. Patients will briefly review the concepts of the [place] and the importance of low-calorie dense foods. The concept of mindful eating will be introduced as well as the importance of paying attention to internal hunger signals. Triggers for non-hunger eating and techniques for dealing with triggers will be explored. The purpose of this lesson is to provide patients with the opportunity to review the basic principles of the [place], discuss the value of eating mindfully and how to measure internal cues of hunger and fullness using the Hunger Scale. Patients will also discuss reasons for non-hunger eating and learn strategies to use for controlling emotional eating.  Targeting Your Nutrition Priorities Clinical staff led group instruction and group discussion with PowerPoint presentation and patient guidebook. To enhance the learning environment the use of posters, models and videos may be added. Patients will learn how to determine their genetic susceptibility to disease by reviewing their family history. Patients will gain insight into the importance of diet as part of an overall healthy lifestyle in mitigating the impact of genetics and other environmental insults. The purpose of this lesson is to provide patients with the opportunity to assess their personal nutrition priorities by looking at their family history, their own health history and current risk factors. Patients will also be able to discuss ways of prioritizing and modifying the [place] for their highest risk areas  Menu  Clinical staff led group instruction and group discussion with PowerPoint presentation and patient guidebook. To enhance the learning environment the use of posters, models and videos may be added. Using menus brought in from [employer], or printed from [employer], patients will apply the [place] dining out guidelines  that were presented in the [employer] video. Patients will also be able to practice these guidelines in a variety of provided scenarios. The purpose of this lesson is to provide patients with the opportunity to practice hands-on learning of the [place] with actual menus and practice scenarios.  Label Reading Clinical staff led group instruction and group discussion with PowerPoint presentation and patient guidebook. To enhance the learning environment the use of posters, models and videos may be added. Patients will review and discuss the Pritikin label reading guidelines presented in Pritikin's Label Reading Educational series video. Using fool labels brought in from local grocery  stores and markets, patients will apply the label reading guidelines and determine if the packaged food meet the Pritikin guidelines. The purpose of this lesson is to provide patients with the opportunity to review, discuss, and practice hands-on learning of the Pritikin Label Reading guidelines with actual packaged food labels. [place] Workshops are designed to teach patients ways to prepare quick, simple, and affordable recipes at home. The importance of nutrition's role in chronic disease risk reduction is reflected in its emphasis in the overall Pritikin program. By learning how to prepare essential core Pritikin Eating Plan recipes, patients will increase control over what they eat; be able to customize the flavor of foods without the use of added salt, sugar, or fat; and improve the quality of the food they consume. By learning a set of core recipes which are easily assembled, quickly prepared, and affordable, patients are more likely to prepare more healthy foods at home. These workshops focus on convenient breakfasts, simple entres, side dishes, and desserts which can be prepared with minimal effort and are consistent with nutrition recommendations  for cardiovascular risk reduction. Cooking [employer] are taught by a Engineer, materials (RD) who has been trained by the [employer]. The chef or RD has a clear understanding of the importance of minimizing - if not completely eliminating - added fat, sugar, and sodium in recipes. Throughout the series of [place] Workshop sessions, patients will learn about healthy ingredients and efficient methods of cooking to build confidence in their capability to prepare    Cooking School weekly topics:  Adding Flavor- Sodium-Free  Fast and Healthy Breakfasts  Powerhouse Plant-Based Proteins  Satisfying Salads and Dressings  Simple Sides and Sauces  International Cuisine-Spotlight on the [place] Zones  Delicious Desserts  Savory Soups  Efficiency Cooking - Meals in a Snap  Tasty Appetizers and Snacks  Comforting Weekend Breakfasts  One-Pot Wonders   Fast Evening Meals  Easy [place] (Psychosocial): New Thoughts, New Behaviors Clinical staff led group instruction and group discussion with PowerPoint presentation and patient guidebook. To enhance the learning environment the use of posters, models and videos may be added. Patients will learn and practice techniques for developing effective health and lifestyle goals. Patients will be able to effectively apply the goal setting process learned to develop at least one new personal goal.  The purpose of this lesson is to expose patients to a new skill set of behavior modification techniques such as techniques setting SMART goals, overcoming barriers, and achieving new thoughts and new behaviors.  Managing Moods and Relationships Clinical staff led group instruction and group discussion with PowerPoint presentation and patient guidebook. To enhance the learning environment the use of posters, models and videos may be added. Patients will learn how  emotional and chronic stress factors can impact their health and relationships. They will learn healthy ways to manage their moods and utilize positive coping mechanisms. In addition, ICR patients will learn ways to improve communication skills. The purpose of this lesson is to expose patients to ways of understanding how one's mood and health are intimately connected. Developing a healthy outlook can help build positive relationships and connections with others. Patients will understand the importance of utilizing effective communication skills that include actively listening and being heard. They will learn and understand the importance of the "4 Cs" and especially Connections in fostering of a Healthy Mind-Set.  Healthy Sleep  for a Healthy Heart Clinical staff led group instruction and group discussion with PowerPoint presentation and patient guidebook. To enhance the learning environment the use of posters, models and videos may be added. At the conclusion of this workshop, patients will be able to demonstrate knowledge of the importance of sleep to overall health, well-being, and quality of life. They will understand the symptoms of, and treatments for, common sleep disorders. Patients will also be able to identify daytime and nighttime behaviors which impact sleep, and they will be able to apply these tools to help manage sleep-related challenges. The purpose of this lesson is to provide patients with a general overview of sleep and outline the importance of quality sleep. Patients will learn about a few of the most common sleep disorders. Patients will also be introduced to the concept of "sleep hygiene," and discover ways to self-manage certain sleeping problems through simple daily behavior changes. Finally, the workshop will motivate patients by clarifying the links between quality sleep and their goals of heart-healthy living.   Recognizing and Reducing Stress Clinical staff led group instruction  and group discussion with PowerPoint presentation and patient guidebook. To enhance the learning environment the use of posters, models and videos may be added. At the conclusion of this workshop, patients will be able to understand the types of stress reactions, differentiate between acute and chronic stress, and recognize the impact that chronic stress has on their health. They will also be able to apply different coping mechanisms, such as reframing negative self-talk. Patients will have the opportunity to practice a variety of stress management techniques, such as deep abdominal breathing, progressive muscle relaxation, and/or guided imagery.  The purpose of this lesson is to educate patients on the role of stress in their lives and to provide healthy techniques for coping with it.  Learning Barriers/Preferences:  Learning Barriers/Preferences - 02/11/22 1420       Learning Barriers/Preferences   Learning Barriers Sight   wears glasses   Learning Preferences Audio;Computer/Internet;Group Instruction;Individual Instruction;Pictoral;Skilled Demonstration;Verbal Instruction;Video             Education Topics:  Knowledge Questionnaire Score:  Knowledge Questionnaire Score - 02/11/22 1419       Knowledge Questionnaire Score   Pre Score 21/24             Core Components/Risk Factors/Patient Goals at Admission:  Personal Goals and Risk Factors at Admission - 02/11/22 1422       Core Components/Risk Factors/Patient Goals on Admission    Weight Management Yes;Weight Loss    Intervention Weight Management: Develop a combined nutrition and exercise program designed to reach desired caloric intake, while maintaining appropriate intake of nutrient and fiber, sodium and fats, and appropriate energy expenditure required for the weight goal.;Weight Management: Provide education and appropriate resources to help participant work on and attain dietary goals.;Weight Management/Obesity:  Establish reasonable short term and long term weight goals.;Obesity: Provide education and appropriate resources to help participant work on and attain dietary goals.    Admit Weight 181 lb 3.5 oz (82.2 kg)    Expected Outcomes Short Term: Continue to assess and modify interventions until short term weight is achieved;Long Term: Adherence to nutrition and physical activity/exercise program aimed toward attainment of established weight goal;Weight Maintenance: Understanding of the daily nutrition guidelines, which includes 25-35% calories from fat, 7% or less cal from saturated fats, less than '200mg'$  cholesterol, less than 1.5gm of sodium, & 5 or more servings of fruits and vegetables daily;Weight Loss: Understanding of  general recommendations for a balanced deficit meal plan, which promotes 1-2 lb weight loss per week and includes a negative energy balance of [phone] kcal/d;Understanding recommendations for meals to include 15-35% energy as protein, 25-35% energy from fat, 35-60% energy from carbohydrates, less than '200mg'$  of dietary cholesterol, 20-35 gm of total fiber daily;Understanding of distribution of calorie intake throughout the day with the consumption of 4-5 meals/snacks    Hypertension Yes    Intervention Provide education on lifestyle modifcations including regular physical activity/exercise, weight management, moderate sodium restriction and increased consumption of fresh fruit, vegetables, and low fat dairy, alcohol moderation, and smoking cessation.;Monitor prescription use compliance.    Expected Outcomes Short Term: Continued assessment and intervention until BP is < 140/41m HG in hypertensive participants. < 130/830mHG in hypertensive participants with diabetes, heart failure or chronic kidney disease.;Long Term: Maintenance of blood pressure at goal levels.    Lipids Yes    Intervention Provide education and support for participant on nutrition & aerobic/resistive exercise along with  prescribed medications to achieve LDL '70mg'$ , HDL >'40mg'$ .    Expected Outcomes Short Term: Participant states understanding of desired cholesterol values and is compliant with medications prescribed. Participant is following exercise prescription and nutrition guidelines.;Long Term: Cholesterol controlled with medications as prescribed, with individualized exercise RX and with personalized nutrition plan. Value goals: LDL < '70mg'$ , HDL > 40 mg.    Stress Yes    Intervention Offer individual and/or small group education and counseling on adjustment to heart disease, stress management and health-related lifestyle change. Teach and support self-help strategies.;Refer participants experiencing significant psychosocial distress to appropriate mental health specialists for further evaluation and treatment. When possible, include family members and significant others in education/counseling sessions.    Expected Outcomes Short Term: Participant demonstrates changes in health-related behavior, relaxation and other stress management skills, ability to obtain effective social support, and compliance with psychotropic medications if prescribed.;Long Term: Emotional wellbeing is indicated by absence of clinically significant psychosocial distress or social isolation.             Core Components/Risk Factors/Patient Goals Review:    Core Components/Risk Factors/Patient Goals at Discharge (Final Review):    ITP Comments:  ITP Comments     Row Name 02/11/22 1537           ITP Comments Dr TrFransico HimD, Medical Director. Introduction to [facility] program. Initial Resource packet reviewed with the patient and her husband.                Comments:Participant attended orientation for the cardiac rehabilitation program on  02/11/2022  to perform initial intake and exercise walk test. [facility] and orientation packet was reviewed.  Completed 6-minute walk test, measurements, initial ITP, and exercise prescription. Vital signs stable. Telemetry-Sinus Brady, sinus rhythm, asymptomatic. MaHarrell GaveN BSN   Service time was from 1125 to 1324.

## 2022-02-11 NOTE — Progress Notes (Signed)
Cardiac Rehab Medication Review by a Nurse   Does the patient  feel that his/her medications are working for him/her?  yes  Has the patient been experiencing any side effects to the medications prescribed?  no  Does the patient measure his/her own blood pressure or blood glucose at home?  yes   Does the patient have any problems obtaining medications due to transportation or finances?   no  Understanding of regimen: excellent Understanding of indications: excellent Potential of compliance: excellent    Nurse comments: Barbara Fletcher is taking her medications as prescribed and has a good understanding of what her medications are for. Barbara Fletcher has a BP, cuff/monitor. Barbara Fletcher does not check her blood pressures on a regular basis.    Christa See Mariska Daffin RN 02/11/2022 3:16 PM

## 2022-02-15 ENCOUNTER — Other Ambulatory Visit: Payer: Self-pay

## 2022-02-15 ENCOUNTER — Encounter (HOSPITAL_COMMUNITY)
Admission: RE | Admit: 2022-02-15 | Discharge: 2022-02-15 | Disposition: A | Discharge status: Home / Self Care | Payer: Medicare Other | Source: Ambulatory Visit | Attending: Interventional Cardiology | Admitting: Interventional Cardiology

## 2022-02-15 DIAGNOSIS — Z955 Presence of coronary angioplasty implant and graft: Secondary | ICD-10-CM | POA: Diagnosis not present

## 2022-02-15 NOTE — Progress Notes (Signed)
Daily Session Note  Patient Details  Name: Barbara Fletcher MRN: [id number] Date of Birth: 09/29/1950 Referring Provider:   Flowsheet Row INTENSIVE CARDIAC REHAB ORIENT from 02/11/2022 in [place]  Referring Provider Larae Grooms, MD       Encounter Date: 02/15/2022  Check In:  Session Check In - 02/15/22 1247       Check-In   Supervising physician immediately available to respond to emergencies [facility] immediately available    Physician(s) Dr Karleen Hampshire    Location [facility]    Staff Present Barnet Pall, RN, Milus Glazier, MS, ACSM-CEP, CCRP, Exercise Physiologist;Carlette Wilber Oliphant, RN, Deland Pretty, MS, ACSM CEP, Exercise Physiologist;Jetta Walker BS, ACSM EP-C, Exercise Physiologist    Virtual Visit No    Medication changes reported     No    Fall or balance concerns reported    No    Tobacco Cessation No Change    Current number of cigarettes/nicotine per day     0    Warm-up and Cool-down Performed as group-led instruction    Resistance Training Performed Yes    VAD Patient? No    PAD/SET Patient? No      Pain Assessment   Currently in Pain? No/denies    Pain Score 0-No pain    Multiple Pain Sites No             Capillary Blood Glucose: No results found for this or any previous visit (from the past 24 hour(s)).   Exercise Prescription Changes - 02/15/22 1600       Response to Exercise   Blood Pressure (Admit) 114/60    Blood Pressure (Exercise) 118/62    Blood Pressure (Exit) 106/78    Heart Rate (Admit) 52 bpm    Heart Rate (Exercise) 77 bpm    Heart Rate (Exit) 51 bpm    Rating of Perceived Exertion (Exercise) 11.5    Symptoms None    Comments Pt's first day in the [facility] program    Duration Continue with 30 min of aerobic exercise without signs/symptoms of physical distress.    Intensity THRR unchanged      Progression   Progression Continue to progress workloads to  maintain intensity without signs/symptoms of physical distress.    Average METs 2.3      Resistance Training   Training Prescription Yes    Weight 2 lbs    Reps 10-15    Time 10 Minutes      Interval Training   Interval Training No      NuStep   Level 1    SPM 76    Minutes 35    METs 2.3             Social History   Tobacco Use  Smoking Status Never  Smokeless Tobacco Never    Goals Met:  Exercise tolerated well No report of concerns or symptoms today Strength training completed today  Goals Unmet:  Not Applicable  Comments: [name] started cardiac rehab today.  Pt tolerated light exercise without difficulty. VSS, telemetry-Sinus Rhythm, asymptomatic.  Medication list reconciled. Pt denies barriers to medicaiton compliance.  PSYCHOSOCIAL ASSESSMENT:  PHQ-0. Pt exhibits positive coping skills, hopeful outlook with supportive family. No psychosocial needs identified at this time, no psychosocial interventions necessary.    Pt enjoys music and reading.   Pt oriented to exercise equipment and routine.    Understanding verbalized. Harrell Gave RN BSN    Dr.  Fransico Him is Market researcher for Cardiac Rehab at [facility].

## 2022-02-17 ENCOUNTER — Encounter (HOSPITAL_COMMUNITY)
Admission: RE | Admit: 2022-02-17 | Discharge: 2022-02-17 | Disposition: A | Discharge status: Home / Self Care | Payer: Medicare Other | Source: Ambulatory Visit | Attending: Interventional Cardiology | Admitting: Interventional Cardiology

## 2022-02-17 DIAGNOSIS — Z955 Presence of coronary angioplasty implant and graft: Secondary | ICD-10-CM | POA: Diagnosis not present

## 2022-02-19 ENCOUNTER — Encounter (HOSPITAL_COMMUNITY)
Admission: RE | Admit: 2022-02-19 | Discharge: 2022-02-19 | Disposition: A | Discharge status: Home / Self Care | Payer: Medicare Other | Source: Ambulatory Visit | Attending: Interventional Cardiology | Admitting: Interventional Cardiology

## 2022-02-19 DIAGNOSIS — Z955 Presence of coronary angioplasty implant and graft: Secondary | ICD-10-CM | POA: Diagnosis not present

## 2022-02-22 ENCOUNTER — Encounter (HOSPITAL_COMMUNITY)
Admission: RE | Admit: 2022-02-22 | Discharge: 2022-02-22 | Disposition: A | Discharge status: Home / Self Care | Payer: Medicare Other | Source: Ambulatory Visit | Attending: Interventional Cardiology | Admitting: Interventional Cardiology

## 2022-02-22 DIAGNOSIS — Z955 Presence of coronary angioplasty implant and graft: Secondary | ICD-10-CM | POA: Diagnosis not present

## 2022-02-22 MED FILL — Pregabalin Cap 75 MG: ORAL | 30 days supply | Qty: 60 | Fill #4 | Status: AC

## 2022-02-23 ENCOUNTER — Other Ambulatory Visit: Payer: Self-pay

## 2022-02-24 ENCOUNTER — Encounter (HOSPITAL_COMMUNITY)
Admission: RE | Admit: 2022-02-24 | Discharge: 2022-02-24 | Disposition: A | Discharge status: Home / Self Care | Payer: Medicare Other | Source: Ambulatory Visit | Attending: Interventional Cardiology | Admitting: Interventional Cardiology

## 2022-02-24 DIAGNOSIS — Z955 Presence of coronary angioplasty implant and graft: Secondary | ICD-10-CM

## 2022-02-24 NOTE — Progress Notes (Signed)
Cardiac Individual Treatment Plan  Patient Details  Name: Barbara Fletcher MRN: [id number] Date of Birth: 04-Mar-1951 Referring Provider:   Flowsheet Row INTENSIVE CARDIAC REHAB ORIENT from 02/11/2022 in [place]  Referring Provider Larae Grooms, MD       Initial Encounter Date:  [facility] from 02/11/2022 in [place]  Date 02/11/22       Visit Diagnosis: 11/19/21 S/P DES LAD, PTCA 1st diagonal   Patient's Home Medications on Admission:  Current Outpatient Medications:    acetaminophen (TYLENOL) 500 MG tablet, Take 1,000 mg by mouth every 6 (six) hours as needed (Arthritis)., Disp: , Rfl:    alendronate (FOSAMAX) 70 MG tablet, Take 1 tablet (70 mg total) by mouth once a week. Take with a full glass of water on an empty stomach. (Patient taking differently: Take 70 mg by mouth once a week. Take with a full glass of water on an empty stomach. Sunday), Disp: 12 tablet, Rfl: 4   amLODipine (NORVASC) 5 MG tablet, TAKE 1 TABLET BY MOUTH DAILY, Disp: 90 tablet, Rfl: 3   apixaban (ELIQUIS) 5 MG TABS tablet, Take 1 tablet (5 mg total) by mouth 2 (two) times daily., Disp: 180 tablet, Rfl: 3   ascorbic acid (VITAMIN C) 500 MG tablet, Take 500 mg by mouth daily. Chewable, Disp: , Rfl:    carboxymethylcellulose (REFRESH PLUS) 0.5 % SOLN, Place 1 drop into both eyes daily as needed (dry eyes)., Disp: , Rfl:    clopidogrel (PLAVIX) 75 MG tablet, Take 1 tablet (75 mg total) by mouth daily., Disp: 90 tablet, Rfl: 1   diclofenac Sodium (VOLTAREN) 1 % GEL, Apply 2 g topically daily as needed (pain)., Disp: , Rfl:    famotidine (PEPCID) 10 MG tablet, Take 10 mg by mouth daily., Disp: , Rfl:    fexofenadine-pseudoephedrine (ALLEGRA-D 24) 180-240 MG per 24 hr tablet, Take 1 tablet by mouth daily as needed (allergies). Seasonal allergies, Disp: , Rfl:    FIBER ADULT GUMMIES PO, Take 2 tablets by mouth  daily., Disp: , Rfl:    hydrochlorothiazide (HYDRODIURIL) 25 MG tablet, One half tablet (12.5 mg ) or one tablet (25 mg) as needed for BP/swelling., Disp: 30 tablet, Rfl: 9   Lidocaine 4 % PTCH, Apply 1 patch topically daily as needed (pain)., Disp: , Rfl:    metoprolol tartrate (LOPRESSOR) 50 MG tablet, Take 1 tablet (50 mg) every morning, 1/2 tablet (25 mg) at lunch time, and 1 tablet (50 mg) in the evening (Patient taking differently: Take 25-50 mg by mouth See admin instructions. Take 1 tablet (50 mg) every morning, 1/2 tablet (25 mg) at lunch time, and 1 tablet (50 mg) in the evening), Disp: 270 tablet, Rfl: 3   nitroGLYCERIN (NITROSTAT) 0.4 MG SL tablet, Place 1 tablet (0.4 mg total) under the tongue every 5 (five) minutes as needed for chest pain., Disp: 25 tablet, Rfl: 3   Omega-3 Fatty Acids (FISH OIL) 1200 MG CAPS, Take 1,200 mg by mouth in the morning., Disp: , Rfl:    pregabalin (LYRICA) 75 MG capsule, TAKE 1 CAPSULE BY MOUTH TWO TIMES DAILY (Patient taking differently: Take 75 mg by mouth 2 (two) times daily.), Disp: 60 capsule, Rfl: 5   PRESCRIPTION MEDICATION, Apply 1 application. topically daily as needed (rash). Hydrocortisone 2% equal part with Ketoconazole cream 2 % (mix together equal parts), Disp: , Rfl:    rosuvastatin (CRESTOR) 40 MG tablet, Take  1 tablet (40 mg total) by mouth daily., Disp: 90 tablet, Rfl: 0   Vitamin D, Ergocalciferol, 2000 units CAPS, Take 2,000 Units by mouth daily., Disp: , Rfl:   Past Medical History: Past Medical History:  Diagnosis Date   Arthritis    Cervical radiculopathy    Chronic bilateral low back pain without sciatica    Class 2 severe obesity due to excess calories with serious comorbidity and body mass index (BMI) of 35.0 to 35.9 in adult [facility])    Coronary artery disease    Diverticulitis    Frequent headaches    Headaches, cluster    Hyperlipidemia    Hypertension    Osteoporosis without current pathological fracture    Palpitations     Squamous cell carcinoma of skin 11/24/2017   SCCIS, 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    Tobacco Use: Social History   Tobacco Use  Smoking Status Never  Smokeless Tobacco Never    Labs: Review Flowsheet  More data exists      Latest Ref Rng & Units 10/31/2017 06/21/2018 10/09/2019 06/16/2021  Labs for ITP Cardiac and Pulmonary Rehab  Cholestrol 100 - 199 mg/dL - 234  213  211   LDL (calc) 0 - 99 mg/dL - 140  127  131   HDL-C >39 mg/dL - 57  55  53   Trlycerides 0 - 149 mg/dL - 186  175  151   Hemoglobin A1c 4.8 - 5.6 % 6.0  - - 6.2       01/13/2022  Labs for ITP Cardiac and Pulmonary Rehab  Cholestrol 99   LDL (calc) 25   HDL-C 56   Trlycerides 96   Hemoglobin A1c -    Capillary Blood Glucose: No results found for: "GLUCAP"   Exercise Target Goals: Exercise Program Goal: Individual exercise prescription set using results from initial 6 min walk test and THRR while considering  patient's activity barriers and safety.   Exercise Prescription Goal: Initial exercise prescription builds to 30-45 minutes a day of aerobic activity, 2-3 days per week.  Home exercise guidelines will be given to patient during program as part of exercise prescription that the participant will acknowledge.  Activity Barriers & Risk Stratification:  Activity Barriers & Cardiac Risk Stratification - 02/11/22 1411       Activity Barriers & Cardiac Risk Stratification   Activity Barriers Arthritis;Back Problems;Neck/Spine Problems;Joint Problems;Deconditioning;Muscular Weakness;Shortness of Breath;Balance Concerns    Cardiac Risk Stratification High             6 Minute Walk:  6 Minute Walk     Row Name 02/11/22 1300         6 Minute Walk   Phase Initial     Distance 1168 feet     Walk Time 6 minutes     # of Rest Breaks 0     MPH 2.21     METS 2.08     RPE 11     Perceived Dyspnea  0     VO2 Peak 7.27     Symptoms No     Resting HR 60 bpm     Resting BP 124/70      Resting Oxygen Saturation  98 %     Exercise Oxygen Saturation  during 6 min walk 98 %     Max Ex. HR 92 bpm     Max Ex. BP 130/68     2 Minute Post BP 118/70  Oxygen Initial Assessment:   Oxygen Re-Evaluation:   Oxygen Discharge (Final Oxygen Re-Evaluation):   Initial Exercise Prescription:  Initial Exercise Prescription - 02/11/22 1400       Date of Initial Exercise RX and Referring Provider   Date 02/11/22    Referring Provider Larae Grooms, MD    Expected Discharge Date 04/09/22      NuStep   Level 1    SPM 75    Minutes 30    METs 2      Prescription Details   Frequency (times per week) 3    Duration Progress to 30 minutes of continuous aerobic without signs/symptoms of physical distress      Intensity   THRR 40-80% of Max Heartrate 60-120    Ratings of Perceived Exertion 11-13    Perceived Dyspnea 0-4      Progression   Progression Continue progressive overload as per policy without signs/symptoms or physical distress.      Resistance Training   Training Prescription Yes    Weight 2 lbs    Reps 10-15             Perform Capillary Blood Glucose checks as needed.  Exercise Prescription Changes:   Exercise Prescription Changes     Row Name 02/15/22 1600             Response to Exercise   Blood Pressure (Admit) 114/60       Blood Pressure (Exercise) 118/62       Blood Pressure (Exit) 106/78       Heart Rate (Admit) 52 bpm       Heart Rate (Exercise) 77 bpm       Heart Rate (Exit) 51 bpm       Rating of Perceived Exertion (Exercise) 11.5       Symptoms None       Comments Pt's first day in the [facility] program       Duration Continue with 30 min of aerobic exercise without signs/symptoms of physical distress.       Intensity THRR unchanged         Progression   Progression Continue to progress workloads to maintain intensity without signs/symptoms of physical distress.       Average METs 2.3         Resistance  Training   Training Prescription Yes       Weight 2 lbs       Reps 10-15       Time 10 Minutes         Interval Training   Interval Training No         NuStep   Level 1       SPM 76       Minutes 35       METs 2.3                Exercise Comments:   Exercise Comments     Row Name 02/15/22 1625           Exercise Comments Pt's first day in the [place] program. Pt had no complaints with today's session. Pt is off to a good start.                Exercise Goals and Review:   Exercise Goals     Row Name 02/11/22 1414             Exercise Goals   Increase Physical Activity Yes  Intervention Provide advice, education, support and counseling about physical activity/exercise needs.;Develop an individualized exercise prescription for aerobic and resistive training based on initial evaluation findings, risk stratification, comorbidities and participant's personal goals.       Expected Outcomes Short Term: Attend rehab on a regular basis to increase amount of physical activity.;Long Term: Add in home exercise to make exercise part of routine and to increase amount of physical activity.;Long Term: Exercising regularly at least 3-5 days a week.       Increase Strength and Stamina Yes       Intervention Provide advice, education, support and counseling about physical activity/exercise needs.;Develop an individualized exercise prescription for aerobic and resistive training based on initial evaluation findings, risk stratification, comorbidities and participant's personal goals.       Expected Outcomes Short Term: Increase workloads from initial exercise prescription for resistance, speed, and METs.;Short Term: Perform resistance training exercises routinely during rehab and add in resistance training at home;Long Term: Improve cardiorespiratory fitness, muscular endurance and strength as measured by increased METs and functional capacity (6MWT)       Able to understand and  use rate of perceived exertion (RPE) scale Yes       Intervention Provide education and explanation on how to use RPE scale       Expected Outcomes Short Term: Able to use RPE daily in rehab to express subjective intensity level;Long Term:  Able to use RPE to guide intensity level when exercising independently       Knowledge and understanding of Target Heart Rate Range (THRR) Yes       Intervention Provide education and explanation of THRR including how the numbers were predicted and where they are located for reference       Expected Outcomes Short Term: Able to state/look up THRR;Short Term: Able to use daily as guideline for intensity in rehab;Long Term: Able to use THRR to govern intensity when exercising independently       Understanding of Exercise Prescription Yes       Intervention Provide education, explanation, and written materials on patient's individual exercise prescription       Expected Outcomes Short Term: Able to explain program exercise prescription;Long Term: Able to explain home exercise prescription to exercise independently                Exercise Goals Re-Evaluation :  Exercise Goals Re-Evaluation     Row Name 02/15/22 1624             Exercise Goal Re-Evaluation   Exercise Goals Review Increase Physical Activity;Increase Strength and Stamina;Able to understand and use rate of perceived exertion (RPE) scale;Knowledge and understanding of Target Heart Rate Range (THRR);Understanding of Exercise Prescription       Comments Pt's first day in the [facility] program. Pt understands the exercise Rx, THRR and RPE scale.       Expected Outcomes Will continue to monitor and progress exercise workloads as tolerated.                Discharge Exercise Prescription (Final Exercise Prescription Changes):  Exercise Prescription Changes - 02/15/22 1600       Response to Exercise   Blood Pressure (Admit) 114/60    Blood Pressure (Exercise) 118/62    Blood Pressure (Exit)  106/78    Heart Rate (Admit) 52 bpm    Heart Rate (Exercise) 77 bpm    Heart Rate (Exit) 51 bpm    Rating of Perceived Exertion (Exercise) 11.5  Symptoms None    Comments Pt's first day in the [facility] program    Duration Continue with 30 min of aerobic exercise without signs/symptoms of physical distress.    Intensity THRR unchanged      Progression   Progression Continue to progress workloads to maintain intensity without signs/symptoms of physical distress.    Average METs 2.3      Resistance Training   Training Prescription Yes    Weight 2 lbs    Reps 10-15    Time 10 Minutes      Interval Training   Interval Training No      NuStep   Level 1    SPM 76    Minutes 35    METs 2.3             Nutrition:  Target Goals: Understanding of nutrition guidelines, daily intake of sodium '1500mg'$ , cholesterol '200mg'$ , calories 30% from fat and 7% or less from saturated fats, daily to have 5 or more servings of fruits and vegetables.  Biometrics:  Pre Biometrics - 02/11/22 1125       Pre Biometrics   Waist Circumference 42.75 inches    Hip Circumference 51 inches    Waist to Hip Ratio 0.84 %    Triceps Skinfold 30 mm    % Body Fat 47.3 %    Grip Strength 20 kg    Flexibility --   No done due to significant back issues   Single Leg Stand 3.56 seconds              Nutrition Therapy Plan and Nutrition Goals:  Nutrition Therapy & Goals - 02/15/22 1332       Nutrition Therapy   Diet Heart Healthy Diet    Drug/Food Interactions Statins/Certain Fruits      Personal Nutrition Goals   Nutrition Goal Patient to choose a variety of fruits, vegetables, whole grains, lean protein/plant protein, and nonfat dairy daily as part of heart meal plan    Personal Goal #2 Patient to reduce food frequency of and amount of refined carbohydrates and simple sugars in diet daily.      Intervention Plan   Intervention Prescribe, educate and counsel regarding individualized specific  dietary modifications aiming towards targeted core components such as weight, hypertension, lipid management, diabetes, heart failure and other comorbidities.;Nutrition handout(s) given to patient.    Expected Outcomes Short Term Goal: Understand basic principles of dietary content, such as calories, fat, sodium, cholesterol and nutrients.;Short Term Goal: A plan has been developed with personal nutrition goals set during dietitian appointment.;Long Term Goal: Adherence to prescribed nutrition plan.             Nutrition Assessments:  Nutrition Assessments - 02/15/22 1331       Rate Your Plate Scores   Pre Score 71            MEDIFICTS Score Key: ?70 Need to make dietary changes  40-70 Heart Healthy Diet ? 40 Therapeutic Level Cholesterol Diet   Flowsheet Row INTENSIVE CARDIAC REHAB from 02/15/2022 in [place]  Picture Your Plate Total Score on Admission 71      Picture Your Plate Scores: <86 Unhealthy dietary pattern with much room for improvement. 41-50 Dietary pattern unlikely to meet recommendations for good health and room for improvement. 51-60 More healthful dietary pattern, with some room for improvement.  >60 Healthy dietary pattern, although there may be some specific behaviors that could be improved.  Nutrition Goals Re-Evaluation:   Nutrition Goals Re-Evaluation:   Nutrition Goals Discharge (Final Nutrition Goals Re-Evaluation):   Psychosocial: Target Goals: Acknowledge presence or absence of significant depression and/or stress, maximize coping skills, provide positive support system. Participant is able to verbalize types and ability to use techniques and skills needed for reducing stress and depression.  Initial Review & Psychosocial Screening:  Initial Psych Review & Screening - 02/11/22 1542       Initial Review   Current issues with Current Stress Concerns    Source of Stress Concerns Family    Comments  Shelsy and her husband Simona Huh live with her elderly mother in law which can be stressful at times      [place]? Yes   Raeford Razor has her husband and her two children for support     Barriers   Psychosocial barriers to participate in program The patient should benefit from training in stress management and relaxation.      Screening Interventions   Interventions Encouraged to exercise    Expected Outcomes Long Term Goal: Stressors or current issues are controlled or eliminated.             Quality of Life Scores:  Quality of Life - 02/11/22 1419       Quality of Life   Select Quality of Life      Quality of Life Scores   Health/Function Pre 24.1 %    Socioeconomic Pre 23.5 %    Psych/Spiritual Pre 29.14 %    Family Pre 26.4 %    GLOBAL Pre 25.3 %            Scores of 19 and below usually indicate a poorer quality of life in these areas.  A difference of  2-3 points is a clinically meaningful difference.  A difference of 2-3 points in the total score of the Quality of Life Index has been associated with significant improvement in overall quality of life, self-image, physical symptoms, and general health in studies assessing change in quality of life.  PHQ-9: Review Flowsheet       02/11/2022 10/12/2021 06/21/2018 03/10/2017  Depression screen PHQ 2/9  Decreased Interest 0 0 0 0 0  Down, Depressed, Hopeless 0 0 0 0 0  PHQ - 2 Score 0 0 0 0 0  Altered sleeping - 0 - 0  Tired, decreased energy - 0 - 0  Change in appetite - 0 - 0  Feeling bad or failure about yourself  - 0 - 0  Trouble concentrating - 0 - 0  Moving slowly or fidgety/restless - 0 - 0  Suicidal thoughts - 0 - 0  PHQ-9 Score - 0 - 0  Difficult doing work/chores - Not difficult at all - -   Interpretation of Total Score  Total Score Depression Severity:  1-4 = Minimal depression, 5-9 = Mild depression, 10-14 = Moderate depression, 15-19 = Moderately severe depression, 20-27 =  Severe depression   Psychosocial Evaluation and Intervention:   Psychosocial Re-Evaluation:  Psychosocial Re-Evaluation     Row Name 02/16/22 0743 02/25/22 0808           Psychosocial Re-Evaluation   Current issues with Current Stress Concerns Current Stress Concerns      Comments Maureen did not voice any increased concerns or stressors on her first day of exercise Zollie has not  voiced any increased concerns or stressors during exercise at intensive cardiac rehab  Expected Outcomes Mearl will have decreased or controlled stress upon completion of phase 2 cardiac rehab Arra will have decreased or controlled stress upon completion of phase 2 cardiac rehab      Interventions Encouraged to attend Cardiac Rehabilitation for the exercise;Stress management education;Relaxation education Encouraged to attend Cardiac Rehabilitation for the exercise;Stress management education;Relaxation education      Continue Psychosocial Services  Follow up required by staff Follow up required by staff        Initial Review   Source of Stress Concerns Family Family      Comments Will continue to monitor and offer support as needed Will continue to monitor and offer support as needed               Psychosocial Discharge (Final Psychosocial Re-Evaluation):  Psychosocial Re-Evaluation - 02/25/22 0808       Psychosocial Re-Evaluation   Current issues with Current Stress Concerns    Comments Irma has not  voiced any increased concerns or stressors during exercise at intensive cardiac rehab    Expected Kief will have decreased or controlled stress upon completion of phase 2 cardiac rehab    Interventions Encouraged to attend Cardiac Rehabilitation for the exercise;Stress management education;Relaxation education    Continue Psychosocial Services  Follow up required by staff      Initial Review   Source of Stress Concerns Family    Comments Will continue to monitor and offer support  as needed             Vocational Rehabilitation: Provide vocational rehab assistance to qualifying candidates.   Vocational Rehab Evaluation & Intervention:  Vocational Rehab - 02/11/22 1544       Initial Vocational Rehab Evaluation & Intervention   Assessment shows need for Vocational Rehabilitation No   Chantille is retired and does not need vocational rehab at this time            Education: Education Goals: Education classes will be provided on a weekly basis, covering required topics. Participant will state understanding/return demonstration of topics presented.    Education - 02/24/22 1500       Education   Cardiac Education Topics Pritikin    Sales executive    Weekly Topic Satisfying Salads and Dressings    Instruction Review Code 1- Verbalizes Understanding    Class Start Time 1345    Class Stop Time 1445    Class Time Calculation (min) 60 min             Core Videos: Exercise    Move It!  Clinical staff conducted group or individual video education with verbal and written material and guidebook.  Patient learns the recommended Pritikin exercise program. Exercise with the goal of living a long, healthy life. Some of the health benefits of exercise include controlled diabetes, healthier blood pressure levels, improved cholesterol levels, improved heart and lung capacity, improved sleep, and better body composition. Everyone should speak with their doctor before starting or changing an exercise routine.  Biomechanical Limitations Clinical staff conducted group or individual video education with verbal and written material and guidebook.  Patient learns how biomechanical limitations can impact exercise and how we can mitigate and possibly overcome limitations to have an impactful and balanced exercise routine.  Body Composition Clinical staff conducted group or individual video education with verbal and  written material and guidebook.  Patient learns that body composition (ratio of  muscle mass to fat mass) is a key component to assessing overall fitness, rather than body weight alone. Increased fat mass, especially visceral belly fat, can put [place] at increased risk for metabolic syndrome, type 2 diabetes, heart disease, and even death. It is recommended to combine diet and exercise (cardiovascular and resistance training) to improve your body composition. Seek guidance from your physician and exercise physiologist before implementing an exercise routine.  Exercise Action Plan Clinical staff conducted group or individual video education with verbal and written material and guidebook.  Patient learns the recommended strategies to achieve and enjoy long-term exercise adherence, including variety, self-motivation, self-efficacy, and positive decision making. Benefits of exercise include fitness, good health, weight management, more energy, better sleep, less stress, and overall well-being.  Medical   Heart Disease Risk Reduction Clinical staff conducted group or individual video education with verbal and written material and guidebook.  Patient learns our heart is our most vital organ as it circulates oxygen, nutrients, white blood cells, and hormones throughout the entire body, and carries waste away. Data supports a plant-based eating plan like the Pritikin Program for its effectiveness in slowing progression of and reversing heart disease. The video provides a number of recommendations to address heart disease.   Metabolic Syndrome and Belly Fat  Clinical staff conducted group or individual video education with verbal and written material and guidebook.  Patient learns what metabolic syndrome is, how it leads to heart disease, and how one can reverse it and keep it from coming back. You have metabolic syndrome if you have 3 of the following 5 criteria: abdominal obesity, high blood pressure, high  triglycerides, low HDL cholesterol, and high blood sugar.  Hypertension and Heart Disease Clinical staff conducted group or individual video education with verbal and written material and guidebook.  Patient learns that high blood pressure, or hypertension, is very common in the [place]. Hypertension is largely due to excessive salt intake, but other important risk factors include being overweight, physical inactivity, drinking too much alcohol, smoking, and not eating enough potassium from fruits and vegetables. High blood pressure is a leading risk factor for heart attack, stroke, congestive heart failure, dementia, kidney failure, and premature death. Long-term effects of excessive salt intake include stiffening of the arteries and thickening of heart muscle and organ damage. Recommendations include ways to reduce hypertension and the risk of heart disease.  Diseases of Our Time - Focusing on Diabetes Clinical staff conducted group or individual video education with verbal and written material and guidebook.  Patient learns why the best way to stop diseases of our time is prevention, through food and other lifestyle changes. Medicine (such as prescription pills and surgeries) is often only a Band-Aid on the problem, not a long-term solution. Most common diseases of our time include obesity, type 2 diabetes, hypertension, heart disease, and cancer. The Pritikin Program is recommended and has been proven to help reduce, reverse, and/or prevent the damaging effects of metabolic syndrome.  Nutrition   Overview of the Pritikin Eating Plan  Clinical staff conducted group or individual video education with verbal and written material and guidebook.  Patient learns about the [place] for disease risk reduction. The [place] emphasizes a wide variety of unrefined, minimally-processed carbohydrates, like fruits, vegetables, whole grains, and legumes. Go, Caution, and Stop food  choices are explained. Plant-based and lean animal proteins are emphasized. Rationale provided for low sodium intake for blood pressure control, low added sugars for blood sugar stabilization,  and low added fats and oils for coronary artery disease risk reduction and weight management.  Calorie Density  Clinical staff conducted group or individual video education with verbal and written material and guidebook.  Patient learns about calorie density and how it impacts the Pritikin Eating Plan. Knowing the characteristics of the food you choose will help you decide whether those foods will lead to weight gain or weight loss, and whether you want to consume more or less of them. Weight loss is usually a side effect of the Pritikin Eating Plan because of its focus on low calorie-dense foods.  Label Reading  Clinical staff conducted group or individual video education with verbal and written material and guidebook.  Patient learns about the Pritikin recommended label reading guidelines and corresponding recommendations regarding calorie density, added sugars, sodium content, and whole grains.  Dining Out - Part 1  Clinical staff conducted group or individual video education with verbal and written material and guidebook.  Patient learns that restaurant meals can be sabotaging because they can be so high in calories, fat, sodium, and/or sugar. Patient learns recommended strategies on how to positively address this and avoid unhealthy pitfalls.  Facts on Fats  Clinical staff conducted group or individual video education with verbal and written material and guidebook.  Patient learns that lifestyle modifications can be just as effective, if not more so, as many medications for lowering your risk of heart disease. A Pritikin lifestyle can help to reduce your risk of inflammation and atherosclerosis (cholesterol build-up, or plaque, in the artery walls). Lifestyle interventions such as dietary choices and  physical activity address the cause of atherosclerosis. A review of the types of fats and their impact on blood cholesterol levels, along with dietary recommendations to reduce fat intake is also included.  Nutrition Action Plan  Clinical staff conducted group or individual video education with verbal and written material and guidebook.  Patient learns how to incorporate Pritikin recommendations into their lifestyle. Recommendations include planning and keeping personal health goals in mind as an important part of their success.  Healthy Mind-Set    Healthy Minds, Bodies, Hearts  Clinical staff conducted group or individual video education with verbal and written material and guidebook.  Patient learns how to identify when they are stressed. Video will discuss the impact of that stress, as well as the many benefits of stress management. Patient will also be introduced to stress management techniques. The way we think, act, and feel has an impact on our hearts.  How Our Thoughts Can Heal Our Hearts  Clinical staff conducted group or individual video education with verbal and written material and guidebook.  Patient learns that negative thoughts can cause depression and anxiety. This can result in negative lifestyle behavior and serious health problems. Cognitive behavioral therapy is an effective method to help control our thoughts in order to change and improve our emotional outlook.  Additional Videos:  Exercise    Improving Performance  Clinical staff conducted group or individual video education with verbal and written material and guidebook.  Patient learns to use a non-linear approach by alternating intensity levels and lengths of time spent exercising to help burn more calories and lose more body fat. Cardiovascular exercise helps improve heart health, metabolism, hormonal balance, blood sugar control, and recovery from fatigue. Resistance training improves strength, endurance, balance,  coordination, reaction time, metabolism, and muscle mass. Flexibility exercise improves circulation, posture, and balance. Seek guidance from your physician and exercise physiologist before implementing an exercise  routine and learn your capabilities and proper form for all exercise.  Introduction to Yoga  Clinical staff conducted group or individual video education with verbal and written material and guidebook.  Patient learns about yoga, a discipline of the coming together of mind, breath, and body. The benefits of yoga include improved flexibility, improved range of motion, better posture and core strength, increased lung function, weight loss, and positive self-image. Yoga's heart health benefits include lowered blood pressure, healthier heart rate, decreased cholesterol and triglyceride levels, improved immune function, and reduced stress. Seek guidance from your physician and exercise physiologist before implementing an exercise routine and learn your capabilities and proper form for all exercise.  Medical   Aging: Enhancing Your Quality of Life  Clinical staff conducted group or individual video education with verbal and written material and guidebook.  Patient learns key strategies and recommendations to stay in good physical health and enhance quality of life, such as prevention strategies, having an advocate, securing a [place], and keeping a list of medications and system for tracking them. It also discusses how to avoid risk for bone loss.  Biology of Weight Control  Clinical staff conducted group or individual video education with verbal and written material and guidebook.  Patient learns that weight gain occurs because we consume more calories than we burn (eating more, moving less). Even if your body weight is normal, you may have higher ratios of fat compared to muscle mass. Too much body fat puts you at increased risk for cardiovascular disease, heart  attack, stroke, type 2 diabetes, and obesity-related cancers. In addition to exercise, following the [place] can help reduce your risk.  Decoding Lab Results  Clinical staff conducted group or individual video education with verbal and written material and guidebook.  Patient learns that lab test reflects one measurement whose values change over time and are influenced by many factors, including medication, stress, sleep, exercise, food, hydration, pre-existing medical conditions, and more. It is recommended to use the knowledge from this video to become more involved with your lab results and evaluate your numbers to speak with your doctor.   Diseases of Our Time - Overview  Clinical staff conducted group or individual video education with verbal and written material and guidebook.  Patient learns that according to the CDC, 50% to 70% of chronic diseases (such as obesity, type 2 diabetes, elevated lipids, hypertension, and heart disease) are avoidable through lifestyle improvements including healthier food choices, listening to satiety cues, and increased physical activity.  Sleep Disorders Clinical staff conducted group or individual video education with verbal and written material and guidebook.  Patient learns how good quality and duration of sleep are important to overall health and well-being. Patient also learns about sleep disorders and how they impact health along with recommendations to address them, including discussing with a physician.  Nutrition  Dining Out - Part 2 Clinical staff conducted group or individual video education with verbal and written material and guidebook.  Patient learns how to plan ahead and communicate in order to maximize their dining experience in a healthy and nutritious manner. Included are recommended food choices based on the type of restaurant the patient is visiting.   Fueling a Best boy conducted group or individual  video education with verbal and written material and guidebook.  There is a strong connection between our food choices and our health. Diseases like obesity and type 2 diabetes are very prevalent  and are in large-part due to lifestyle choices. The Pritikin Eating Plan provides plenty of food and hunger-curbing satisfaction. It is easy to follow, affordable, and helps reduce health risks.  Menu Workshop  Clinical staff conducted group or individual video education with verbal and written material and guidebook.  Patient learns that restaurant meals can sabotage health goals because they are often packed with calories, fat, sodium, and sugar. Recommendations include strategies to plan ahead and to communicate with the manager, chef, or server to help order a healthier meal.  Planning Your Eating Strategy  Clinical staff conducted group or individual video education with verbal and written material and guidebook.  Patient learns about the [place] and its benefit of reducing the risk of disease. The [place] does not focus on calories. Instead, it emphasizes high-quality, nutrient-rich foods. By knowing the characteristics of the foods, we choose, we can determine their calorie density and make informed decisions.  Targeting Your Nutrition Priorities  Clinical staff conducted group or individual video education with verbal and written material and guidebook.  Patient learns that lifestyle habits have a tremendous impact on disease risk and progression. This video provides eating and physical activity recommendations based on your personal health goals, such as reducing LDL cholesterol, losing weight, preventing or controlling type 2 diabetes, and reducing high blood pressure.  Vitamins and Minerals  Clinical staff conducted group or individual video education with verbal and written material and guidebook.  Patient learns different ways to obtain key vitamins and minerals,  including through a recommended healthy diet. It is important to discuss all supplements you take with your doctor.   Healthy Mind-Set    Smoking Cessation  Clinical staff conducted group or individual video education with verbal and written material and guidebook.  Patient learns that cigarette smoking and tobacco addiction pose a serious health risk which affects millions of people. Stopping smoking will significantly reduce the risk of heart disease, lung disease, and many forms of cancer. Recommended strategies for quitting are covered, including working with your doctor to develop a successful plan.  Culinary   Becoming a Financial trader conducted group or individual video education with verbal and written material and guidebook.  Patient learns that cooking at home can be healthy, cost-effective, quick, and puts them in control. Keys to cooking healthy recipes will include looking at your recipe, assessing your equipment needs, planning ahead, making it simple, choosing cost-effective seasonal ingredients, and limiting the use of added fats, salts, and sugars.  Cooking - Breakfast and Snacks  Clinical staff conducted group or individual video education with verbal and written material and guidebook.  Patient learns how important breakfast is to satiety and nutrition through the entire day. Recommendations include key foods to eat during breakfast to help stabilize blood sugar levels and to prevent overeating at meals later in the day. Planning ahead is also a key component.  Cooking - Human resources officer conducted group or individual video education with verbal and written material and guidebook.  Patient learns eating strategies to improve overall health, including an approach to cook more at home. Recommendations include thinking of animal protein as a side on your plate rather than center stage and focusing instead on lower calorie dense options like vegetables,  fruits, whole grains, and plant-based proteins, such as beans. Making sauces in large quantities to freeze for later and leaving the skin on your vegetables are also recommended to maximize your experience.  Cooking - Healthy Salads and Dressing Clinical staff conducted group or individual video education with verbal and written material and guidebook.  Patient learns that vegetables, fruits, whole grains, and legumes are the foundations of the [place]. Recommendations include how to incorporate each of these in flavorful and healthy salads, and how to create homemade salad dressings. Proper handling of ingredients is also covered. Cooking - Soups and [employer] - Soups and Desserts Clinical staff conducted group or individual video education with verbal and written material and guidebook.  Patient learns that Pritikin soups and desserts make for easy, nutritious, and delicious snacks and meal components that are low in sodium, fat, sugar, and calorie density, while high in vitamins, minerals, and filling fiber. Recommendations include simple and healthy ideas for soups and desserts.   Overview     The Pritikin Solution Program Overview Clinical staff conducted group or individual video education with verbal and written material and guidebook.  Patient learns that the results of the [place] Program have been documented in more than 100 articles published in peer-reviewed journals, and the benefits include reducing risk factors for (and, in some cases, even reversing) high cholesterol, high blood pressure, type 2 diabetes, obesity, and more! An overview of the three key pillars of the Pritikin Program will be covered: eating well, doing regular exercise, and having a healthy mind-set.  WORKSHOPS  Exercise: Exercise Basics: Building Your Action Plan Clinical staff led group instruction and group discussion with PowerPoint presentation and patient guidebook. To enhance the  learning environment the use of posters, models and videos may be added. At the conclusion of this workshop, patients will comprehend the difference between physical activity and exercise, as well as the benefits of incorporating both, into their routine. Patients will understand the FITT (Frequency, Intensity, Time, and Type) principle and how to use it to build an exercise action plan. In addition, safety concerns and other considerations for exercise and cardiac rehab will be addressed by the presenter. The purpose of this lesson is to promote a comprehensive and effective weekly exercise routine in order to improve patients' overall level of fitness.   Managing Heart Disease: Your Path to a Healthier Heart Clinical staff led group instruction and group discussion with PowerPoint presentation and patient guidebook. To enhance the learning environment the use of posters, models and videos may be added.At the conclusion of this workshop, patients will understand the anatomy and physiology of the heart. Additionally, they will understand how Pritikin's three pillars impact the risk factors, the progression, and the management of heart disease.  The purpose of this lesson is to provide a high-level overview of the heart, heart disease, and how the Pritikin lifestyle positively impacts risk factors.  Exercise Biomechanics Clinical staff led group instruction and group discussion with PowerPoint presentation and patient guidebook. To enhance the learning environment the use of posters, models and videos may be added. Patients will learn how the structural parts of their bodies function and how these functions impact their daily activities, movement, and exercise. Patients will learn how to promote a neutral spine, learn how to manage pain, and identify ways to improve their physical movement in order to promote healthy living. The purpose of this lesson is to expose patients to common  physical limitations that impact physical activity. Participants will learn practical ways to adapt and manage aches and pains, and to minimize their effect on regular exercise. Patients will learn how to maintain good posture while sitting, walking, and  lifting.  Balance Training and Fall Prevention  Clinical staff led group instruction and group discussion with PowerPoint presentation and patient guidebook. To enhance the learning environment the use of posters, models and videos may be added. At the conclusion of this workshop, patients will understand the importance of their sensorimotor skills (vision, proprioception, and the vestibular system) in maintaining their ability to balance as they age. Patients will apply a variety of balancing exercises that are appropriate for their current level of function. Patients will understand the common causes for poor balance, possible solutions to these problems, and ways to modify their physical environment in order to minimize their fall risk. The purpose of this lesson is to teach patients about the importance of maintaining balance as they age and ways to minimize their risk of falling.  WORKSHOPS   Nutrition:  Fueling a Scientist, research (physical sciences) led group instruction and group discussion with PowerPoint presentation and patient guidebook. To enhance the learning environment the use of posters, models and videos may be added. Patients will review the foundational principles of the [place] and understand what constitutes a serving size in each of the food groups. Patients will also learn Pritikin-friendly foods that are better choices when away from home and review make-ahead meal and snack options. Calorie density will be reviewed and applied to three nutrition priorities: weight maintenance, weight loss, and weight gain. The purpose of this lesson is to reinforce (in a group setting) the key concepts around what patients are  recommended to eat and how to apply these guidelines when away from home by planning and selecting Pritikin-friendly options. Patients will understand how calorie density may be adjusted for different weight management goals.  Mindful Eating  Clinical staff led group instruction and group discussion with PowerPoint presentation and patient guidebook. To enhance the learning environment the use of posters, models and videos may be added. Patients will briefly review the concepts of the [place] and the importance of low-calorie dense foods. The concept of mindful eating will be introduced as well as the importance of paying attention to internal hunger signals. Triggers for non-hunger eating and techniques for dealing with triggers will be explored. The purpose of this lesson is to provide patients with the opportunity to review the basic principles of the [place], discuss the value of eating mindfully and how to measure internal cues of hunger and fullness using the Hunger Scale. Patients will also discuss reasons for non-hunger eating and learn strategies to use for controlling emotional eating.  Targeting Your Nutrition Priorities Clinical staff led group instruction and group discussion with PowerPoint presentation and patient guidebook. To enhance the learning environment the use of posters, models and videos may be added. Patients will learn how to determine their genetic susceptibility to disease by reviewing their family history. Patients will gain insight into the importance of diet as part of an overall healthy lifestyle in mitigating the impact of genetics and other environmental insults. The purpose of this lesson is to provide patients with the opportunity to assess their personal nutrition priorities by looking at their family history, their own health history and current risk factors. Patients will also be able to discuss ways of prioritizing and modifying the [place] for their highest risk areas  Menu  Clinical staff led group instruction and group discussion with PowerPoint presentation and patient guidebook. To enhance the learning environment the use of posters, models and videos may be added. Using menus  brought in from [employer], or printed from [employer], patients will apply the [place] dining out guidelines that were presented in the [employer] video. Patients will also be able to practice these guidelines in a variety of provided scenarios. The purpose of this lesson is to provide patients with the opportunity to practice hands-on learning of the [place] with actual menus and practice scenarios.  Label Reading Clinical staff led group instruction and group discussion with PowerPoint presentation and patient guidebook. To enhance the learning environment the use of posters, models and videos may be added. Patients will review and discuss the Pritikin label reading guidelines presented in Pritikin's Label Reading Educational series video. Using fool labels brought in from local grocery stores and markets, patients will apply the label reading guidelines and determine if the packaged food meet the Pritikin guidelines. The purpose of this lesson is to provide patients with the opportunity to review, discuss, and practice hands-on learning of the Pritikin Label Reading guidelines with actual packaged food labels. [place] Workshops are designed to teach patients ways to prepare quick, simple, and affordable recipes at home. The importance of nutrition's role in chronic disease risk reduction is reflected in its emphasis in the overall Pritikin program. By learning how to prepare essential core Pritikin Eating Plan recipes, patients will increase control over what they eat; be able to customize the flavor of foods without the use of added salt, sugar, or fat; and  improve the quality of the food they consume. By learning a set of core recipes which are easily assembled, quickly prepared, and affordable, patients are more likely to prepare more healthy foods at home. These workshops focus on convenient breakfasts, simple entres, side dishes, and desserts which can be prepared with minimal effort and are consistent with nutrition recommendations for cardiovascular risk reduction. Cooking [employer] are taught by a Engineer, materials (RD) who has been trained by the [employer]. The chef or RD has a clear understanding of the importance of minimizing - if not completely eliminating - added fat, sugar, and sodium in recipes. Throughout the series of [place] Workshop sessions, patients will learn about healthy ingredients and efficient methods of cooking to build confidence in their capability to prepare    Cooking School weekly topics:  Adding Flavor- Sodium-Free  Fast and Healthy Breakfasts  Powerhouse Plant-Based Proteins  Satisfying Salads and Dressings  Simple Sides and Sauces  International Cuisine-Spotlight on the [place] Zones  Delicious Desserts  Savory Soups  Efficiency Cooking - Meals in a Snap  Tasty Appetizers and Snacks  Comforting Weekend Breakfasts  One-Pot Wonders   Fast Evening Meals  Easy [place] (Psychosocial): New Thoughts, New Behaviors Clinical staff led group instruction and group discussion with PowerPoint presentation and patient guidebook. To enhance the learning environment the use of posters, models and videos may be added. Patients will learn and practice techniques for developing effective health and lifestyle goals. Patients will be able to effectively apply the goal setting process learned to develop at least one new personal goal.  The purpose of this lesson is to expose patients to a new skill set of behavior  modification techniques such as techniques setting SMART goals, overcoming barriers, and achieving new thoughts and new behaviors.  Managing Moods and Relationships Clinical staff led group instruction and group discussion with PowerPoint presentation and patient guidebook.  To enhance the learning environment the use of posters, models and videos may be added. Patients will learn how emotional and chronic stress factors can impact their health and relationships. They will learn healthy ways to manage their moods and utilize positive coping mechanisms. In addition, ICR patients will learn ways to improve communication skills. The purpose of this lesson is to expose patients to ways of understanding how one's mood and health are intimately connected. Developing a healthy outlook can help build positive relationships and connections with others. Patients will understand the importance of utilizing effective communication skills that include actively listening and being heard. They will learn and understand the importance of the "4 Cs" and especially Connections in fostering of a Healthy Mind-Set.  Healthy Sleep for a Healthy Heart Clinical staff led group instruction and group discussion with PowerPoint presentation and patient guidebook. To enhance the learning environment the use of posters, models and videos may be added. At the conclusion of this workshop, patients will be able to demonstrate knowledge of the importance of sleep to overall health, well-being, and quality of life. They will understand the symptoms of, and treatments for, common sleep disorders. Patients will also be able to identify daytime and nighttime behaviors which impact sleep, and they will be able to apply these tools to help manage sleep-related challenges. The purpose of this lesson is to provide patients with a general overview of sleep and outline the importance of quality sleep. Patients will learn about a few of the most common  sleep disorders. Patients will also be introduced to the concept of "sleep hygiene," and discover ways to self-manage certain sleeping problems through simple daily behavior changes. Finally, the workshop will motivate patients by clarifying the links between quality sleep and their goals of heart-healthy living.   Recognizing and Reducing Stress Clinical staff led group instruction and group discussion with PowerPoint presentation and patient guidebook. To enhance the learning environment the use of posters, models and videos may be added. At the conclusion of this workshop, patients will be able to understand the types of stress reactions, differentiate between acute and chronic stress, and recognize the impact that chronic stress has on their health. They will also be able to apply different coping mechanisms, such as reframing negative self-talk. Patients will have the opportunity to practice a variety of stress management techniques, such as deep abdominal breathing, progressive muscle relaxation, and/or guided imagery.  The purpose of this lesson is to educate patients on the role of stress in their lives and to provide healthy techniques for coping with it.  Learning Barriers/Preferences:  Learning Barriers/Preferences - 02/11/22 1420       Learning Barriers/Preferences   Learning Barriers Sight   wears glasses   Learning Preferences Audio;Computer/Internet;Group Instruction;Individual Instruction;Pictoral;Skilled Demonstration;Verbal Instruction;Video             Education Topics:  Knowledge Questionnaire Score:  Knowledge Questionnaire Score - 02/11/22 1419       Knowledge Questionnaire Score   Pre Score 21/24             Core Components/Risk Factors/Patient Goals at Admission:  Personal Goals and Risk Factors at Admission - 02/11/22 1422       Core Components/Risk Factors/Patient Goals on Admission    Weight Management Yes;Weight Loss    Intervention Weight  Management: Develop a combined nutrition and exercise program designed to reach desired caloric intake, while maintaining appropriate intake of nutrient and fiber, sodium and fats, and appropriate energy expenditure required for  the weight goal.;Weight Management: Provide education and appropriate resources to help participant work on and attain dietary goals.;Weight Management/Obesity: Establish reasonable short term and long term weight goals.;Obesity: Provide education and appropriate resources to help participant work on and attain dietary goals.    Admit Weight 181 lb 3.5 oz (82.2 kg)    Expected Outcomes Short Term: Continue to assess and modify interventions until short term weight is achieved;Long Term: Adherence to nutrition and physical activity/exercise program aimed toward attainment of established weight goal;Weight Maintenance: Understanding of the daily nutrition guidelines, which includes 25-35% calories from fat, 7% or less cal from saturated fats, less than '200mg'$  cholesterol, less than 1.5gm of sodium, & 5 or more servings of fruits and vegetables daily;Weight Loss: Understanding of general recommendations for a balanced deficit meal plan, which promotes 1-2 lb weight loss per week and includes a negative energy balance of [phone] kcal/d;Understanding recommendations for meals to include 15-35% energy as protein, 25-35% energy from fat, 35-60% energy from carbohydrates, less than '200mg'$  of dietary cholesterol, 20-35 gm of total fiber daily;Understanding of distribution of calorie intake throughout the day with the consumption of 4-5 meals/snacks    Hypertension Yes    Intervention Provide education on lifestyle modifcations including regular physical activity/exercise, weight management, moderate sodium restriction and increased consumption of fresh fruit, vegetables, and low fat dairy, alcohol moderation, and smoking cessation.;Monitor prescription use compliance.    Expected Outcomes Short  Term: Continued assessment and intervention until BP is < 140/40m HG in hypertensive participants. < 130/811mHG in hypertensive participants with diabetes, heart failure or chronic kidney disease.;Long Term: Maintenance of blood pressure at goal levels.    Lipids Yes    Intervention Provide education and support for participant on nutrition & aerobic/resistive exercise along with prescribed medications to achieve LDL '70mg'$ , HDL >'40mg'$ .    Expected Outcomes Short Term: Participant states understanding of desired cholesterol values and is compliant with medications prescribed. Participant is following exercise prescription and nutrition guidelines.;Long Term: Cholesterol controlled with medications as prescribed, with individualized exercise RX and with personalized nutrition plan. Value goals: LDL < '70mg'$ , HDL > 40 mg.    Stress Yes    Intervention Offer individual and/or small group education and counseling on adjustment to heart disease, stress management and health-related lifestyle change. Teach and support self-help strategies.;Refer participants experiencing significant psychosocial distress to appropriate mental health specialists for further evaluation and treatment. When possible, include family members and significant others in education/counseling sessions.    Expected Outcomes Short Term: Participant demonstrates changes in health-related behavior, relaxation and other stress management skills, ability to obtain effective social support, and compliance with psychotropic medications if prescribed.;Long Term: Emotional wellbeing is indicated by absence of clinically significant psychosocial distress or social isolation.             Core Components/Risk Factors/Patient Goals Review:   Goals and Risk Factor Review     Row Name 02/17/22 0835 02/25/22 0809           Core Components/Risk Factors/Patient Goals Review   Personal Goals Review Weight  Management/Obesity;Hypertension;Lipids;Stress Weight Management/Obesity;Hypertension;Lipids;Stress      Review [facility]ahnietarted cardiac rehab on 02/15/22. Janette did well with exercise. Vital signs stable BoBreeonnas off to a good start to exercise at intensive cardiac rehab. Vital signs have been stable      Expected Outcomes BoChiamakaill continue to participate in phase 2 cardiac rehab for exercise, nutrition and lifestyle modifications BoJoniceill continue to participate in phase 2 cardiac  rehab for exercise, nutrition and lifestyle modifications               Core Components/Risk Factors/Patient Goals at Discharge (Final Review):   Goals and Risk Factor Review - 02/25/22 0809       Core Components/Risk Factors/Patient Goals Review   Personal Goals Review Weight Management/Obesity;Hypertension;Lipids;Stress    Review Tamelia is off to a good start to exercise at intensive cardiac rehab. Vital signs have been stable    Expected Outcomes Gabryel will continue to participate in phase 2 cardiac rehab for exercise, nutrition and lifestyle modifications             ITP Comments:  ITP Comments     Row Name 02/11/22 1537 02/16/22 0741 02/25/22 0806       ITP Comments Dr Fransico Him MD, Medical Director. Introduction to [facility] program. Initial Resource packet reviewed with the patient and her husband. 30 Day ITP Review. Jolayne started cardiac rehab on 02/15/22 and did well with exercise. 30 Day ITP Review. Adasha is off to a good start to exercise at intensive cardiac rehab              Comments: See ITP comments

## 2022-02-26 ENCOUNTER — Encounter (HOSPITAL_COMMUNITY)
Admission: RE | Admit: 2022-02-26 | Discharge: 2022-02-26 | Disposition: A | Discharge status: Home / Self Care | Payer: Medicare Other | Source: Ambulatory Visit | Attending: Interventional Cardiology | Admitting: Interventional Cardiology

## 2022-02-26 DIAGNOSIS — Z955 Presence of coronary angioplasty implant and graft: Secondary | ICD-10-CM | POA: Diagnosis not present

## 2022-03-01 ENCOUNTER — Other Ambulatory Visit: Payer: Self-pay

## 2022-03-01 ENCOUNTER — Encounter (HOSPITAL_COMMUNITY)
Admission: RE | Admit: 2022-03-01 | Discharge: 2022-03-01 | Disposition: A | Discharge status: Home / Self Care | Payer: Medicare Other | Source: Ambulatory Visit | Attending: Interventional Cardiology | Admitting: Interventional Cardiology

## 2022-03-01 DIAGNOSIS — Z955 Presence of coronary angioplasty implant and graft: Secondary | ICD-10-CM | POA: Insufficient documentation

## 2022-03-03 ENCOUNTER — Encounter (HOSPITAL_COMMUNITY)
Admission: RE | Admit: 2022-03-03 | Discharge: 2022-03-03 | Disposition: A | Discharge status: Home / Self Care | Payer: Medicare Other | Source: Ambulatory Visit | Attending: Interventional Cardiology | Admitting: Interventional Cardiology

## 2022-03-03 DIAGNOSIS — Z955 Presence of coronary angioplasty implant and graft: Secondary | ICD-10-CM | POA: Diagnosis not present

## 2022-03-05 ENCOUNTER — Encounter (HOSPITAL_COMMUNITY): Payer: Medicare Other

## 2022-03-05 ENCOUNTER — Telehealth: Payer: Self-pay | Admitting: Student

## 2022-03-05 ENCOUNTER — Encounter (HOSPITAL_COMMUNITY)
Admission: RE | Admit: 2022-03-05 | Discharge: 2022-03-05 | Disposition: A | Discharge status: Home / Self Care | Payer: Medicare Other | Source: Ambulatory Visit | Attending: Interventional Cardiology | Admitting: Interventional Cardiology

## 2022-03-05 ENCOUNTER — Telehealth: Payer: Self-pay | Admitting: Interventional Cardiology

## 2022-03-05 DIAGNOSIS — Z955 Presence of coronary angioplasty implant and graft: Secondary | ICD-10-CM | POA: Diagnosis not present

## 2022-03-05 MED ORDER — METOPROLOL TARTRATE 50 MG PO TABS: ORAL_TABLET | ORAL | 3 refills | Status: DC

## 2022-03-05 NOTE — Telephone Encounter (Signed)
   Received call from [place] about concerns for low heart rate. Patient's heart rate was in the 40s on arrival to cardiac rehab. Rates have improved to the 70s with exercise. BP stable. Patient is completely asymptomatic. Patient currently takes Lopressor '50mg'$  in the morning, '25mg'$  at lunch, and '50mg'$  in the evening. Advised patient to stop lunch time does and continue morning and evening dose. Also asked patient to monitor BP and HR at home with this change and notify us if heart rates is staying below 50 bpm. Patient voiced understanding and agreed.  Darreld Mclean, PA-C 03/05/2022 9:41 AM

## 2022-03-05 NOTE — Telephone Encounter (Signed)
Calling to say that are sending patient results over. Please advise

## 2022-03-05 NOTE — Progress Notes (Signed)
Patient's resting heart rate noted at 42 nonsustained. Patient asymptomatic. Entry blood pressure 114/72. Blood pressure 124/68 on the nustep heart rate 70. Sande Rives PAC called and notified. Callie spoke with the patient over the phone. Decreased Metoprolol to 50 mg twice a day. Patient instructed to omit lunch time dose. Callie told [place] to keep a log of her blood pressures at home and to call the office for sustained heart rate less than 50. Will continue to monitor the patient throughout  the program. Will fax today's ECG tracings  to Dr. Hassell Done office for review.Harrell Gave RN BSN

## 2022-03-08 ENCOUNTER — Other Ambulatory Visit: Payer: Self-pay

## 2022-03-08 ENCOUNTER — Encounter (HOSPITAL_COMMUNITY)
Admission: RE | Admit: 2022-03-08 | Discharge: 2022-03-08 | Disposition: A | Discharge status: Home / Self Care | Payer: Medicare Other | Source: Ambulatory Visit | Attending: Interventional Cardiology | Admitting: Interventional Cardiology

## 2022-03-08 DIAGNOSIS — Z955 Presence of coronary angioplasty implant and graft: Secondary | ICD-10-CM

## 2022-03-08 NOTE — Progress Notes (Signed)
Reviewed home exercise Rx with patient today.  Encouraged warm-up, cool-down, and stretching. Reviewed THRR of  60-120 and keeping RPE between 11-13. Encouraged to hydrate with activity.  Reviewed weather parameters for temperature and humidity for safe exercise outdoors. Reviewed S/S to terminate exercise and when to call 911 vs MD. Reviewed the use of NTG and pt was encouraged to carry at all times. Pt encouraged to always carry a cell phone for safety when exercising outdoors. Pt verbalized understanding of the home exercise Rx and was provided a copy.   Lesly Rubenstein MS, ACSM-CEP, CCRP

## 2022-03-10 ENCOUNTER — Encounter (HOSPITAL_COMMUNITY)
Admission: RE | Admit: 2022-03-10 | Discharge: 2022-03-10 | Disposition: A | Discharge status: Home / Self Care | Payer: Medicare Other | Source: Ambulatory Visit | Attending: Interventional Cardiology | Admitting: Interventional Cardiology

## 2022-03-10 DIAGNOSIS — Z955 Presence of coronary angioplasty implant and graft: Secondary | ICD-10-CM | POA: Diagnosis not present

## 2022-03-11 NOTE — Telephone Encounter (Signed)
Addressed by Sande Rives, PA in previous phone note.  Strips reviewed by Dr Irish Lack and sent to medical records to be scanned into chart.

## 2022-03-12 ENCOUNTER — Encounter (HOSPITAL_COMMUNITY)
Admission: RE | Admit: 2022-03-12 | Discharge: 2022-03-12 | Disposition: A | Discharge status: Home / Self Care | Payer: Medicare Other | Source: Ambulatory Visit | Attending: Interventional Cardiology | Admitting: Interventional Cardiology

## 2022-03-12 DIAGNOSIS — Z955 Presence of coronary angioplasty implant and graft: Secondary | ICD-10-CM

## 2022-03-15 ENCOUNTER — Encounter (HOSPITAL_COMMUNITY)
Admission: RE | Admit: 2022-03-15 | Discharge: 2022-03-15 | Disposition: A | Discharge status: Home / Self Care | Payer: Medicare Other | Source: Ambulatory Visit | Attending: Interventional Cardiology | Admitting: Interventional Cardiology

## 2022-03-15 DIAGNOSIS — Z955 Presence of coronary angioplasty implant and graft: Secondary | ICD-10-CM | POA: Diagnosis not present

## 2022-03-17 ENCOUNTER — Encounter (HOSPITAL_COMMUNITY)
Admission: RE | Admit: 2022-03-17 | Discharge: 2022-03-17 | Disposition: A | Discharge status: Home / Self Care | Payer: Medicare Other | Source: Ambulatory Visit | Attending: Interventional Cardiology | Admitting: Interventional Cardiology

## 2022-03-17 DIAGNOSIS — Z955 Presence of coronary angioplasty implant and graft: Secondary | ICD-10-CM | POA: Diagnosis not present

## 2022-03-19 ENCOUNTER — Encounter (HOSPITAL_COMMUNITY)
Admission: RE | Admit: 2022-03-19 | Discharge: 2022-03-19 | Disposition: A | Discharge status: Home / Self Care | Payer: Medicare Other | Source: Ambulatory Visit | Attending: Interventional Cardiology | Admitting: Interventional Cardiology

## 2022-03-19 DIAGNOSIS — Z955 Presence of coronary angioplasty implant and graft: Secondary | ICD-10-CM

## 2022-03-22 ENCOUNTER — Encounter (HOSPITAL_COMMUNITY)
Admission: RE | Admit: 2022-03-22 | Discharge: 2022-03-22 | Disposition: A | Discharge status: Home / Self Care | Payer: Medicare Other | Source: Ambulatory Visit | Attending: Interventional Cardiology | Admitting: Interventional Cardiology

## 2022-03-22 DIAGNOSIS — Z955 Presence of coronary angioplasty implant and graft: Secondary | ICD-10-CM | POA: Diagnosis not present

## 2022-03-24 ENCOUNTER — Encounter (HOSPITAL_COMMUNITY)
Admission: RE | Admit: 2022-03-24 | Discharge: 2022-03-24 | Disposition: A | Discharge status: Home / Self Care | Payer: Medicare Other | Source: Ambulatory Visit | Attending: Interventional Cardiology | Admitting: Interventional Cardiology

## 2022-03-24 DIAGNOSIS — Z955 Presence of coronary angioplasty implant and graft: Secondary | ICD-10-CM

## 2022-03-24 NOTE — Progress Notes (Signed)
Cardiac Individual Treatment Plan  Patient Details  Name: Barbara Fletcher MRN: [id number] Date of Birth: [birth year] Referring Provider:   Flowsheet Row INTENSIVE CARDIAC REHAB ORIENT from 02/11/2022 in [place]  Referring Provider Larae Grooms, MD       Initial Encounter Date:  [facility] from 02/11/2022 in [place]  Date 02/11/22       Visit Diagnosis: 11/19/21 S/P DES LAD, PTCA 1st diagonal   Patient's Home Medications on Admission:  Current Outpatient Medications:    acetaminophen (TYLENOL) 500 MG tablet, Take 1,000 mg by mouth every 6 (six) hours as needed (Arthritis)., Disp: , Rfl:    alendronate (FOSAMAX) 70 MG tablet, Take 1 tablet (70 mg total) by mouth once a week. Take with a full glass of water on an empty stomach. (Patient taking differently: Take 70 mg by mouth once a week. Take with a full glass of water on an empty stomach. Sunday), Disp: 12 tablet, Rfl: 4   amLODipine (NORVASC) 5 MG tablet, TAKE 1 TABLET BY MOUTH DAILY, Disp: 90 tablet, Rfl: 3   apixaban (ELIQUIS) 5 MG TABS tablet, Take 1 tablet (5 mg total) by mouth 2 (two) times daily., Disp: 180 tablet, Rfl: 3   ascorbic acid (VITAMIN C) 500 MG tablet, Take 500 mg by mouth daily. Chewable, Disp: , Rfl:    carboxymethylcellulose (REFRESH PLUS) 0.5 % SOLN, Place 1 drop into both eyes daily as needed (dry eyes)., Disp: , Rfl:    clopidogrel (PLAVIX) 75 MG tablet, Take 1 tablet (75 mg total) by mouth daily., Disp: 90 tablet, Rfl: 1   diclofenac Sodium (VOLTAREN) 1 % GEL, Apply 2 g topically daily as needed (pain)., Disp: , Rfl:    famotidine (PEPCID) 10 MG tablet, Take 10 mg by mouth daily., Disp: , Rfl:    fexofenadine-pseudoephedrine (ALLEGRA-D 24) 180-240 MG per 24 hr tablet, Take 1 tablet by mouth daily as needed (allergies). Seasonal allergies, Disp: , Rfl:    FIBER ADULT GUMMIES PO, Take 2 tablets by mouth  daily., Disp: , Rfl:    hydrochlorothiazide (HYDRODIURIL) 25 MG tablet, One half tablet (12.5 mg ) or one tablet (25 mg) as needed for BP/swelling., Disp: 30 tablet, Rfl: 9   Lidocaine 4 % PTCH, Apply 1 patch topically daily as needed (pain)., Disp: , Rfl:    metoprolol tartrate (LOPRESSOR) 50 MG tablet, Take 1 tablet (50 mg) twice daily., Disp: 270 tablet, Rfl: 3   nitroGLYCERIN (NITROSTAT) 0.4 MG SL tablet, Place 1 tablet (0.4 mg total) under the tongue every 5 (five) minutes as needed for chest pain., Disp: 25 tablet, Rfl: 3   Omega-3 Fatty Acids (FISH OIL) 1200 MG CAPS, Take 1,200 mg by mouth in the morning., Disp: , Rfl:    pregabalin (LYRICA) 75 MG capsule, TAKE 1 CAPSULE BY MOUTH TWO TIMES DAILY (Patient taking differently: Take 75 mg by mouth 2 (two) times daily.), Disp: 60 capsule, Rfl: 5   PRESCRIPTION MEDICATION, Apply 1 application. topically daily as needed (rash). Hydrocortisone 2% equal part with Ketoconazole cream 2 % (mix together equal parts), Disp: , Rfl:    rosuvastatin (CRESTOR) 40 MG tablet, Take 1 tablet (40 mg total) by mouth daily., Disp: 90 tablet, Rfl: 0   Vitamin D, Ergocalciferol, 2000 units CAPS, Take 2,000 Units by mouth daily., Disp: , Rfl:   Past Medical History: Past Medical History:  Diagnosis Date   Arthritis    Cervical  radiculopathy    Chronic bilateral low back pain without sciatica    Class 2 severe obesity due to excess calories with serious comorbidity and body mass index (BMI) of 35.0 to 35.9 in adult [facility])    Coronary artery disease    Diverticulitis    Frequent headaches    Headaches, cluster    Hyperlipidemia    Hypertension    Osteoporosis without current pathological fracture    Palpitations    Squamous cell carcinoma of skin 11/24/2017   SCCIS, 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    Tobacco Use: Social History   Tobacco Use  Smoking Status Never  Smokeless Tobacco Never    Labs: Review Flowsheet  More data exists      Latest  Ref Rng & Units 10/31/2017 06/21/2018 10/09/2019 06/16/2021 01/13/2022  Labs for ITP Cardiac and Pulmonary Rehab  Cholestrol 100 - 199 mg/dL - 234  213  211  99   LDL (calc) 0 - 99 mg/dL - 140  127  131  25   HDL-C >39 mg/dL - 57  55  53  56   Trlycerides 0 - 149 mg/dL - 186  175  151  96   Hemoglobin A1c 4.8 - 5.6 % 6.0  - - 6.2  -    Capillary Blood Glucose: No results found for: "GLUCAP"   Exercise Target Goals: Exercise Program Goal: Individual exercise prescription set using results from initial 6 min walk test and THRR while considering  patient's activity barriers and safety.   Exercise Prescription Goal: Starting with aerobic activity 30 plus minutes a day, 3 days per week for initial exercise prescription. Provide home exercise prescription and guidelines that participant acknowledges understanding prior to discharge.  Activity Barriers & Risk Stratification:  Activity Barriers & Cardiac Risk Stratification - 02/11/22 1411       Activity Barriers & Cardiac Risk Stratification   Activity Barriers Arthritis;Back Problems;Neck/Spine Problems;Joint Problems;Deconditioning;Muscular Weakness;Shortness of Breath;Balance Concerns    Cardiac Risk Stratification High             6 Minute Walk:  6 Minute Walk     Row Name 02/11/22 1300         6 Minute Walk   Phase Initial     Distance 1168 feet     Walk Time 6 minutes     # of Rest Breaks 0     MPH 2.21     METS 2.08     RPE 11     Perceived Dyspnea  0     VO2 Peak 7.27     Symptoms No     Resting HR 60 bpm     Resting BP 124/70     Resting Oxygen Saturation  98 %     Exercise Oxygen Saturation  during 6 min walk 98 %     Max Ex. HR 92 bpm     Max Ex. BP 130/68     2 Minute Post BP 118/70              Oxygen Initial Assessment:   Oxygen Re-Evaluation:   Oxygen Discharge (Final Oxygen Re-Evaluation):   Initial Exercise Prescription:  Initial Exercise Prescription - 02/11/22 1400       Date of  Initial Exercise RX and Referring Provider   Date 02/11/22    Referring Provider Larae Grooms, MD    Expected Discharge Date 04/09/22      NuStep   Level 1  SPM 75    Minutes 30    METs 2      Prescription Details   Frequency (times per week) 3    Duration Progress to 30 minutes of continuous aerobic without signs/symptoms of physical distress      Intensity   THRR 40-80% of Max Heartrate 60-120    Ratings of Perceived Exertion 11-13    Perceived Dyspnea 0-4      Progression   Progression Continue progressive overload as per policy without signs/symptoms or physical distress.      Resistance Training   Training Prescription Yes    Weight 2 lbs    Reps 10-15             Perform Capillary Blood Glucose checks as needed.  Exercise Prescription Changes:   Exercise Prescription Changes     Row Name 02/15/22 1600 03/03/22 1400 03/08/22 1500 03/24/22 1412       Response to Exercise   Blood Pressure (Admit) 114/60 110/60 118/62 114/78    Blood Pressure (Exercise) 118/62 140/78 120/68 132/52    Blood Pressure (Exit) 106/78 100/58 104/60 94/52  BP recheck: 104/68    Heart Rate (Admit) 52 bpm 60 bpm 56 bpm 47 bpm    Heart Rate (Exercise) 77 bpm 87 bpm 86 bpm 83 bpm    Heart Rate (Exit) 51 bpm 53 bpm 54 bpm 55 bpm    Rating of Perceived Exertion (Exercise) 11.'5 13 13 13    '$ Symptoms None None None None    Comments Pt's first day in the CRP2 program Reviewed METs Reviewed Goals and Home exercise Rx reviewed METs    Duration Continue with 30 min of aerobic exercise without signs/symptoms of physical distress. Continue with 30 min of aerobic exercise without signs/symptoms of physical distress. Progress to 30 minutes of  aerobic without signs/symptoms of physical distress Progress to 30 minutes of  aerobic without signs/symptoms of physical distress    Intensity THRR unchanged THRR unchanged THRR unchanged THRR unchanged      Progression   Progression Continue to  progress workloads to maintain intensity without signs/symptoms of physical distress. Continue to progress workloads to maintain intensity without signs/symptoms of physical distress. Continue to progress workloads to maintain intensity without signs/symptoms of physical distress. Continue to progress workloads to maintain intensity without signs/symptoms of physical distress.    Average METs 2.3 2.8 2.9 3.2      Resistance Training   Training Prescription Yes No Yes Yes    Weight 2 lbs No weights on Wednesdays 2 lbs 2 lbs    Reps 10-15 -- 10-15 10-15    Time 10 Minutes -- 10 Minutes 10 Minutes      Interval Training   Interval Training No No No No      NuStep   Level '1 2 2 2    '$ SPM 76 -- -- --    Minutes 35 '30 30 30    '$ METs 2.3 2.8 2.9 3.2      Home Exercise Plan   Plans to continue exercise at -- -- Home (comment) Home (comment)    Frequency -- -- Add 4 additional days to program exercise sessions. Add 4 additional days to program exercise sessions.    Initial Home Exercises Provided -- -- 03/08/22 03/08/22             Exercise Comments:   Exercise Comments     Row Name 02/15/22 1625 03/03/22 1432 03/08/22 1545 03/24/22 1415  Exercise Comments Pt's first day in the CRP2 program. Pt had no complaints with today's session. Pt is off to a good start. Reviewed METs. Making good progress. Increased workload on Nustep today with no complaints. Reviewed Goals and home exericse Rx. Pt verbalized understanding of the home exercise Rx and was provided a copy. reviewed METs with pt today. pt continues to make good progress, and will continue to increase workloads as tolerated without s/s.             Exercise Goals and Review:   Exercise Goals     Row Name 02/11/22 1414             Exercise Goals   Increase Physical Activity Yes       Intervention Provide advice, education, support and counseling about physical activity/exercise needs.;Develop an individualized  exercise prescription for aerobic and resistive training based on initial evaluation findings, risk stratification, comorbidities and participant's personal goals.       Expected Outcomes Short Term: Attend rehab on a regular basis to increase amount of physical activity.;Long Term: Add in home exercise to make exercise part of routine and to increase amount of physical activity.;Long Term: Exercising regularly at least 3-5 days a week.       Increase Strength and Stamina Yes       Intervention Provide advice, education, support and counseling about physical activity/exercise needs.;Develop an individualized exercise prescription for aerobic and resistive training based on initial evaluation findings, risk stratification, comorbidities and participant's personal goals.       Expected Outcomes Short Term: Increase workloads from initial exercise prescription for resistance, speed, and METs.;Short Term: Perform resistance training exercises routinely during rehab and add in resistance training at home;Long Term: Improve cardiorespiratory fitness, muscular endurance and strength as measured by increased METs and functional capacity (6MWT)       Able to understand and use rate of perceived exertion (RPE) scale Yes       Intervention Provide education and explanation on how to use RPE scale       Expected Outcomes Short Term: Able to use RPE daily in rehab to express subjective intensity level;Long Term:  Able to use RPE to guide intensity level when exercising independently       Knowledge and understanding of Target Heart Rate Range (THRR) Yes       Intervention Provide education and explanation of THRR including how the numbers were predicted and where they are located for reference       Expected Outcomes Short Term: Able to state/look up THRR;Short Term: Able to use daily as guideline for intensity in rehab;Long Term: Able to use THRR to govern intensity when exercising independently       Understanding  of Exercise Prescription Yes       Intervention Provide education, explanation, and written materials on patient's individual exercise prescription       Expected Outcomes Short Term: Able to explain program exercise prescription;Long Term: Able to explain home exercise prescription to exercise independently                Exercise Goals Re-Evaluation :  Exercise Goals Re-Evaluation     Row Name 02/15/22 1624 03/08/22 1542           Exercise Goal Re-Evaluation   Exercise Goals Review Increase Physical Activity;Increase Strength and Stamina;Able to understand and use rate of perceived exertion (RPE) scale;Knowledge and understanding of Target Heart Rate Range (THRR);Understanding of Exercise Prescription Increase Physical Activity;Increase  Strength and Stamina;Able to understand and use rate of perceived exertion (RPE) scale;Knowledge and understanding of Target Heart Rate Range (THRR);Understanding of Exercise Prescription      Comments Pt's first day in the CRP2 program. Pt understands the exercise Rx, THRR and RPE scale. Reviewed goals and home exercise Rx. Pt has goal ti increase stength and stamina. Pt voices that she has seen an increase in her strength and stamina but it is not where she would like to be. Her SOB has improved but she voices she still gets "winded" on hill when walking. Pt voices that she walks with her husdand at home 3-4x/week for 30-45 minutes.on her off days from the [facility] program.      Expected Outcomes Will continue to monitor and progress exercise workloads as tolerated. Pt will continute to walk at home in addtion to the [place] program.                Discharge Exercise Prescription (Final Exercise Prescription Changes):  Exercise Prescription Changes - 03/24/22 1412       Response to Exercise   Blood Pressure (Admit) 114/78    Blood Pressure (Exercise) 132/52    Blood Pressure (Exit) 94/52   BP recheck: 104/68   Heart Rate (Admit) 47 bpm    Heart  Rate (Exercise) 83 bpm    Heart Rate (Exit) 55 bpm    Rating of Perceived Exertion (Exercise) 13    Symptoms None    Comments reviewed METs    Duration Progress to 30 minutes of  aerobic without signs/symptoms of physical distress    Intensity THRR unchanged      Progression   Progression Continue to progress workloads to maintain intensity without signs/symptoms of physical distress.    Average METs 3.2      Resistance Training   Training Prescription Yes    Weight 2 lbs    Reps 10-15    Time 10 Minutes      Interval Training   Interval Training No      NuStep   Level 2    Minutes 30    METs 3.2      Home Exercise Plan   Plans to continue exercise at Home (comment)    Frequency Add 4 additional days to program exercise sessions.    Initial Home Exercises Provided 03/08/22             Nutrition:  Target Goals: Understanding of nutrition guidelines, daily intake of sodium '1500mg'$ , cholesterol '200mg'$ , calories 30% from fat and 7% or less from saturated fats, daily to have 5 or more servings of fruits and vegetables.  Biometrics:  Pre Biometrics - 02/11/22 1125       Pre Biometrics   Waist Circumference 42.75 inches    Hip Circumference 51 inches    Waist to Hip Ratio 0.84 %    Triceps Skinfold 30 mm    % Body Fat 47.3 %    Grip Strength 20 kg    Flexibility --   No done due to significant back issues   Single Leg Stand 3.56 seconds              Nutrition Therapy Plan and Nutrition Goals:  Nutrition Therapy & Goals - 03/12/22 1323       Nutrition Therapy   Diet Heart Healthy Diet    Drug/Food Interactions Statins/Certain Fruits      Personal Nutrition Goals   Nutrition Goal Patient to choose a variety of fruits,  vegetables, whole grains, lean protein/plant protein, and nonfat dairy daily as part of heart meal plan    Personal Goal #2 Patient to reduce food frequency of and amount of refined carbohydrates and simple sugars in diet daily.    Comments  Goals in progress. Patient continues to work toward a heart healthy diet per diet recall. Her husband attends the [employer] classes alongside her and has previously completed cardiac rehab himself.      Intervention Plan   Intervention Prescribe, educate and counsel regarding individualized specific dietary modifications aiming towards targeted core components such as weight, hypertension, lipid management, diabetes, heart failure and other comorbidities.    Expected Outcomes Short Term Goal: Understand basic principles of dietary content, such as calories, fat, sodium, cholesterol and nutrients.;Long Term Goal: Adherence to prescribed nutrition plan.             Nutrition Assessments:  Nutrition Assessments - 02/15/22 1331       Rate Your Plate Scores   Pre Score 71            MEDIFICTS Score Key: ?70 Need to make dietary changes  40-70 Heart Healthy Diet ? 40 Therapeutic Level Cholesterol Diet  Flowsheet Row INTENSIVE CARDIAC REHAB from 02/15/2022 in [place]  Picture Your Plate Total Score on Admission 71      Picture Your Plate Scores: <61 Unhealthy dietary pattern with much room for improvement. 41-50 Dietary pattern unlikely to meet recommendations for good health and room for improvement. 51-60 More healthful dietary pattern, with some room for improvement.  >60 Healthy dietary pattern, although there may be some specific behaviors that could be improved.    Nutrition Goals Re-Evaluation:  Nutrition Goals Re-Evaluation     [place] Name 03/12/22 1323             Goals   Current Weight 175 lb 14.8 oz (79.8 kg)       Comment Her husband attends the [employer] classes alongside her and has previously completed cardiac rehab himself.       Expected Outcome Goals in progress. Patient continues to work toward a heart healthy diet per diet recall. Most recent A1c was 6.2, expect improved with continued dietary changes. She is down 5# since  starting with our program.                Nutrition Goals Discharge (Final Nutrition Goals Re-Evaluation):  Nutrition Goals Re-Evaluation - 03/12/22 1323       Goals   Current Weight 175 lb 14.8 oz (79.8 kg)    Comment Her husband attends the [employer] classes alongside her and has previously completed cardiac rehab himself.    Expected Outcome Goals in progress. Patient continues to work toward a heart healthy diet per diet recall. Most recent A1c was 6.2, expect improved with continued dietary changes. She is down 5# since starting with our program.             Psychosocial: Target Goals: Acknowledge presence or absence of significant depression and/or stress, maximize coping skills, provide positive support system. Participant is able to verbalize types and ability to use techniques and skills needed for reducing stress and depression.  Initial Review & Psychosocial Screening:  Initial Psych Review & Screening - 02/11/22 1542       Initial Review   Current issues with Current Stress Concerns    Source of Stress Concerns Family    Comments Barbara Fletcher and her husband Barbara Fletcher live with her  elderly mother in law which can be stressful at times      [place]? Yes   Barbara Fletcher has her husband and her two children for support     Barriers   Psychosocial barriers to participate in program The patient should benefit from training in stress management and relaxation.      Screening Interventions   Interventions Encouraged to exercise    Expected Outcomes Long Term Goal: Stressors or current issues are controlled or eliminated.             Quality of Life Scores:  Quality of Life - 02/11/22 1419       Quality of Life   Select Quality of Life      Quality of Life Scores   Health/Function Pre 24.1 %    Socioeconomic Pre 23.5 %    Psych/Spiritual Pre 29.14 %    Family Pre 26.4 %    GLOBAL Pre 25.3 %            Scores of 19 and below  usually indicate a poorer quality of life in these areas.  A difference of  2-3 points is a clinically meaningful difference.  A difference of 2-3 points in the total score of the Quality of Life Index has been associated with significant improvement in overall quality of life, self-image, physical symptoms, and general health in studies assessing change in quality of life.  PHQ-9: Review Flowsheet       02/11/2022 10/12/2021 06/21/2018 03/10/2017  Depression screen PHQ 2/9  Decreased Interest 0 0 0 0 0  Down, Depressed, Hopeless 0 0 0 0 0  PHQ - 2 Score 0 0 0 0 0  Altered sleeping - 0 - 0  Tired, decreased energy - 0 - 0  Change in appetite - 0 - 0  Feeling bad or failure about yourself  - 0 - 0  Trouble concentrating - 0 - 0  Moving slowly or fidgety/restless - 0 - 0  Suicidal thoughts - 0 - 0  PHQ-9 Score - 0 - 0  Difficult doing work/chores - Not difficult at all - -   Interpretation of Total Score  Total Score Depression Severity:  1-4 = Minimal depression, 5-9 = Mild depression, 10-14 = Moderate depression, 15-19 = Moderately severe depression, 20-27 = Severe depression   Psychosocial Evaluation and Intervention:   Psychosocial Re-Evaluation:  Psychosocial Re-Evaluation     Row Name 02/16/22 0743 02/25/22 6195 03/24/22 1630         Psychosocial Re-Evaluation   Current issues with Current Stress Concerns Current Stress Concerns Current Stress Concerns     Comments Barbara Fletcher did not voice any increased concerns or stressors on her first day of exercise Barbara Fletcher has not  voiced any increased concerns or stressors during exercise at intensive cardiac rehab Barbara Fletcher continues not to  voice any increased concerns or stressors during exercise at intensive cardiac rehab     Expected Outcomes Barbara Fletcher will have decreased or controlled stress upon completion of phase 2 cardiac rehab Barbara Fletcher will have decreased or controlled stress upon completion of phase 2 cardiac rehab Barbara Fletcher will have  decreased or controlled stress upon completion of phase 2 cardiac rehab     Interventions Encouraged to attend Cardiac Rehabilitation for the exercise;Stress management education;Relaxation education Encouraged to attend Cardiac Rehabilitation for the exercise;Stress management education;Relaxation education Encouraged to attend Cardiac Rehabilitation for the exercise;Stress management education;Relaxation education     Continue Psychosocial Services  Follow up required by staff Follow up required by staff Follow up required by staff       Initial Review   Source of Stress Concerns Family Family Family     Comments Will continue to monitor and offer support as needed Will continue to monitor and offer support as needed Will continue to monitor and offer support as needed              Psychosocial Discharge (Final Psychosocial Re-Evaluation):  Psychosocial Re-Evaluation - 03/24/22 1630       Psychosocial Re-Evaluation   Current issues with Current Stress Concerns    Comments Barbara Fletcher continues not to  voice any increased concerns or stressors during exercise at intensive cardiac rehab    Expected Outcomes Barbara Fletcher will have decreased or controlled stress upon completion of phase 2 cardiac rehab    Interventions Encouraged to attend Cardiac Rehabilitation for the exercise;Stress management education;Relaxation education    Continue Psychosocial Services  Follow up required by staff      Initial Review   Source of Stress Concerns Family    Comments Will continue to monitor and offer support as needed             Vocational Rehabilitation: Provide vocational rehab assistance to qualifying candidates.   Vocational Rehab Evaluation & Intervention:  Vocational Rehab - 02/11/22 1544       Initial Vocational Rehab Evaluation & Intervention   Assessment shows need for Vocational Rehabilitation No   Barbara Fletcher is retired and does not need vocational rehab at this time             Education: Education Goals: Education classes will be provided on a weekly basis, covering required topics. Participant will state understanding/return demonstration of topics presented.  Learning Barriers/Preferences:  Learning Barriers/Preferences - 02/11/22 1420       Learning Barriers/Preferences   Learning Barriers Sight   wears glasses   Learning Preferences Audio;Computer/Internet;Group Instruction;Individual Instruction;Pictoral;Skilled Demonstration;Verbal Instruction;Video             Education Topics: Hypertension, Hypertension Reduction -Define heart disease and high blood pressure. Discus how high blood pressure affects the body and ways to reduce high blood pressure.   Exercise and Your Heart -Discuss why it is important to exercise, the FITT principles of exercise, normal and abnormal responses to exercise, and how to exercise safely.   Angina -Discuss definition of angina, causes of angina, treatment of angina, and how to decrease risk of having angina.   Cardiac Medications -Review what the following cardiac medications are used for, how they affect the body, and side effects that may occur when taking the medications.  Medications include Aspirin, Beta blockers, calcium channel blockers, ACE Inhibitors, angiotensin receptor blockers, diuretics, digoxin, and antihyperlipidemics.   Congestive Heart Failure -Discuss the definition of CHF, how to live with CHF, the signs and symptoms of CHF, and how keep track of weight and sodium intake.   Heart Disease and Intimacy -Discus the effect sexual activity has on the heart, how changes occur during intimacy as we age, and safety during sexual activity.   Smoking Cessation / COPD -Discuss different methods to quit smoking, the health benefits of quitting smoking, and the definition of COPD.   Nutrition I: Fats -Discuss the types of cholesterol, what cholesterol does to the heart, and how cholesterol levels  can be controlled.   Nutrition II: Labels -Discuss the different components of food labels and how to read food label   Heart Parts/Heart Disease  and PAD -Discuss the anatomy of the heart, the pathway of blood circulation through the heart, and these are affected by heart disease.   Stress I: Signs and Symptoms -Discuss the causes of stress, how stress may lead to anxiety and depression, and ways to limit stress.   Stress II: Relaxation -Discuss different types of relaxation techniques to limit stress.   Warning Signs of Stroke / TIA -Discuss definition of a stroke, what the signs and symptoms are of a stroke, and how to identify when someone is having stroke.   Knowledge Questionnaire Score:  Knowledge Questionnaire Score - 02/11/22 1419       Knowledge Questionnaire Score   Pre Score 21/24             Core Components/Risk Factors/Patient Goals at Admission:  Personal Goals and Risk Factors at Admission - 02/11/22 1422       Core Components/Risk Factors/Patient Goals on Admission    Weight Management Yes;Weight Loss    Intervention Weight Management: Develop a combined nutrition and exercise program designed to reach desired caloric intake, while maintaining appropriate intake of nutrient and fiber, sodium and fats, and appropriate energy expenditure required for the weight goal.;Weight Management: Provide education and appropriate resources to help participant work on and attain dietary goals.;Weight Management/Obesity: Establish reasonable short term and long term weight goals.;Obesity: Provide education and appropriate resources to help participant work on and attain dietary goals.    Admit Weight 181 lb 3.5 oz (82.2 kg)    Expected Outcomes Short Term: Continue to assess and modify interventions until short term weight is achieved;Long Term: Adherence to nutrition and physical activity/exercise program aimed toward attainment of established weight goal;Weight  Maintenance: Understanding of the daily nutrition guidelines, which includes 25-35% calories from fat, 7% or less cal from saturated fats, less than '200mg'$  cholesterol, less than 1.5gm of sodium, & 5 or more servings of fruits and vegetables daily;Weight Loss: Understanding of general recommendations for a balanced deficit meal plan, which promotes 1-2 lb weight loss per week and includes a negative energy balance of [id number] kcal/d;Understanding recommendations for meals to include 15-35% energy as protein, 25-35% energy from fat, 35-60% energy from carbohydrates, less than '200mg'$  of dietary cholesterol, 20-35 gm of total fiber daily;Understanding of distribution of calorie intake throughout the day with the consumption of 4-5 meals/snacks    Hypertension Yes    Intervention Provide education on lifestyle modifcations including regular physical activity/exercise, weight management, moderate sodium restriction and increased consumption of fresh fruit, vegetables, and low fat dairy, alcohol moderation, and smoking cessation.;Monitor prescription use compliance.    Expected Outcomes Short Term: Continued assessment and intervention until BP is < 140/93m HG in hypertensive participants. < 130/831mHG in hypertensive participants with diabetes, heart failure or chronic kidney disease.;Long Term: Maintenance of blood pressure at goal levels.    Lipids Yes    Intervention Provide education and support for participant on nutrition & aerobic/resistive exercise along with prescribed medications to achieve LDL '70mg'$ , HDL >'40mg'$ .    Expected Outcomes Short Term: Participant states understanding of desired cholesterol values and is compliant with medications prescribed. Participant is following exercise prescription and nutrition guidelines.;Long Term: Cholesterol controlled with medications as prescribed, with individualized exercise RX and with personalized nutrition plan. Value goals: LDL < '70mg'$ , HDL > 40 mg.    Stress  Yes    Intervention Offer individual and/or small group education and counseling on adjustment to heart disease, stress management and health-related lifestyle change. Teach and support  self-help strategies.;Refer participants experiencing significant psychosocial distress to appropriate mental health specialists for further evaluation and treatment. When possible, include family members and significant others in education/counseling sessions.    Expected Outcomes Short Term: Participant demonstrates changes in health-related behavior, relaxation and other stress management skills, ability to obtain effective social support, and compliance with psychotropic medications if prescribed.;Long Term: Emotional wellbeing is indicated by absence of clinically significant psychosocial distress or social isolation.             Core Components/Risk Factors/Patient Goals Review:   Goals and Risk Factor Review     Row Name 02/17/22 0835 02/25/22 0809 03/24/22 1631         Core Components/Risk Factors/Patient Goals Review   Personal Goals Review Weight Management/Obesity;Hypertension;Lipids;Stress Weight Management/Obesity;Hypertension;Lipids;Stress Weight Management/Obesity;Hypertension;Lipids;Stress     Review Barbara Fletcher started cardiac rehab on 02/15/22. Barbara Fletcher did well with exercise. Vital signs stable Barbara Fletcher is off to a good start to exercise at intensive cardiac rehab. Vital signs have been stable Barbara Fletcher is doing well with exercise at intensive caridac rehab. Barbara Fletcher     Expected Outcomes Barbara Fletcher will continue to participate in phase 2 cardiac rehab for exercise, nutrition and lifestyle modifications Claudina will continue to participate in phase 2 cardiac rehab for exercise, nutrition and lifestyle modifications Kassady will continue to participate in phase 2 cardiac rehab for exercise, nutrition and lifestyle modifications               Core Components/Risk Factors/Patient Goals at Discharge (Final Review):   Goals and Risk Factor Review - 03/24/22 1631       Core Components/Risk Factors/Patient Goals Review   Personal Goals Review Weight Management/Obesity;Hypertension;Lipids;Stress    Review Barbara Fletcher is doing well with exercise at intensive caridac rehab. Barbara Fletcher    Expected Outcomes Barbara Fletcher will continue to participate in phase 2 cardiac rehab for exercise, nutrition and lifestyle modifications             ITP Comments:  ITP Comments     Row Name 02/11/22 1537 02/16/22 0741 02/25/22 0806 03/24/22 1628     ITP Comments Dr Fransico Him MD, Medical Director. Introduction to [facility] Education program. Initial Resource packet reviewed with the patient and her husband. 30 Day ITP Review. Ka started cardiac rehab on 02/15/22 and did well with exercise. 30 Day ITP Review. Shatonya is off to a good start to exercise at intensive cardiac rehab 30 Day ITP Review. Carmell has good attendance and participation in intensive cardiac rehab             Comments: See ITP comments.Harrell Gave RN BSN

## 2022-03-24 NOTE — Progress Notes (Signed)
Sinus brady 47 noted resting, non sustained prior to exercise this afternoon at intensive cardiac rehab. Patient asymptomatic.  Today's vital signs are as follows. Entry heart rate 47 blood pressure 114/78. Max heart rate noted at 83 on the nustep blood pressure 132/52. Exit heart rate 55 blood pressure 94/52. Patient asymptomatic. Given water. Recheck blood pressure 104/68. Will notify Dr Beau Fanny of today's hear rate and vital signs. Barbara Fletcher is taking 50 mg of metoprolol twice a day as her lunch time does was discontinued. Will continue to monitor the patient throughout  the [place] RN BSN

## 2022-03-26 ENCOUNTER — Encounter (HOSPITAL_COMMUNITY)
Admission: RE | Admit: 2022-03-26 | Discharge: 2022-03-26 | Disposition: A | Discharge status: Home / Self Care | Payer: Medicare Other | Source: Ambulatory Visit | Attending: Interventional Cardiology | Admitting: Interventional Cardiology

## 2022-03-26 DIAGNOSIS — Z955 Presence of coronary angioplasty implant and graft: Secondary | ICD-10-CM | POA: Diagnosis not present

## 2022-03-27 ENCOUNTER — Other Ambulatory Visit: Payer: Self-pay | Admitting: Cardiology

## 2022-03-29 ENCOUNTER — Other Ambulatory Visit: Payer: Self-pay

## 2022-03-29 ENCOUNTER — Encounter (HOSPITAL_COMMUNITY)
Admission: RE | Admit: 2022-03-29 | Discharge: 2022-03-29 | Disposition: A | Discharge status: Home / Self Care | Payer: Medicare Other | Source: Ambulatory Visit | Attending: Interventional Cardiology | Admitting: Interventional Cardiology

## 2022-03-29 DIAGNOSIS — Z955 Presence of coronary angioplasty implant and graft: Secondary | ICD-10-CM | POA: Diagnosis not present

## 2022-03-29 MED ORDER — ROSUVASTATIN CALCIUM 40 MG PO TABS
40.0000 mg | ORAL_TABLET | Freq: Every day | ORAL | 0 refills | Status: DC
  Filled 2022-03-29: qty 90, 90d supply, fill #0

## 2022-03-29 NOTE — Progress Notes (Unsigned)
Cardiology Office Note   Date:  03/30/2022   ID:  Barbara Fletcher, DOB 1951-01-14, MRN [id number]  PCP:  Barbara Fletcher., MD    No chief complaint on file.  CAD  Wt Readings from Last 3 Encounters:  03/30/22 172 lb (78 kg)  02/11/22 181 lb 3.5 oz (82.2 kg)  12/04/21 186 lb 12.8 oz (84.7 kg)       History of Present Illness: Barbara Fletcher is a 71 y.o. female  who I saw in 2021 for palpitations.  She noted heart rates to 180s at that time.   Her husband is my patient.  He is a retired Public librarian.  I saw him in early February 2023.  He brought in [place] strips from her showing atrial fibrillation with rapid ventricular response.  Eliquis was started.  Due to chest comfort, she had a CTA in 2023 which was abnormal.  This prompted cardiac catheterization which showed: "Prox Cx lesion is 30% stenosed.   Prox LAD lesion is 25% stenosed.   Mid LAD lesion is 80% stenosed.  A drug-eluting stent was successfully placed using a STENT ONYX FRONTIER 3.0X30, postdilated to 3.25 mm and optimized with intravascular ultrasound.   Post intervention, there is a 0% residual stenosis.   2nd Diag lesion is 99% stenosed.  Plaque shift into this diagonal after stenting of the LAD.  Transient loss of flow but TIMI-3 flow returned after balloon angioplasty.  Balloon angioplasty was performed using a BALLN SAPPHIRE 2.0X12.   Dist RCA lesion is 25% stenosed.   Post intervention, there is a 25% residual stenosis.   The left ventricular systolic function is normal.   LV end diastolic pressure is mildly elevated.   The left ventricular ejection fraction is 55-65% by visual estimate.   There is no aortic valve stenosis."   She felt her dyspnea was improved post PCI.  Finishing rehab.  She is trying to find a gym to continue after rehab is done.   Denies : Chest pain. Dizziness. Leg edema. Nitroglycerin use. Orthopnea. Palpitations. Paroxysmal nocturnal dyspnea. Shortness of breath. Syncope.     Past Medical History:  Diagnosis Date   Arthritis    Cervical radiculopathy    Chronic bilateral low back pain without sciatica    Class 2 severe obesity due to excess calories with serious comorbidity and body mass index (BMI) of 35.0 to 35.9 in adult [facility])    Coronary artery disease    Diverticulitis    Frequent headaches    Headaches, cluster    Hyperlipidemia    Hypertension    Osteoporosis without current pathological fracture    Palpitations    Squamous cell carcinoma of skin 11/24/2017   SCCIS, right third fingernail - [facility] with [facility]    Past Surgical History:  Procedure Laterality Date   ABDOMINAL HYSTERECTOMY  08/30/1988   BACK SURGERY     lumbar Barbara Fletcher  08/30/1984   COLOSTOMY     COLOSTOMY CLOSURE     abd diverticulitis with colostomy, reversal 2007- 2008   CORONARY ANGIOPLASTY     CORONARY STENT INTERVENTION N/A 11/19/2021   Procedure: CORONARY STENT INTERVENTION;  Surgeon: Barbara Booze, MD;  Location: [place] [facility];  Service: Cardiovascular;  Laterality: N/A;   LAPAROTOMY  02/13/2012   Procedure: EXPLORATORY LAPAROTOMY;  Surgeon: Barbara Bookbinder, MD;  Location: [place];  Service: General;  Laterality: N/A;  Exploratory Laparotomy with small bowel resection, lysis of adhension and primary ventral hernia repair   LEFT HEART CATH AND CORONARY ANGIOGRAPHY N/A 11/19/2021   Procedure: LEFT HEART CATH AND CORONARY ANGIOGRAPHY;  Surgeon: Barbara Booze, MD;  Location: [place] [facility];  Service: Cardiovascular;  Laterality: N/A;   [place]     Current Outpatient Medications  Medication Sig Dispense Refill   acetaminophen (TYLENOL) 500 MG tablet Take 1,000 mg by mouth every 6 (six) hours as needed (Arthritis).     alendronate (FOSAMAX) 70 MG tablet Take 1 tablet (70 mg total) by mouth once a week. Take with a full glass of water on  an empty stomach. (Patient taking differently: Take 70 mg by mouth once a week. Take with a full glass of water on an empty stomach. Sunday) 12 tablet 4   amLODipine (NORVASC) 5 MG tablet TAKE 1 TABLET BY MOUTH DAILY 90 tablet 3   apixaban (ELIQUIS) 5 MG TABS tablet Take 1 tablet (5 mg total) by mouth 2 (two) times daily. 180 tablet 3   ascorbic acid (VITAMIN C) 500 MG tablet Take 500 mg by mouth daily. Chewable     carboxymethylcellulose (REFRESH PLUS) 0.5 % SOLN Place 1 drop into both eyes daily as needed (dry eyes).     clopidogrel (PLAVIX) 75 MG tablet Take 1 tablet (75 mg total) by mouth daily. 90 tablet 1   diclofenac Sodium (VOLTAREN) 1 % GEL Apply 2 g topically daily as needed (pain).     famotidine (PEPCID) 10 MG tablet Take 10 mg by mouth daily.     fexofenadine-pseudoephedrine (ALLEGRA-D 24) 180-240 MG per 24 hr tablet Take 1 tablet by mouth daily as needed (allergies). Seasonal allergies     FIBER ADULT GUMMIES PO Take 2 tablets by mouth daily.     hydrochlorothiazide (HYDRODIURIL) 25 MG tablet One half tablet (12.5 mg ) or one tablet (25 mg) as needed for BP/swelling. 30 tablet 9   ketoconazole (NIZORAL) 2 % cream as needed. Heat rash     Lidocaine 4 % PTCH Apply 1 patch topically daily as needed (pain).     metoprolol tartrate (LOPRESSOR) 50 MG tablet Take 1 tablet (50 mg) twice daily. 270 tablet 3   nitroGLYCERIN (NITROSTAT) 0.4 MG SL tablet Place 1 tablet (0.4 mg total) under the tongue every 5 (five) minutes as needed for chest pain. 25 tablet 3   Omega-3 Fatty Acids (FISH OIL) 1200 MG CAPS Take 1,200 mg by mouth in the morning.     PRESCRIPTION MEDICATION Apply 1 application. topically daily as needed (rash). Hydrocortisone 2% equal part with Ketoconazole cream 2 % (mix together equal parts)     rosuvastatin (CRESTOR) 40 MG tablet Take 1 tablet (40 mg total) by mouth daily. 90 tablet 0   Vitamin D, Ergocalciferol, 2000 units CAPS Take 2,000 Units by mouth daily.     pregabalin  (LYRICA) 75 MG capsule TAKE 1 CAPSULE BY MOUTH TWO TIMES DAILY 60 capsule 5   No current facility-administered medications for this visit.    Allergies:   Levsin [hyoscyamine sulfate], Levsin [hyoscyamine], and Lisinopril    Social History:  The patient  reports that she has never smoked. She has never used smokeless tobacco. She reports current alcohol use of about 1.0 standard drink of alcohol per week. She reports that she does not use drugs.   Family History:  The  patient's family history includes Heart failure in her mother; Other in her mother.    ROS:  Please see the history of present illness.   Otherwise, review of systems are positive for hip pain; rare blood in urine- no dysuria   All other systems are reviewed and negative.    PHYSICAL EXAM: VS:  BP 114/60   Pulse (!) 46   Ht '5\' 1"'$  (1.549 m)   Wt 172 lb (78 kg)   SpO2 96%   BMI 32.50 kg/m  , BMI Body mass index is 32.5 kg/m. GEN: Well nourished, well developed, in no acute distress HEENT: normal Neck: no JVD, carotid bruits, or masses Cardiac: RRR; no murmurs, rubs, or gallops,no edema  Respiratory:  clear to auscultation bilaterally, normal work of breathing GI: soft, nontender, nondistended, + BS MS: no deformity or atrophy, 2+ right radial pulse Skin: warm and dry, no rash Neuro:  Strength and sensation are intact Psych: euthymic mood, full affect   EKG:   The ekg ordered today demonstrates sinus bradycardia, poor R wave progression, no significant change from prior ECG other than heart rate   Recent Labs: 10/21/2021: NT-Pro BNP CANCELED; TSH 2.700 11/12/2021: Hemoglobin 12.6; Platelets 279 12/04/2021: BUN 16; Creatinine, Ser 1.00; Potassium 4.4; Sodium 148 01/13/2022: ALT 18   Lipid Panel    Component Value Date/Time   CHOL 99 (L) 01/13/2022 1105   TRIG 96 01/13/2022 1105   HDL 56 01/13/2022 1105   CHOLHDL 1.8 01/13/2022 1105   LDLCALC 25 01/13/2022 1105     Other studies Reviewed: Additional  studies/ records that were reviewed today with results demonstrating: labs reviewed.   ASSESSMENT AND PLAN:  CAD: Status post LAD stent.  Continue aggressive secondary prevention.  Continue clopidogrel.  No significant bleeding issues.  No aspirin since she is already on Eliquis.  We will plan to continue clopidogrel for 1 year post stent. Hypertension: The current medical regimen is effective;  continue present plan and medications.  No lightheadedness. PAF: Eliquis for stroke prevention.  She has been noted to have slow heart rates at cardiac rehab.  ECG today also showed heart rate of 46.  She has no symptoms of lightheadedness.  Given that she was quite symptomatic with her A-fib, I am hesitant to decrease her beta-blocker.  If she has any lightheadedness or hypotension, could decrease metoprolol to 25 mg twice daily.  In the absence of symptoms, will continue current dose as she has not had any recurrent atrial fibrillation. Hyperlipidemia: Well-controlled.  LDL 25 in May 2023.  Continue rosuvastatin.    Current medicines are reviewed at length with the patient today.  The patient concerns regarding her medicines were addressed.  The following changes have been made:  No change  Labs/ tests ordered today include:  No orders of the defined types were placed in this encounter.   Recommend 150 minutes/week of aerobic exercise Low fat, low carb, high fiber diet recommended  Disposition:   FU in 8 months   Signed, Larae Grooms, MD  03/30/2022 10:37 AM    [facility] [place], [place], [place]  [address] Phone: [phone]; Fax: [phone]

## 2022-03-30 ENCOUNTER — Encounter: Payer: Self-pay | Admitting: Interventional Cardiology

## 2022-03-30 ENCOUNTER — Ambulatory Visit: Payer: Medicare Other | Admitting: Interventional Cardiology

## 2022-03-30 VITALS — BP 114/60 | HR 46 | Ht 61.0 in | Wt 172.0 lb

## 2022-03-30 DIAGNOSIS — I2510 Atherosclerotic heart disease of native coronary artery without angina pectoris: Secondary | ICD-10-CM

## 2022-03-30 DIAGNOSIS — I480 Paroxysmal atrial fibrillation: Secondary | ICD-10-CM

## 2022-03-30 DIAGNOSIS — Z955 Presence of coronary angioplasty implant and graft: Secondary | ICD-10-CM | POA: Diagnosis not present

## 2022-03-30 DIAGNOSIS — E785 Hyperlipidemia, unspecified: Secondary | ICD-10-CM

## 2022-03-30 DIAGNOSIS — I10 Essential (primary) hypertension: Secondary | ICD-10-CM

## 2022-03-30 NOTE — Patient Instructions (Signed)
Medication Instructions:  Your physician recommends that you continue on your current medications as directed. Please refer to the Current Medication list given to you today.  *If you need a refill on your cardiac medications before your next appointment, please call your pharmacy*   Lab Work: none If you have labs (blood work) drawn today and your tests are completely normal, you will receive your results only by: [place] (if you have MyChart) OR A paper copy in the mail If you have any lab test that is abnormal or we need to change your treatment, we will call you to review the results.   Testing/Procedures: none   Follow-Up: At [facility], you and your health needs are our priority.  As part of our continuing mission to provide you with exceptional heart care, we have created designated Provider Care Teams.  These Care Teams include your primary Cardiologist (physician) and Advanced Practice Providers (APPs -  Physician Assistants and Nurse Practitioners) who all work together to provide you with the care you need, when you need it.  We recommend signing up for the patient portal called "MyChart".  Sign up information is provided on this After Visit Summary.  MyChart is used to connect with patients for Virtual Visits (Telemedicine).  Patients are able to view lab/test results, encounter notes, upcoming appointments, etc.  Non-urgent messages can be sent to your provider as well.   To learn more about what you can do with MyChart, go to [email or web address].    Your next appointment:   8 month(s)  The format for your next appointment:   In Person  Provider:   Larae Grooms, MD     Other Instructions    Important Information About Sugar

## 2022-03-31 ENCOUNTER — Encounter (HOSPITAL_COMMUNITY)
Admission: RE | Admit: 2022-03-31 | Discharge: 2022-03-31 | Disposition: A | Discharge status: Home / Self Care | Payer: Medicare Other | Source: Ambulatory Visit | Attending: Interventional Cardiology | Admitting: Interventional Cardiology

## 2022-03-31 DIAGNOSIS — Z955 Presence of coronary angioplasty implant and graft: Secondary | ICD-10-CM | POA: Diagnosis not present

## 2022-03-31 DIAGNOSIS — Z48812 Encounter for surgical aftercare following surgery on the circulatory system: Secondary | ICD-10-CM | POA: Insufficient documentation

## 2022-04-02 ENCOUNTER — Encounter (HOSPITAL_COMMUNITY)
Admission: RE | Admit: 2022-04-02 | Discharge: 2022-04-02 | Disposition: A | Discharge status: Home / Self Care | Payer: Medicare Other | Source: Ambulatory Visit | Attending: Interventional Cardiology | Admitting: Interventional Cardiology

## 2022-04-02 DIAGNOSIS — Z48812 Encounter for surgical aftercare following surgery on the circulatory system: Secondary | ICD-10-CM | POA: Diagnosis not present

## 2022-04-02 DIAGNOSIS — Z955 Presence of coronary angioplasty implant and graft: Secondary | ICD-10-CM

## 2022-04-05 ENCOUNTER — Encounter (HOSPITAL_COMMUNITY)
Admission: RE | Admit: 2022-04-05 | Discharge: 2022-04-05 | Disposition: A | Discharge status: Home / Self Care | Payer: Medicare Other | Source: Ambulatory Visit | Attending: Interventional Cardiology | Admitting: Interventional Cardiology

## 2022-04-05 DIAGNOSIS — Z955 Presence of coronary angioplasty implant and graft: Secondary | ICD-10-CM | POA: Diagnosis not present

## 2022-04-05 DIAGNOSIS — Z48812 Encounter for surgical aftercare following surgery on the circulatory system: Secondary | ICD-10-CM | POA: Diagnosis not present

## 2022-04-06 ENCOUNTER — Other Ambulatory Visit: Payer: Self-pay

## 2022-04-07 ENCOUNTER — Encounter (HOSPITAL_COMMUNITY)
Admission: RE | Admit: 2022-04-07 | Discharge: 2022-04-07 | Disposition: A | Discharge status: Home / Self Care | Payer: Medicare Other | Source: Ambulatory Visit | Attending: Interventional Cardiology | Admitting: Interventional Cardiology

## 2022-04-07 DIAGNOSIS — Z955 Presence of coronary angioplasty implant and graft: Secondary | ICD-10-CM

## 2022-04-07 DIAGNOSIS — Z48812 Encounter for surgical aftercare following surgery on the circulatory system: Secondary | ICD-10-CM | POA: Diagnosis not present

## 2022-04-09 ENCOUNTER — Encounter (HOSPITAL_COMMUNITY)
Admission: RE | Admit: 2022-04-09 | Discharge: 2022-04-09 | Disposition: A | Discharge status: Home / Self Care | Payer: Medicare Other | Source: Ambulatory Visit | Attending: Interventional Cardiology | Admitting: Interventional Cardiology

## 2022-04-09 VITALS — Ht 60.0 in | Wt 174.8 lb

## 2022-04-09 DIAGNOSIS — Z955 Presence of coronary angioplasty implant and graft: Secondary | ICD-10-CM

## 2022-04-09 DIAGNOSIS — Z48812 Encounter for surgical aftercare following surgery on the circulatory system: Secondary | ICD-10-CM | POA: Diagnosis not present

## 2022-04-14 ENCOUNTER — Ambulatory Visit (INDEPENDENT_AMBULATORY_CARE_PROVIDER_SITE_OTHER): Payer: Medicare Other | Admitting: Family Medicine

## 2022-04-14 ENCOUNTER — Encounter: Payer: Self-pay | Admitting: Family Medicine

## 2022-04-14 VITALS — BP 95/60 | HR 57 | Resp 16 | Ht 63.0 in | Wt 174.0 lb

## 2022-04-14 DIAGNOSIS — Z6835 Body mass index (BMI) 35.0-35.9, adult: Secondary | ICD-10-CM | POA: Diagnosis not present

## 2022-04-14 DIAGNOSIS — I2510 Atherosclerotic heart disease of native coronary artery without angina pectoris: Secondary | ICD-10-CM

## 2022-04-14 DIAGNOSIS — Z0000 Encounter for general adult medical examination without abnormal findings: Secondary | ICD-10-CM

## 2022-04-14 DIAGNOSIS — I10 Essential (primary) hypertension: Secondary | ICD-10-CM

## 2022-04-14 DIAGNOSIS — I480 Paroxysmal atrial fibrillation: Secondary | ICD-10-CM

## 2022-04-14 DIAGNOSIS — Z1389 Encounter for screening for other disorder: Secondary | ICD-10-CM

## 2022-04-14 DIAGNOSIS — R319 Hematuria, unspecified: Secondary | ICD-10-CM | POA: Diagnosis not present

## 2022-04-14 DIAGNOSIS — R739 Hyperglycemia, unspecified: Secondary | ICD-10-CM | POA: Diagnosis not present

## 2022-04-14 DIAGNOSIS — E782 Mixed hyperlipidemia: Secondary | ICD-10-CM | POA: Diagnosis not present

## 2022-04-14 DIAGNOSIS — Z1211 Encounter for screening for malignant neoplasm of colon: Secondary | ICD-10-CM

## 2022-04-14 NOTE — Progress Notes (Signed)
Annual Wellness Visit  I,Barbara Fletcher,acting as a scribe for [name], MD.,have documented all relevant documentation on the behalf of [name], MD,as directed by  [name], MD while in the presence of [name], MD.     Patient: Barbara Fletcher, Female    DOB: 1951/01/15, 71 y.o.   MRN: [id number] Visit Date: 04/14/2022  Today's Provider: [name], MD   Chief Complaint  Patient presents with   [employer] is a 71 y.o. female who presents today for her Annual Wellness Visit.Annual Physical. She reports consuming a general diet. Home exercise routine includes some walking. She generally feels fairly well. She reports sleeping fairly well. She does not have additional problems to discuss today.  He has had coronary stenting since her last visit and has not gone through cardiac rehab. She fell recently and has a lot of bruising of her left leg but is able to bear weight without severe pain.  Is improving a lot. HPI    Medications: Outpatient Medications Prior to Visit  Medication Sig   acetaminophen (TYLENOL) 500 MG tablet Take 1,000 mg by mouth every 6 (six) hours as needed (Arthritis).   amLODipine (NORVASC) 5 MG tablet TAKE 1 TABLET BY MOUTH DAILY   apixaban (ELIQUIS) 5 MG TABS tablet Take 1 tablet (5 mg total) by mouth 2 (two) times daily.   ascorbic acid (VITAMIN C) 500 MG tablet Take 500 mg by mouth daily. Chewable   carboxymethylcellulose (REFRESH PLUS) 0.5 % SOLN Place 1 drop into both eyes daily as needed (dry eyes).   clopidogrel (PLAVIX) 75 MG tablet Take 1 tablet (75 mg total) by mouth daily.   diclofenac Sodium (VOLTAREN) 1 % GEL Apply 2 g topically daily as needed (pain).   famotidine (PEPCID) 10 MG tablet Take 10 mg by mouth daily.   fexofenadine-pseudoephedrine (ALLEGRA-D 24) 180-240 MG per 24 hr tablet Take 1 tablet by mouth daily as needed (allergies). Seasonal allergies    FIBER ADULT GUMMIES PO Take 2 tablets by mouth daily.   hydrochlorothiazide (HYDRODIURIL) 25 MG tablet One half tablet (12.5 mg ) or one tablet (25 mg) as needed for BP/swelling.   ketoconazole (NIZORAL) 2 % cream as needed. Heat rash   Lidocaine 4 % PTCH Apply 1 patch topically daily as needed (pain).   metoprolol tartrate (LOPRESSOR) 50 MG tablet Take 1 tablet (50 mg) twice daily.   Omega-3 Fatty Acids (FISH OIL) 1200 MG CAPS Take 1,200 mg by mouth in the morning.   PRESCRIPTION MEDICATION Apply 1 application. topically daily as needed (rash). Hydrocortisone 2% equal part with Ketoconazole cream 2 % (mix together equal parts)   rosuvastatin (CRESTOR) 40 MG tablet Take 1 tablet (40 mg total) by mouth daily.   Vitamin D, Ergocalciferol, 2000 units CAPS Take 2,000 Units by mouth daily.   alendronate (FOSAMAX) 70 MG tablet Take 1 tablet (70 mg total) by mouth once a week. Take with a full glass of water on an empty stomach. (Patient not taking: Reported on 04/14/2022)   nitroGLYCERIN (NITROSTAT) 0.4 MG SL tablet Place 1 tablet (0.4 mg total) under the tongue every 5 (five) minutes as needed for chest pain.   pregabalin (LYRICA) 75 MG capsule TAKE 1 CAPSULE BY MOUTH TWO TIMES DAILY   No facility-administered medications prior to visit.    Allergies  Allergen Reactions   Levsin [Hyoscyamine Sulfate] Hives and Shortness Of  Breath   Levsin [Hyoscyamine]    Lisinopril Swelling    Facial swelling, no breathing issues    Patient Care Team: Jerrol Banana., MD as PCP - General (Unknown Physician Specialty) Jettie Booze, MD as PCP - Cardiology (Cardiology)  Review of Systems  Gastrointestinal:  Positive for constipation and diarrhea.  Genitourinary:  Positive for hematuria.  Musculoskeletal:  Positive for arthralgias, gait problem, joint swelling, myalgias, neck pain and neck stiffness.  Neurological:  Positive for numbness.  Hematological:  Bruises/bleeds easily.  All other  systems reviewed and are negative.   Last hemoglobin A1c Lab Results  Component Value Date   HGBA1C 6.2 (H) 04/14/2022        Objective    Vitals: BP 95/60 (BP Location: Right Arm, Patient Position: Sitting, Cuff Size: Normal)   Pulse (!) 57   Resp 16   Ht '5\' 3"'$  (1.6 m)   Wt 174 lb (78.9 kg)   SpO2 99%   BMI 30.82 kg/m  BP Readings from Last 3 Encounters:  04/14/22 95/60  03/30/22 114/60  02/11/22 124/70   Wt Readings from Last 3 Encounters:  04/14/22 174 lb (78.9 kg)  04/09/22 174 lb 13.2 oz (79.3 kg)  03/30/22 172 lb (78 kg)       Physical Exam Vitals reviewed.  Constitutional:      Appearance: She is well-developed.  HENT:     Head: Normocephalic and atraumatic.     Right Ear: External ear normal.     Left Ear: External ear normal.     Nose: Nose normal.  Eyes:     Conjunctiva/sclera: Conjunctivae normal.     Pupils: Pupils are equal, round, and reactive to light.  Cardiovascular:     Rate and Rhythm: Normal rate and regular rhythm.     Heart sounds: Normal heart sounds.  Pulmonary:     Effort: Pulmonary effort is normal.     Breath sounds: Normal breath sounds.  Abdominal:     General: Bowel sounds are normal.     Palpations: Abdomen is soft.  Musculoskeletal:     Cervical back: Normal range of motion and neck supple.     Comments: She has a lot of bruising/ecchymosis around the left knee and left lower thigh from recent fall.  Minimal tenderness and she is able to weight-bear without difficulty  Skin:    General: Skin is warm and dry.  Neurological:     Mental Status: She is alert and oriented to person, place, and time.  Psychiatric:        Behavior: Behavior normal.        Thought Content: Thought content normal.        Judgment: Judgment normal.      Most recent functional status assessment:    04/14/2022   10:14 AM  In your present state of health, do you have any difficulty performing the following activities:  Hearing? 0  Vision? 0   Difficulty concentrating or making decisions? 0  Walking or climbing stairs? 1  Dressing or bathing? 0  Doing errands, shopping? 0   Most recent fall risk assessment:    02/11/2022    2:16 PM  Fall Risk   Falls in the past year? 1  Number falls in past yr: 0  Injury with Fall? 1  Risk for fall due to : History of fall(s);Impaired balance/gait;Impaired mobility  Follow up Falls evaluation completed    Most recent depression screenings:    04/14/2022  10:14 AM 02/11/2022    3:45 PM  PHQ 2/9 Scores  PHQ - 2 Score 0 0  PHQ- 9 Score 0    Most recent cognitive screening:     No data to display         Most recent Audit-C alcohol use screening    04/14/2022   10:13 AM  Alcohol Use Disorder Test (AUDIT)  1. How often do you have a drink containing alcohol? 2  2. How many drinks containing alcohol do you have on a typical day when you are drinking? 0  3. How often do you have six or more drinks on one occasion? 0  AUDIT-C Score 2   A score of 3 or more in women, and 4 or more in men indicates increased risk for alcohol abuse, EXCEPT if all of the points are from question 1   No results found for any visits on 04/14/22.  Assessment & Plan     Annual wellness visit done today including the all of the following: Reviewed patient's Family Medical History Reviewed and updated list of patient's medical providers Assessment of cognitive impairment was done Assessed patient's functional ability Established a written schedule for health screening services Health Risk Assessent Completed and Reviewed  Exercise Activities and Dietary recommendations  Goals   None     Immunization History  Administered Date(s) Administered   Fluad Quad(high Dose 65+) 07/09/2020   Influenza Split 09/11/2011   Moderna Covid Bivalent Peds Booster(76moThru 598yr 09/18/2021   PFIZER(Purple Top)SARS-COV-2 Vaccination 12/07/2019, 12/28/2019, 07/09/2020   PNEUMOCOCCAL CONJUGATE-20 10/12/2021    Pneumococcal Conjugate-13 03/10/2017   Tdap 03/10/2017   Zoster Recombinat (Shingrix) 10/20/2021    Health Maintenance  Topic Date Due   FOOT EXAM  Never done   Diabetic kidney evaluation - Urine ACR  Never done   COLONOSCOPY (Pts [id number] coverage will need to be confirmed)  Never done   OPHTHALMOLOGY EXAM  01/26/2018   COVID-19 Vaccine (5 - Pfizer risk series) 11/13/2021   HEMOGLOBIN A1C  12/15/2021   Zoster Vaccines- Shingrix (2 of 2) 12/15/2021   INFLUENZA VACCINE  03/30/2022   MAMMOGRAM  06/30/2022   Diabetic kidney evaluation - GFR measurement  12/05/2022   TETANUS/TDAP  03/11/2027   Pneumonia Vaccine 65+38ears old  Completed   DEXA SCAN  Completed   Hepatitis C Screening  Completed   HPV VACCINES  Aged Out   1. Encounter for Medicare annual wellness exam   2. Annual physical exam Refer to GI for screening colonoscopy  3. Mixed hyperlipidemia  - CBC w/Diff/Platelet - Comprehensive Metabolic Panel (CMET) - Hemoglobin A1c  4. Coronary artery disease involving native coronary artery of native heart without angina pectoris All risk factors treated.  Recent coronary stenting per cardiology She is presently on dual platelet therapy 5. Hyperglycemia  - CBC w/Diff/Platelet - Comprehensive Metabolic Panel (CMET) - Hemoglobin A1c  6. Class 2 severe obesity due to excess calories with serious comorbidity and body mass index (BMI) of 35.0 to 35.9 in adult ([facility]. - CBC w/Diff/Platelet - Comprehensive Metabolic Panel (CMET) - Hemoglobin A1c  7. Paroxysmal atrial fibrillation (HCC) Followed by cardiology - CBC w/Diff/Platelet - Comprehensive Metabolic Panel (CMET) - Hemoglobin A1c  8. Primary hypertension Pressure low today.  Would consider cutting back on amlodipine dosing - CBC w/Diff/Platelet - Comprehensive Metabolic Panel (CMET) - Hemoglobin A1c  9. Encounter for screening colonoscopy  - Ambulatory referral to  Gastroenterology  10. Screening for blood  or protein in urine  - POCT urinalysis dipstick  11. Hematuria, unspecified type  - POCT urinalysis dipstick - CULTURE, URINE COMPREHENSIVE   Discussed health benefits of physical activity, and encouraged her to engage in regular exercise appropriate for her age and condition.       No follow-ups on file.     I, [name], MD, have reviewed all documentation for this visit. The documentation on 04/19/22 for the exam, diagnosis, procedures, and orders are all accurate and complete.    Marilyn Nihiser Cranford Mon, MD  [facility] [phone] (phone) [phone] (fax)  [place]

## 2022-04-15 ENCOUNTER — Other Ambulatory Visit: Payer: Self-pay | Admitting: *Deleted

## 2022-04-15 LAB — COMPREHENSIVE METABOLIC PANEL
ALT: 15 IU/L (ref 0–32)
AST: 27 IU/L (ref 0–40)
Albumin/Globulin Ratio: 1.9 (ref 1.2–2.2)
Albumin: 4.3 g/dL (ref 3.9–4.9)
Alkaline Phosphatase: 59 IU/L (ref 44–121)
BUN/Creatinine Ratio: 14 (ref 12–28)
BUN: 16 mg/dL (ref 8–27)
Bilirubin Total: 0.6 mg/dL (ref 0.0–1.2)
CO2: 22 mmol/L (ref 20–29)
Calcium: 10.2 mg/dL (ref 8.7–10.3)
Chloride: 106 mmol/L (ref 96–106)
Creatinine, Ser: 1.12 mg/dL — ABNORMAL HIGH (ref 0.57–1.00)
Globulin, Total: 2.3 g/dL (ref 1.5–4.5)
Glucose: 113 mg/dL — ABNORMAL HIGH (ref 70–99)
Potassium: 4.7 mmol/L (ref 3.5–5.2)
Sodium: 140 mmol/L (ref 134–144)
Total Protein: 6.6 g/dL (ref 6.0–8.5)
eGFR: 53 mL/min/{1.73_m2} — ABNORMAL LOW (ref >59)

## 2022-04-15 LAB — HEMOGLOBIN A1C
Est. average glucose Bld gHb Est-mCnc: 131 mg/dL
Hgb A1c MFr Bld: 6.2 % — ABNORMAL HIGH (ref 4.8–5.6)

## 2022-04-15 LAB — CBC WITH DIFFERENTIAL/PLATELET
Basophils Absolute: 0.1 10*3/uL (ref 0.0–0.2)
Basos: 1 %
EOS (ABSOLUTE): 0.3 10*3/uL (ref 0.0–0.4)
Eos: 4 %
Hematocrit: 29 % — ABNORMAL LOW (ref 34.0–46.6)
Hemoglobin: 9.6 g/dL — ABNORMAL LOW (ref 11.1–15.9)
Immature Grans (Abs): 0 10*3/uL (ref 0.0–0.1)
Immature Granulocytes: 0 %
Lymphocytes Absolute: 1.9 10*3/uL (ref 0.7–3.1)
Lymphs: 27 %
MCH: 30 pg (ref 26.6–33.0)
MCHC: 33.1 g/dL (ref 31.5–35.7)
MCV: 91 fL (ref 79–97)
Monocytes Absolute: 0.6 10*3/uL (ref 0.1–0.9)
Monocytes: 8 %
Neutrophils Absolute: 4.1 10*3/uL (ref 1.4–7.0)
Neutrophils: 60 %
Platelets: 244 10*3/uL (ref 150–450)
RBC: 3.2 x10E6/uL — ABNORMAL LOW (ref 3.77–5.28)
RDW: 14.6 % (ref 11.7–15.4)
WBC: 6.9 10*3/uL (ref 3.4–10.8)

## 2022-04-15 LAB — POCT URINALYSIS DIPSTICK
Bilirubin, UA: NEGATIVE
Blood, UA: POSITIVE
Glucose, UA: NEGATIVE
Ketones, UA: NEGATIVE
Nitrite, UA: POSITIVE
Protein, UA: POSITIVE — AB
Spec Grav, UA: 1.02 (ref 1.010–1.025)
Urobilinogen, UA: 0.2 E.U./dL
pH, UA: 7.5 (ref 5.0–8.0)

## 2022-04-16 ENCOUNTER — Other Ambulatory Visit: Payer: Self-pay | Admitting: *Deleted

## 2022-04-16 DIAGNOSIS — I10 Essential (primary) hypertension: Secondary | ICD-10-CM

## 2022-04-16 DIAGNOSIS — R739 Hyperglycemia, unspecified: Secondary | ICD-10-CM

## 2022-04-16 DIAGNOSIS — I480 Paroxysmal atrial fibrillation: Secondary | ICD-10-CM

## 2022-04-17 LAB — CULTURE, URINE COMPREHENSIVE

## 2022-04-18 ENCOUNTER — Other Ambulatory Visit: Payer: Self-pay

## 2022-04-18 MED FILL — Pregabalin Cap 75 MG: ORAL | 30 days supply | Qty: 60 | Fill #5 | Status: AC

## 2022-04-19 ENCOUNTER — Encounter: Payer: Self-pay | Admitting: Family Medicine

## 2022-04-19 ENCOUNTER — Other Ambulatory Visit: Payer: Self-pay | Admitting: *Deleted

## 2022-04-19 ENCOUNTER — Other Ambulatory Visit: Payer: Self-pay

## 2022-04-19 DIAGNOSIS — N390 Urinary tract infection, site not specified: Secondary | ICD-10-CM

## 2022-04-19 MED ORDER — NITROFURANTOIN MONOHYD MACRO 100 MG PO CAPS
100.0000 mg | ORAL_CAPSULE | Freq: Two times a day (BID) | ORAL | 0 refills | Status: DC
  Filled 2022-04-19: qty 10, 5d supply, fill #0

## 2022-04-19 NOTE — Telephone Encounter (Signed)
Nitrofurantoin 100 mg twice daily for 5 days with a couple refills.

## 2022-04-21 ENCOUNTER — Encounter: Payer: Self-pay | Admitting: Family Medicine

## 2022-04-22 DIAGNOSIS — M25562 Pain in left knee: Secondary | ICD-10-CM | POA: Diagnosis not present

## 2022-04-22 NOTE — Progress Notes (Signed)
Discharge Progress Report  Patient Details  Name: MARYETTA SHAFER MRN: [id number] Date of Birth: 1950-12-27 Referring Provider:   Flowsheet Row INTENSIVE CARDIAC REHAB ORIENT from 02/11/2022 in [place]  Referring Provider Larae Grooms, MD        Number of Visits: 25  Reason for Discharge:  Patient reached a stable level of exercise. Patient independent in their exercise. Patient has met program and personal goals.  Smoking History:  Social History   Tobacco Use  Smoking Status Never  Smokeless Tobacco Never    Diagnosis:  11/19/21 S/P DES LAD, PTCA 1st diagonal   ADL UCSD:   Initial Exercise Prescription:  Initial Exercise Prescription - 02/11/22 1400       Date of Initial Exercise RX and Referring Provider   Date 02/11/22    Referring Provider Larae Grooms, MD    Expected Discharge Date 04/09/22      NuStep   Level 1    SPM 75    Minutes 30    METs 2      Prescription Details   Frequency (times per week) 3    Duration Progress to 30 minutes of continuous aerobic without signs/symptoms of physical distress      Intensity   THRR 40-80% of Max Heartrate 60-120    Ratings of Perceived Exertion 11-13    Perceived Dyspnea 0-4      Progression   Progression Continue progressive overload as per policy without signs/symptoms or physical distress.      Resistance Training   Training Prescription Yes    Weight 2 lbs    Reps 10-15             Discharge Exercise Prescription (Final Exercise Prescription Changes):  Exercise Prescription Changes - 04/09/22 1400       Response to Exercise   Blood Pressure (Admit) 114/70    Blood Pressure (Exercise) 132/72    Blood Pressure (Exit) 130/68    Heart Rate (Admit) 57 bpm    Heart Rate (Exercise) 101 bpm    Heart Rate (Exit) 59 bpm    Rating of Perceived Exertion (Exercise) 13    Symptoms None    Comments Pt graduated from the CRP2 program today    Duration  Continue with 30 min of aerobic exercise without signs/symptoms of physical distress.    Intensity THRR unchanged      Progression   Progression Continue to progress workloads to maintain intensity without signs/symptoms of physical distress.    Average METs 3.6      Resistance Training   Training Prescription Yes    Weight 2 lbs    Reps 10-15    Time 10 Minutes      Interval Training   Interval Training No      NuStep   Level 2    Minutes 30    METs 3.6      Home Exercise Plan   Plans to continue exercise at Home (comment)    Frequency Add 4 additional days to program exercise sessions.    Initial Home Exercises Provided 03/08/22             Functional Capacity:  6 Minute Walk     Row Name 02/11/22 1300 04/05/22 1417       6 Minute Walk   Phase Initial Discharge    Distance 1168 feet 1539 feet    Distance % Change -- 31.76 %    Distance Feet Change --  371 ft    Walk Time 6 minutes 6 minutes    # of Rest Breaks 0 0    MPH 2.21 2.91    METS 2.08 3.1    RPE 11 12    Perceived Dyspnea  0 0    VO2 Peak 7.27 10.83    Symptoms No No    Resting HR 60 bpm 82 bpm    Resting BP 124/70 118/60    Resting Oxygen Saturation  98 % --    Exercise Oxygen Saturation  during 6 min walk 98 % --    Max Ex. HR 92 bpm 113 bpm    Max Ex. BP 130/68 150/68    2 Minute Post BP 118/70 --             Psychological, QOL, Others - Outcomes: PHQ 2/9:    04/14/2022   10:14 AM 02/11/2022    3:45 PM 10/12/2021   11:23 AM 06/21/2018    9:41 AM 03/10/2017    9:06 AM  Depression screen PHQ 2/9  Decreased Interest 0 0 0 0 0  Down, Depressed, Hopeless 0 0 0 0 0  PHQ - 2 Score 0 0 0 0 0  Altered sleeping 0  0  0  Tired, decreased energy 0  0  0  Change in appetite 0  0  0  Feeling bad or failure about yourself  0  0  0  Trouble concentrating 0  0  0  Moving slowly or fidgety/restless 0  0  0  Suicidal thoughts 0  0  0  PHQ-9 Score 0  0  0  Difficult doing work/chores Not  difficult at all  Not difficult at all      Quality of Life:  Quality of Life - 04/05/22 1500       Quality of Life Scores   Health/Function Pre 24.1 %    Health/Function Post 28.13 %    Health/Function % Change 16.72 %    Socioeconomic Pre 23.5 %    Socioeconomic Post 28.07 %    Socioeconomic % Change  19.45 %    Psych/Spiritual Pre 29.14 %    Psych/Spiritual Post 26.57 %    Psych/Spiritual % Change -8.82 %    Family Pre 26.4 %    Family Post 27.6 %    Family % Change 4.55 %    GLOBAL Pre 25.3 %    GLOBAL Post 27.72 %    GLOBAL % Change 9.57 %             Personal Goals: Goals established at orientation with interventions provided to work toward goal.  Personal Goals and Risk Factors at Admission - 02/11/22 1422       Core Components/Risk Factors/Patient Goals on Admission    Weight Management Yes;Weight Loss    Intervention Weight Management: Develop a combined nutrition and exercise program designed to reach desired caloric intake, while maintaining appropriate intake of nutrient and fiber, sodium and fats, and appropriate energy expenditure required for the weight goal.;Weight Management: Provide education and appropriate resources to help participant work on and attain dietary goals.;Weight Management/Obesity: Establish reasonable short term and long term weight goals.;Obesity: Provide education and appropriate resources to help participant work on and attain dietary goals.    Admit Weight 181 lb 3.5 oz (82.2 kg)    Expected Outcomes Short Term: Continue to assess and modify interventions until short term weight is achieved;Long Term: Adherence to nutrition and physical activity/exercise  program aimed toward attainment of established weight goal;Weight Maintenance: Understanding of the daily nutrition guidelines, which includes 25-35% calories from fat, 7% or less cal from saturated fats, less than 265m cholesterol, less than 1.5gm of sodium, & 5 or more servings of  fruits and vegetables daily;Weight Loss: Understanding of general recommendations for a balanced deficit meal plan, which promotes 1-2 lb weight loss per week and includes a negative energy balance of [phone] kcal/d;Understanding recommendations for meals to include 15-35% energy as protein, 25-35% energy from fat, 35-60% energy from carbohydrates, less than 2042mof dietary cholesterol, 20-35 gm of total fiber daily;Understanding of distribution of calorie intake throughout the day with the consumption of 4-5 meals/snacks    Hypertension Yes    Intervention Provide education on lifestyle modifcations including regular physical activity/exercise, weight management, moderate sodium restriction and increased consumption of fresh fruit, vegetables, and low fat dairy, alcohol moderation, and smoking cessation.;Monitor prescription use compliance.    Expected Outcomes Short Term: Continued assessment and intervention until BP is < 140/9068mG in hypertensive participants. < 130/61m15m in hypertensive participants with diabetes, heart failure or chronic kidney disease.;Long Term: Maintenance of blood pressure at goal levels.    Lipids Yes    Intervention Provide education and support for participant on nutrition & aerobic/resistive exercise along with prescribed medications to achieve LDL <70mg61mL >40mg.33mExpected Outcomes Short Term: Participant states understanding of desired cholesterol values and is compliant with medications prescribed. Participant is following exercise prescription and nutrition guidelines.;Long Term: Cholesterol controlled with medications as prescribed, with individualized exercise RX and with personalized nutrition plan. Value goals: LDL < 70mg, 65m> 40 mg.    Stress Yes    Intervention Offer individual and/or small group education and counseling on adjustment to heart disease, stress management and health-related lifestyle change. Teach and support self-help strategies.;Refer  participants experiencing significant psychosocial distress to appropriate mental health specialists for further evaluation and treatment. When possible, include family members and significant others in education/counseling sessions.    Expected Outcomes Short Term: Participant demonstrates changes in health-related behavior, relaxation and other stress management skills, ability to obtain effective social support, and compliance with psychotropic medications if prescribed.;Long Term: Emotional wellbeing is indicated by absence of clinically significant psychosocial distress or social isolation.              Personal Goals Discharge:  Goals and Risk Factor Review     Row Name 02/17/22 0835 02/25/22 0809 03/24/22 1631 04/22/22 1552       Core Components/Risk Factors/Patient Goals Review   Personal Goals Review Weight Management/Obesity;Hypertension;Lipids;Stress Weight Management/Obesity;Hypertension;Lipids;Stress Weight Management/Obesity;Hypertension;Lipids;Stress Weight Management/Obesity;Hypertension;Lipids;Stress    Review [facility] rehab on 02/15/22. Corbin did well with exercise. Vital signs stable Zamira Tyshika to a good start to exercise at intensive cardiac rehab. Vital signs have been stable Olla Alashiang well with exercise at intensive caridac rehab. Nonsustained sinus brady noted Dr VaranasIrish Lacked. Asymptomatic. Will contiue to [employer] well with exercise at intensive caridac rehab. Malaina completed intensive cardiac rehab on 04/09/22    Expected Outcomes Iyesha Ellarieontinue to participate in phase 2 cardiac rehab for exercise, nutrition and lifestyle modifications Niema Rashiyaontinue to participate in phase 2 cardiac rehab for exercise, nutrition and lifestyle modifications Twilla Ameliaontinue to participate in phase 2 cardiac rehab for exercise, nutrition and lifestyle modifications Trenee Patontinue to  exercise, follow nutrition and lifestyle  modifications upon completion of intensive cardiac rehab.  Exercise Goals and Review:  Exercise Goals     Row Name 02/11/22 1414             Exercise Goals   Increase Physical Activity Yes       Intervention Provide advice, education, support and counseling about physical activity/exercise needs.;Develop an individualized exercise prescription for aerobic and resistive training based on initial evaluation findings, risk stratification, comorbidities and participant's personal goals.       Expected Outcomes Short Term: Attend rehab on a regular basis to increase amount of physical activity.;Long Term: Add in home exercise to make exercise part of routine and to increase amount of physical activity.;Long Term: Exercising regularly at least 3-5 days a week.       Increase Strength and Stamina Yes       Intervention Provide advice, education, support and counseling about physical activity/exercise needs.;Develop an individualized exercise prescription for aerobic and resistive training based on initial evaluation findings, risk stratification, comorbidities and participant's personal goals.       Expected Outcomes Short Term: Increase workloads from initial exercise prescription for resistance, speed, and METs.;Short Term: Perform resistance training exercises routinely during rehab and add in resistance training at home;Long Term: Improve cardiorespiratory fitness, muscular endurance and strength as measured by increased METs and functional capacity (6MWT)       Able to understand and use rate of perceived exertion (RPE) scale Yes       Intervention Provide education and explanation on how to use RPE scale       Expected Outcomes Short Term: Able to use RPE daily in rehab to express subjective intensity level;Long Term:  Able to use RPE to guide intensity level when exercising independently       Knowledge and understanding of Target Heart Rate Range (THRR) Yes       Intervention  Provide education and explanation of THRR including how the numbers were predicted and where they are located for reference       Expected Outcomes Short Term: Able to state/look up THRR;Short Term: Able to use daily as guideline for intensity in rehab;Long Term: Able to use THRR to govern intensity when exercising independently       Understanding of Exercise Prescription Yes       Intervention Provide education, explanation, and written materials on patient's individual exercise prescription       Expected Outcomes Short Term: Able to explain program exercise prescription;Long Term: Able to explain home exercise prescription to exercise independently                Exercise Goals Re-Evaluation:  Exercise Goals Re-Evaluation     Row Name 02/15/22 1624 03/08/22 1542 04/09/22 1412         Exercise Goal Re-Evaluation   Exercise Goals Review Increase Physical Activity;Increase Strength and Stamina;Able to understand and use rate of perceived exertion (RPE) scale;Knowledge and understanding of Target Heart Rate Range (THRR);Understanding of Exercise Prescription Increase Physical Activity;Increase Strength and Stamina;Able to understand and use rate of perceived exertion (RPE) scale;Knowledge and understanding of Target Heart Rate Range (THRR);Understanding of Exercise Prescription Increase Physical Activity;Increase Strength and Stamina;Able to understand and use rate of perceived exertion (RPE) scale;Knowledge and understanding of Target Heart Rate Range (THRR);Understanding of Exercise Prescription     Comments Pt's first day in the CRP2 program. Pt understands the exercise Rx, THRR and RPE scale. Reviewed goals and home exercise Rx. Pt has goal ti increase stength and stamina. Pt voices that she has  seen an increase in her strength and stamina but it is not where she would like to be. Her SOB has improved but she voices she still gets "winded" on hill when walking. Pt voices that she walks with  her husdand at home 3-4x/week for 30-45 minutes.on her off days from the [place] program. Pt graduated from the [place] program today. Pt made excellent progress and peak/average METs of 3.6. Pt walks an average of 3 x/week for 30 minutes in addtion to the CRP2 program for 6-7 days of exercise. Pt plans to join the [facility] and will will contiue to walk and use the [facility], if available. Pt voices improved strength and stamina which was a goal.     Expected Outcomes Will continue to monitor and progress exercise workloads as tolerated. Pt will continute to walk at home in addtion to the CRP2 program. Pt will continue to exericse at home and the [facility],  5 - 7 x/week.              Nutrition & Weight - Outcomes:  Pre Biometrics - 02/11/22 1125       Pre Biometrics   Waist Circumference 42.75 inches    Hip Circumference 51 inches    Waist to Hip Ratio 0.84 %    Triceps Skinfold 30 mm    % Body Fat 47.3 %    Grip Strength 20 kg    Flexibility --   No done due to significant back issues   Single Leg Stand 3.56 seconds             Post Biometrics - 04/09/22 1408        Post  Biometrics   Height 5' (1.524 m)    Weight 79.3 kg    Waist Circumference 41.5 inches    Hip Circumference 50.5 inches    Waist to Hip Ratio 0.82 %    BMI (Calculated) 34.14    Triceps Skinfold 28 mm    % Body Fat 45.8 %    Grip Strength 22 kg    Flexibility --   Not performed due to back problems   Single Leg Stand 7.06 seconds             Nutrition:  Nutrition Therapy & Goals - 04/09/22 1330       Nutrition Therapy   Diet Heart Healthy Diet    Drug/Food Interactions Statins/Certain Fruits      Personal Nutrition Goals   Nutrition Goal Patient to choose a variety of fruits, vegetables, whole grains, lean protein/plant protein, and nonfat dairy daily as part of heart meal plan    Personal Goal #2 Patient to reduce food frequency of and amount of refined carbohydrates and simple sugars in diet daily.     Comments Goals in progress. Patient graduates today. Patient continues to work toward a heart healthy diet per diet recall. She continues to work on weight loss as well. Reviewed label reading and answered questions regarding protein bars, etc.      Intervention Plan   Intervention Prescribe, educate and counsel regarding individualized specific dietary modifications aiming towards targeted core components such as weight, hypertension, lipid management, diabetes, heart failure and other comorbidities.    Expected Outcomes Short Term Goal: Understand basic principles of dietary content, such as calories, fat, sodium, cholesterol and nutrients.;Long Term Goal: Adherence to prescribed nutrition plan.             Nutrition Discharge:  Nutrition Assessments - 04/22/22 1771  Rate Your Plate Scores   Post Score 76             Education Questionnaire Score:  Knowledge Questionnaire Score - 04/05/22 1500       Knowledge Questionnaire Score   Post Score 23/24             Goals reviewed with patient; copy given to patient.Pt graduates from  Intensive/Traditional cardiac rehab program on 04/09/22 with completion of  25 exercise and education sessions. Pt maintained good attendance and progressed nicely during their participation in rehab as evidenced by increased MET level.   Medication list reconciled. Repeat  PHQ score-0  .  Pt has made significant lifestyle changes and should be commended for their success. Tenishia  achieved their goals during cardiac rehab.   Pt plans to continue exercise at the [facility]. Kam increased her distance on her post exercise walk test by 371 feet. We are proud of Neyah's progress! Harrell Gave RN BSN

## 2022-05-04 ENCOUNTER — Other Ambulatory Visit: Payer: Self-pay

## 2022-05-06 ENCOUNTER — Other Ambulatory Visit: Payer: Self-pay

## 2022-05-07 ENCOUNTER — Other Ambulatory Visit: Payer: Self-pay

## 2022-05-10 ENCOUNTER — Other Ambulatory Visit: Payer: Self-pay | Admitting: Cardiology

## 2022-05-11 ENCOUNTER — Other Ambulatory Visit: Payer: Self-pay

## 2022-05-11 ENCOUNTER — Other Ambulatory Visit: Payer: Self-pay | Admitting: Cardiology

## 2022-05-11 ENCOUNTER — Ambulatory Visit: Payer: Medicare Other | Admitting: Orthopaedic Surgery

## 2022-05-11 MED FILL — Clopidogrel Bisulfate Tab 75 MG (Base Equiv): ORAL | 90 days supply | Qty: 90 | Fill #0 | Status: AC

## 2022-05-17 ENCOUNTER — Other Ambulatory Visit: Payer: Self-pay

## 2022-06-11 ENCOUNTER — Other Ambulatory Visit: Payer: Self-pay

## 2022-06-27 ENCOUNTER — Other Ambulatory Visit: Payer: Self-pay | Admitting: Podiatry

## 2022-06-28 ENCOUNTER — Other Ambulatory Visit: Payer: Self-pay

## 2022-06-29 ENCOUNTER — Other Ambulatory Visit: Payer: Self-pay | Admitting: Podiatry

## 2022-06-29 ENCOUNTER — Other Ambulatory Visit: Payer: Self-pay

## 2022-06-29 ENCOUNTER — Other Ambulatory Visit: Payer: Self-pay | Admitting: Nurse Practitioner

## 2022-06-30 ENCOUNTER — Other Ambulatory Visit: Payer: Self-pay

## 2022-06-30 MED ORDER — ROSUVASTATIN CALCIUM 40 MG PO TABS
40.0000 mg | ORAL_TABLET | Freq: Every day | ORAL | 2 refills | Status: DC
  Filled 2022-06-30: qty 90, 90d supply, fill #0
  Filled 2022-09-25: qty 90, 90d supply, fill #1
  Filled 2022-12-27: qty 90, 90d supply, fill #2

## 2022-07-01 ENCOUNTER — Other Ambulatory Visit: Payer: Self-pay

## 2022-07-02 ENCOUNTER — Other Ambulatory Visit: Payer: Self-pay

## 2022-07-02 MED FILL — Pregabalin Cap 75 MG: ORAL | 30 days supply | Qty: 60 | Fill #0 | Status: AC

## 2022-07-04 ENCOUNTER — Other Ambulatory Visit: Payer: Self-pay

## 2022-07-05 ENCOUNTER — Other Ambulatory Visit: Payer: Self-pay

## 2022-07-06 DIAGNOSIS — Z1231 Encounter for screening mammogram for malignant neoplasm of breast: Secondary | ICD-10-CM | POA: Diagnosis not present

## 2022-07-06 DIAGNOSIS — M25512 Pain in left shoulder: Secondary | ICD-10-CM | POA: Diagnosis not present

## 2022-07-06 LAB — HM MAMMOGRAPHY

## 2022-07-08 ENCOUNTER — Encounter: Payer: Self-pay | Admitting: Family Medicine

## 2022-07-11 ENCOUNTER — Other Ambulatory Visit: Payer: Self-pay

## 2022-07-12 ENCOUNTER — Other Ambulatory Visit: Payer: Self-pay

## 2022-08-09 ENCOUNTER — Other Ambulatory Visit: Payer: Self-pay

## 2022-08-10 ENCOUNTER — Other Ambulatory Visit: Payer: Self-pay

## 2022-08-16 ENCOUNTER — Other Ambulatory Visit: Payer: Self-pay

## 2022-08-16 MED FILL — Clopidogrel Bisulfate Tab 75 MG (Base Equiv): ORAL | 90 days supply | Qty: 90 | Fill #1 | Status: AC

## 2022-08-30 MED FILL — Pregabalin Cap 75 MG: ORAL | 30 days supply | Qty: 60 | Fill #1 | Status: AC

## 2022-08-31 ENCOUNTER — Other Ambulatory Visit: Payer: Self-pay

## 2022-09-06 ENCOUNTER — Other Ambulatory Visit: Payer: Self-pay

## 2022-09-06 ENCOUNTER — Other Ambulatory Visit: Payer: Self-pay | Admitting: Interventional Cardiology

## 2022-09-07 ENCOUNTER — Other Ambulatory Visit: Payer: Self-pay

## 2022-09-07 MED ORDER — METOPROLOL TARTRATE 50 MG PO TABS
ORAL_TABLET | ORAL | 3 refills | Status: DC
  Filled 2022-09-07: qty 225, 90d supply, fill #0
  Filled 2022-12-27: qty 225, 90d supply, fill #1
  Filled 2023-04-19: qty 225, 90d supply, fill #2
  Filled 2023-08-11 – 2023-08-12 (×4): qty 225, 90d supply, fill #3

## 2022-09-27 ENCOUNTER — Other Ambulatory Visit: Payer: Self-pay

## 2022-09-27 MED ORDER — PENICILLIN V POTASSIUM 500 MG PO TABS
1000.0000 mg | ORAL_TABLET | ORAL | 0 refills | Status: AC
  Filled 2022-09-27: qty 28, 7d supply, fill #0

## 2022-10-06 ENCOUNTER — Other Ambulatory Visit: Payer: Self-pay

## 2022-10-12 MED FILL — Pregabalin Cap 75 MG: ORAL | 30 days supply | Qty: 60 | Fill #2 | Status: AC

## 2022-11-04 ENCOUNTER — Ambulatory Visit (INDEPENDENT_AMBULATORY_CARE_PROVIDER_SITE_OTHER): Payer: Medicare Other | Admitting: Dermatology

## 2022-11-04 ENCOUNTER — Encounter: Payer: Self-pay | Admitting: Dermatology

## 2022-11-04 VITALS — BP 103/60 | HR 51

## 2022-11-04 DIAGNOSIS — L570 Actinic keratosis: Secondary | ICD-10-CM | POA: Diagnosis not present

## 2022-11-04 DIAGNOSIS — Z1283 Encounter for screening for malignant neoplasm of skin: Secondary | ICD-10-CM | POA: Diagnosis not present

## 2022-11-04 DIAGNOSIS — L814 Other melanin hyperpigmentation: Secondary | ICD-10-CM | POA: Diagnosis not present

## 2022-11-04 DIAGNOSIS — L578 Other skin changes due to chronic exposure to nonionizing radiation: Secondary | ICD-10-CM | POA: Diagnosis not present

## 2022-11-04 DIAGNOSIS — L821 Other seborrheic keratosis: Secondary | ICD-10-CM

## 2022-11-04 DIAGNOSIS — D229 Melanocytic nevi, unspecified: Secondary | ICD-10-CM

## 2022-11-04 DIAGNOSIS — Z86007 Personal history of in-situ neoplasm of skin: Secondary | ICD-10-CM | POA: Diagnosis not present

## 2022-11-04 NOTE — Patient Instructions (Addendum)
Cryotherapy Aftercare  Wash gently with soap and water everyday.   Apply Vaseline and Band-Aid daily until healed.     Recommend taking Heliocare sun protection supplement daily in sunny weather for additional sun protection. For maximum protection on the sunniest days, you can take up to 2 capsules of regular Heliocare OR take 1 capsule of Heliocare Ultra. For prolonged exposure (such as a full day in the sun), you can repeat your dose of the supplement 4 hours after your first dose. Heliocare can be purchased at [employer], at some [facility] or at [email or web address].    Recommend Niacinamide or Nicotinamide '500mg'$  twice per day to lower risk of non-melanoma skin cancer by approximately 25%. This is usually available at [employer].   Recommend daily broad spectrum sunscreen SPF 30+ to sun-exposed areas, reapply every 2 hours as needed. Call for new or changing lesions.  Staying in the shade or wearing long sleeves, sun glasses (UVA+UVB protection) and wide brim hats (4-inch brim around the entire circumference of the hat) are also recommended for sun protection.    Melanoma ABCDEs  Melanoma is the most dangerous type of skin cancer, and is the leading cause of death from skin disease.  You are more likely to develop melanoma if you: Have light-colored skin, light-colored eyes, or red or blond hair Spend a lot of time in the sun Tan regularly, either outdoors or in a tanning bed Have had blistering sunburns, especially during childhood Have a close family member who has had a melanoma Have atypical moles or large birthmarks  Early detection of melanoma is key since treatment is typically straightforward and cure rates are extremely high if we catch it early.   The first sign of melanoma is often a change in a mole or a new dark spot.  The ABCDE system is a way of remembering the signs of melanoma.  A for asymmetry:  The two halves do not match. B for border:  The edges of  the growth are irregular. C for color:  A mixture of colors are present instead of an even brown color. D for diameter:  Melanomas are usually (but not always) greater than 35m - the size of a pencil eraser. E for evolution:  The spot keeps changing in size, shape, and color.  Please check your skin once per month between visits. You can use a small mirror in front and a large mirror behind you to keep an eye on the back side or your body.   If you see any new or changing lesions before your next follow-up, please call to schedule a visit.  Please continue daily skin protection including broad spectrum sunscreen SPF 30+ to sun-exposed areas, reapplying every 2 hours as needed when you're outdoors.   Staying in the shade or wearing long sleeves, sun glasses (UVA+UVB protection) and wide brim hats (4-inch brim around the entire circumference of the hat) are also recommended for sun protection.    Due to recent changes in healthcare laws, you may see results of your pathology and/or laboratory studies on MyChart before the doctors have had a chance to review them. We understand that in some cases there may be results that are confusing or concerning to you. Please understand that not all results are received at the same time and often the doctors may need to interpret multiple results in order to provide you with the best plan of care or course of treatment. Therefore, we ask that you please  give [place] 2 business days to thoroughly review all your results before contacting the office for clarification. Should we see a critical lab result, you will be contacted sooner.   If You Need Anything After Your Visit  If you have any questions or concerns for your doctor, please call our main line at [phone] and press option 4 to reach your doctor's medical assistant. If no one answers, please leave a voicemail as directed and we will return your call as soon as possible. Messages left after 4 pm will be  answered the following business day.   You may also send [place] a message via [place]. We typically respond to MyChart messages within 1-2 business days.  For prescription refills, please ask your pharmacy to contact our office. Our fax number is [phone].  If you have an urgent issue when the clinic is closed that cannot wait until the next business day, you can page your doctor at the number below.    Please note that while we do our best to be available for urgent issues outside of office hours, we are not available 24/7.   If you have an urgent issue and are unable to reach [place], you may choose to seek medical care at your doctor's office, retail clinic, urgent care center, or emergency room.  If you have a medical emergency, please immediately call 911 or go to the emergency department.  Pager Numbers  - Dr. Nehemiah Massed: [phone]  - Dr. Laurence Ferrari: [phone]  - Dr. Nicole Kindred: [phone]  In the event of inclement weather, please call our main line at [phone] for an update on the status of any delays or closures.  Dermatology Medication Tips: Please keep the boxes that topical medications come in in order to help keep track of the instructions about where and how to use these. Pharmacies typically print the medication instructions only on the boxes and not directly on the medication tubes.   If your medication is too expensive, please contact our office at [phone] option 4 or send [place] a message through [place].   We are unable to tell what your co-pay for medications will be in advance as this is different depending on your insurance coverage. However, we may be able to find a substitute medication at lower cost or fill out paperwork to get insurance to cover a needed medication.   If a prior authorization is required to get your medication covered by your insurance company, please allow [place] 1-2 business days to complete this process.  Drug prices often vary depending on  where the prescription is filled and some pharmacies may offer cheaper prices.  The website www.goodrx.com contains coupons for medications through different pharmacies. The prices here do not account for what the cost may be with help from insurance (it may be cheaper with your insurance), but the website can give you the price if you did not use any insurance.  - You can print the associated coupon and take it with your prescription to the pharmacy.  - You may also stop by our office during regular business hours and pick up a GoodRx coupon card.  - If you need your prescription sent electronically to a different pharmacy, notify our office through [facility] or by phone at [phone] option 4.     Si Usted Necesita Algo Despus de Su Visita  Tambin puede enviarnos un mensaje a travs de Pharmacist, community. Por lo general respondemos a los mensajes de MyChart en el transcurso de 1  a 2 das hbiles.  Para renovar recetas, por favor pida a su farmacia que se ponga en contacto con nuestra oficina. Harland Dingwall de fax es [place] [phone].  Si tiene un asunto urgente cuando la clnica est cerrada y que no puede esperar hasta el siguiente da hbil, puede llamar/localizar a su doctor(a) al nmero que aparece a continuacin.   Por favor, tenga en cuenta que aunque hacemos todo lo posible para estar disponibles para asuntos urgentes fuera del horario de [place], no estamos disponibles las 24 horas del da, los 7 das de la [place].   Si tiene un problema urgente y no puede comunicarse con nosotros, puede optar por buscar atencin mdica  en el consultorio de su doctor(a), en una clnica privada, en un centro de atencin urgente o en una sala de emergencias.  Si tiene Engineering geologist, por favor llame inmediatamente al 911 o vaya a la sala de emergencias.  Nmeros de bper  - Dr. Nehemiah Massed: [phone]  - Dra. Moye: [phone]  - Dra. Nicole Kindred: [phone]  En caso de inclemencias  del [place], por favor llame a Johnsie Kindred principal al [phone] para una actualizacin sobre el [place] de cualquier retraso o cierre.  Consejos para la medicacin en dermatologa: Por favor, guarde las cajas en las que vienen los medicamentos de uso tpico para ayudarle a seguir las instrucciones sobre dnde y cmo usarlos. Las farmacias generalmente imprimen las instrucciones del medicamento slo en las cajas y no directamente en los tubos del [place].   Si su medicamento es muy caro, por favor, pngase en contacto con Zigmund Daniel llamando al [phone] y presione la opcin 4 o envenos un mensaje a travs de Pharmacist, community.   No podemos decirle cul ser su copago por los medicamentos por adelantado ya que esto es diferente dependiendo de la cobertura de su seguro. Sin embargo, es posible que podamos encontrar un medicamento sustituto a Electrical engineer un formulario para que el seguro cubra el medicamento que se considera necesario.   Si se requiere una autorizacin previa para que su compaa de seguros [place] su medicamento, por favor permtanos de 1 a 2 das hbiles para completar este proceso.  Los precios de los medicamentos varan con frecuencia dependiendo del Environmental consultant de dnde se surte la receta y alguna farmacias pueden ofrecer precios ms baratos.  El sitio web www.goodrx.com tiene cupones para medicamentos de Airline pilot. Los precios aqu no tienen en cuenta lo que podra costar con la ayuda del seguro (puede ser ms barato con su seguro), pero el sitio web puede darle el precio si no utiliz Research scientist (physical sciences).  - Puede imprimir el cupn correspondiente y llevarlo con su receta a la farmacia.  - Tambin puede pasar por nuestra oficina durante el horario de atencin regular y Charity fundraiser una tarjeta de cupones de GoodRx.  - Si necesita que su receta se enve electrnicamente a una farmacia diferente, informe a nuestra oficina a travs de MyChart de [place] o por telfono  llamando al [phone] y presione la opcin 4.

## 2022-11-04 NOTE — Progress Notes (Signed)
Follow-Up Visit   Subjective  Barbara Fletcher is a 72 y.o. female who presents for the following: Annual Exam (Hx of SCC. Hx of actinic keratosis).  The patient presents for Total-Body Skin Exam (TBSE) for skin cancer screening and mole check.  The patient has spots, moles and lesions to be evaluated, some may be new or changing and the patient has concerns that these could be cancer.  The following portions of the chart were reviewed this encounter and updated as appropriate:  Tobacco  Allergies  Meds  Problems  Med Hx  Surg Hx  Fam Hx      Review of Systems: No other skin or systemic complaints except as noted in HPI or Assessment and Plan.   Objective  Well appearing patient in no apparent distress; mood and affect are within normal limits.  A full examination was performed including scalp, head, eyes, ears, nose, lips, neck, chest, axillae, abdomen, back, buttocks, bilateral upper extremities, bilateral lower extremities, hands, feet, fingers, toes, fingernails, and toenails. All findings within normal limits unless otherwise noted below.  Right forehead x1, left superior shoulder (hypertrophic) x1 (2) Erythematous thin papules/macules with gritty scale.    Assessment & Plan   History of Squamous Cell Carcinoma in Situ of the Skin. Right 3rd fingernail. Mohs 2019. - No evidence of recurrence today - Recommend regular full body skin exams - Recommend daily broad spectrum sunscreen SPF 30+ to sun-exposed areas, reapply every 2 hours as needed.  - Call if any new or changing lesions are noted between office visits   Lentigines - Scattered tan macules - Due to sun exposure - Benign-appearing, observe - Recommend daily broad spectrum sunscreen SPF 30+ to sun-exposed areas, reapply every 2 hours as needed. - Call for any changes  Seborrheic Keratoses - Stuck-on, waxy, tan-brown papules and/or plaques  - Benign-appearing - Discussed benign etiology and prognosis. -  Observe - Call for any changes  Melanocytic Nevi - Tan-brown and/or pink-flesh-colored symmetric macules and papules - Benign appearing on exam today - Observation - Call clinic for new or changing moles - Recommend daily use of broad spectrum spf 30+ sunscreen to sun-exposed areas.   Hemangiomas - Red papules - Discussed benign nature - Observe - Call for any changes  Actinic Damage - Chronic condition, secondary to cumulative UV/sun exposure - diffuse scaly erythematous macules with underlying dyspigmentation - Recommend daily broad spectrum sunscreen SPF 30+ to sun-exposed areas, reapply every 2 hours as needed.  - Staying in the shade or wearing long sleeves, sun glasses (UVA+UVB protection) and wide brim hats (4-inch brim around the entire circumference of the hat) are also recommended for sun protection.  - Call for new or changing lesions.  Skin cancer screening performed today.  AK (actinic keratosis) (2) Right forehead x1, left superior shoulder (hypertrophic) x1  Actinic keratoses are precancerous spots that appear secondary to cumulative UV radiation exposure/sun exposure over time. They are chronic with expected duration over 1 year. A portion of actinic keratoses will progress to squamous cell carcinoma of the skin. It is not possible to reliably predict which spots will progress to skin cancer and so treatment is recommended to prevent development of skin cancer.  Recommend daily broad spectrum sunscreen SPF 30+ to sun-exposed areas, reapply every 2 hours as needed.  Recommend staying in the shade or wearing long sleeves, sun glasses (UVA+UVB protection) and wide brim hats (4-inch brim around the entire circumference of the hat). Call for new or changing  lesions.  Destruction of lesion - Right forehead x1, left superior shoulder (hypertrophic) x1  Destruction method: cryotherapy   Informed consent: discussed and consent obtained   Lesion destroyed using liquid  nitrogen: Yes   Region frozen until ice ball extended beyond lesion: Yes   Outcome: patient tolerated procedure well with no complications   Post-procedure details: wound care instructions given   Additional details:  Prior to procedure, discussed risks of blister formation, small wound, skin dyspigmentation, or rare scar following cryotherapy. Recommend Vaseline ointment to treated areas while healing.    Return in about 1 year (around 11/04/2023) for TBSE, HxSCC, AK Follow Up in 3-4 months.  I, Emelia Salisbury, CMA, am acting as scribe for Forest Gleason, MD.  Documentation: I have reviewed the above documentation for accuracy and completeness, and I agree with the above.  Forest Gleason, MD

## 2022-11-06 ENCOUNTER — Encounter: Payer: Self-pay | Admitting: Dermatology

## 2022-11-08 ENCOUNTER — Other Ambulatory Visit: Payer: Self-pay | Admitting: Podiatry

## 2022-11-08 ENCOUNTER — Other Ambulatory Visit: Payer: Self-pay | Admitting: Interventional Cardiology

## 2022-11-08 ENCOUNTER — Other Ambulatory Visit: Payer: Self-pay | Admitting: Cardiology

## 2022-11-09 ENCOUNTER — Other Ambulatory Visit: Payer: Self-pay

## 2022-11-09 ENCOUNTER — Other Ambulatory Visit: Payer: Self-pay | Admitting: Interventional Cardiology

## 2022-11-09 ENCOUNTER — Other Ambulatory Visit: Payer: Self-pay | Admitting: Podiatry

## 2022-11-09 MED FILL — Clopidogrel Bisulfate Tab 75 MG (Base Equiv): ORAL | 90 days supply | Qty: 90 | Fill #0 | Status: AC

## 2022-11-09 MED FILL — Apixaban Tab 5 MG: ORAL | 90 days supply | Qty: 180 | Fill #0 | Status: AC

## 2022-11-09 NOTE — Telephone Encounter (Signed)
Pt last saw Dr Irish Lack 03/30/22, last labs 04/14/22 Creat 1.12, age 72, weight 78.9kg, based on specified criteria pt is on appropriate dosage of Eliquis '5mg'$  BID for afib.  Will refill rx.

## 2022-12-05 NOTE — Progress Notes (Unsigned)
Cardiology Office Note   Date:  12/06/2022   ID:  Barbara Fletcher, DOB Dec 19, 1950, MRN [id number]  PCP:  Bosie Clos, MD    No chief complaint on file.  CAD  Wt Readings from Last 3 Encounters:  12/06/22 155 lb (70.3 kg)  04/14/22 174 lb (78.9 kg)  04/09/22 174 lb 13.2 oz (79.3 kg)       History of Present Illness: Barbara Fletcher is a 72 y.o. female   who I saw in 2021 for palpitations.  She noted heart rates to 180s at that time.   Her husband is my patient.  He is a retired Transport planner.  I saw him in early February 2023.  He brought in Cartia strips from her showing atrial fibrillation with rapid ventricular response.  Eliquis was started.   Due to chest comfort, she had a CTA in 2023 which was abnormal.  This prompted cardiac catheterization which showed: "Prox Cx lesion is 30% stenosed.   Prox LAD lesion is 25% stenosed.   Mid LAD lesion is 80% stenosed.  A drug-eluting stent was successfully placed using a STENT ONYX FRONTIER 3.0X30, postdilated to 3.25 mm and optimized with intravascular ultrasound.   Post intervention, there is a 0% residual stenosis.   2nd Diag lesion is 99% stenosed.  Plaque shift into this diagonal after stenting of the LAD.  Transient loss of flow but TIMI-3 flow returned after balloon angioplasty.  Balloon angioplasty was performed using a BALLN SAPPHIRE 2.0X12.   Dist RCA lesion is 25% stenosed.   Post intervention, there is a 25% residual stenosis.   The left ventricular systolic function is normal.   LV end diastolic pressure is mildly elevated.   The left ventricular ejection fraction is 55-65% by visual estimate.   There is no aortic valve stenosis."     She felt her dyspnea was improved post PCI.   She has lost weight with portion control and increased exercise.   Exercises at the [facility].  Does some resistance training.  Has osteoporosis.   Denies : Chest pain. Dizziness. Leg edema. Nitroglycerin use. Orthopnea. Palpitations.  Paroxysmal nocturnal dyspnea. Shortness of breath. Syncope.     Past Medical History:  Diagnosis Date   Arthritis    Cervical radiculopathy    Chronic bilateral low back pain without sciatica    Class 2 severe obesity due to excess calories with serious comorbidity and body mass index (BMI) of 35.0 to 35.9 in adult    Coronary artery disease    Diverticulitis    Frequent headaches    Headaches, cluster    Hyperlipidemia    Hypertension    Osteoporosis without current pathological fracture    Palpitations    Squamous cell carcinoma of skin 11/24/2017   SCCIS, right third fingernail - tx with [facility]    Past Surgical History:  Procedure Laterality Date   ABDOMINAL HYSTERECTOMY  08/30/1988   BACK SURGERY     lumbar fussion   CARDIAC CATHETERIZATION     CESAREAN SECTION  1985, 1988   CHOLECYSTECTOMY  08/30/1984   COLOSTOMY     COLOSTOMY CLOSURE     abd diverticulitis with colostomy, reversal 2007- 2008   CORONARY ANGIOPLASTY     CORONARY STENT INTERVENTION N/A 11/19/2021   Procedure: CORONARY STENT INTERVENTION;  Surgeon: Corky Crafts, MD;  Location: [facility] INVASIVE CV LAB;  Service: Cardiovascular;  Laterality: N/A;   LAPAROTOMY  02/13/2012   Procedure: EXPLORATORY LAPAROTOMY;  Surgeon: Molli Hazard  Dwain Sarna, MD;  Location: [facility] OR;  Service: General;  Laterality: N/A;  Exploratory Laparotomy with small bowel resection, lysis of adhension and primary ventral hernia repair   LEFT HEART CATH AND CORONARY ANGIOGRAPHY N/A 11/19/2021   Procedure: LEFT HEART CATH AND CORONARY ANGIOGRAPHY;  Surgeon: Corky Crafts, MD;  Location: [facility] INVASIVE CV LAB;  Service: Cardiovascular;  Laterality: N/A;   OOPHORECTOMY     1981   TONSILLECTOMY     1958     Current Outpatient Medications  Medication Sig Dispense Refill   acetaminophen (TYLENOL) 500 MG tablet Take 1,000 mg by mouth every 6 (six) hours as needed (Arthritis).     amLODipine (NORVASC) 5 MG tablet TAKE 1 TABLET BY MOUTH DAILY  90 tablet 3   apixaban (ELIQUIS) 5 MG TABS tablet Take 1 tablet (5 mg total) by mouth 2 (two) times daily. 180 tablet 1   ascorbic acid (VITAMIN C) 500 MG tablet Take 500 mg by mouth daily. Chewable     carboxymethylcellulose (REFRESH PLUS) 0.5 % SOLN Place 1 drop into both eyes daily as needed (dry eyes).     clopidogrel (PLAVIX) 75 MG tablet Take 1 tablet (75 mg total) by mouth daily. 90 tablet 1   diclofenac Sodium (VOLTAREN) 1 % GEL Apply 2 g topically daily as needed (pain).     famotidine (PEPCID) 10 MG tablet Take 10 mg by mouth daily.     fexofenadine-pseudoephedrine (ALLEGRA-D 24) 180-240 MG per 24 hr tablet Take 1 tablet by mouth daily as needed (allergies). Seasonal allergies     FIBER ADULT GUMMIES PO Take 2 tablets by mouth daily.     hydrochlorothiazide (HYDRODIURIL) 25 MG tablet One half tablet (12.5 mg ) or one tablet (25 mg) as needed for BP/swelling. 30 tablet 9   ketoconazole (NIZORAL) 2 % cream as needed. Heat rash     Lidocaine 4 % PTCH Apply 1 patch topically daily as needed (pain).     meloxicam (MOBIC) 15 MG tablet Take 1 tablet (15 mg total) by mouth daily. 30 tablet 3   metoprolol tartrate (LOPRESSOR) 50 MG tablet Take 1 tablet (50 mg) twice daily. 270 tablet 3   Omega-3 Fatty Acids (FISH OIL) 1200 MG CAPS Take 1,200 mg by mouth in the morning.     pregabalin (LYRICA) 75 MG capsule TAKE 1 CAPSULE BY MOUTH TWO TIMES DAILY 60 capsule 5   PRESCRIPTION MEDICATION Apply 1 application. topically daily as needed (rash). Hydrocortisone 2% equal part with Ketoconazole cream 2 % (mix together equal parts)     rosuvastatin (CRESTOR) 40 MG tablet Take 1 tablet (40 mg total) by mouth daily. 90 tablet 2   Vitamin D, Ergocalciferol, 2000 units CAPS Take 2,000 Units by mouth daily.     alendronate (FOSAMAX) 70 MG tablet Take 1 tablet (70 mg total) by mouth once a week. Take with a full glass of water on an empty stomach. (Patient not taking: Reported on 04/14/2022) 12 tablet 4    metoprolol tartrate (LOPRESSOR) 50 MG tablet Take 1 tablet (50 mg) every morning, 1/2 tablet (25 mg) at lunch time, and 1 tablet (50 mg) in the evening (Patient not taking: Reported on 12/06/2022) 270 tablet 3   nitrofurantoin, macrocrystal-monohydrate, (MACROBID) 100 MG capsule Take 1 capsule (100 mg total) by mouth 2 (two) times daily. (Patient not taking: Reported on 12/06/2022) 10 capsule 0   nitroGLYCERIN (NITROSTAT) 0.4 MG SL tablet Place 1 tablet (0.4 mg total) under the tongue every 5 (five)  minutes as needed for chest pain. 25 tablet 3   penicillin v potassium (VEETID) 500 MG tablet Take 2 tablets (1,000 mg total) by mouth now, then 1 tablet 4 times a day undtil gone. (Patient not taking: Reported on 12/06/2022) 28 tablet 0   No current facility-administered medications for this visit.    Allergies:   Levsin [hyoscyamine sulfate], Hyoscyamine, and Lisinopril    Social History:  The patient  reports that she has never smoked. She has never used smokeless tobacco. She reports current alcohol use of about 1.0 standard drink of alcohol per week. She reports that she does not use drugs.   Family History:  The patient's family history includes Heart failure in her mother; Other in her mother.    ROS:  Please see the history of present illness.   Otherwise, review of systems are positive for intentional weight loss.   All other systems are reviewed and negative.    PHYSICAL EXAM: VS:  BP 130/68   Pulse (!) 50   Ht 5\' 1"  (1.549 m)   Wt 155 lb (70.3 kg)   SpO2 97%   BMI 29.29 kg/m  , BMI Body mass index is 29.29 kg/m. GEN: Well nourished, well developed, in no acute distress HEENT: normal Neck: no JVD, carotid bruits, or masses Cardiac: RRR; no murmurs, rubs, or gallops,no edema  Respiratory:  clear to auscultation bilaterally, normal work of breathing GI: soft, nontender, nondistended, + BS MS: no deformity or atrophy Skin: warm and dry, no rash Neuro:  Strength and sensation are  intact Psych: euthymic mood, full affect   EKG:   The ekg ordered 10/23 demonstrates NSR, no ST changes, PRWP   Recent Labs: 04/14/2022: ALT 15; BUN 16; Creatinine, Ser 1.12; Hemoglobin 9.6; Platelets 244; Potassium 4.7; Sodium 140   Lipid Panel    Component Value Date/Time   CHOL 99 (L) 01/13/2022 1105   TRIG 96 01/13/2022 1105   HDL 56 01/13/2022 1105   CHOLHDL 1.8 01/13/2022 1105   LDLCALC 25 01/13/2022 1105     Other studies Reviewed: Additional studies/ records that were reviewed today with results demonstrating: labs reviewed.   ASSESSMENT AND PLAN:  CAD: Status post LAD stent. No bleeding problems.  COntinue Plavix.   No angina.  Refill nitroglycerin. Hypertension: Low-salt diet. The current medical regimen is effective;  continue present plan and medications. PAF: Eliquis for stroke prevention. No sustained sx. Hyperlipidemia: Continue rosuvastatin.  Whole food, plant-based diet.  High-fiber diet.  Avoid processed foods.  Labs with Dr. Sullivan LoneGilbert.  PreDM: A1C 6.2.  Dietary recommendations and exercise recommendations given.   Current medicines are reviewed at length with the patient today.  The patient concerns regarding her medicines were addressed.  The following changes have been made:  No change  Labs/ tests ordered today include:  No orders of the defined types were placed in this encounter.   Recommend 150 minutes/week of aerobic exercise Low fat, low carb, high fiber diet recommended  Disposition:   FU in 9 months   Signed, Lance MussJayadeep Ronni Osterberg, MD  12/06/2022 10:21 AM    [facility] [address], [place], [place]  [address] Phone: [phone]; Fax: [phone]

## 2022-12-06 ENCOUNTER — Encounter: Payer: Self-pay | Admitting: Interventional Cardiology

## 2022-12-06 ENCOUNTER — Ambulatory Visit: Payer: Medicare Other | Attending: Interventional Cardiology | Admitting: Interventional Cardiology

## 2022-12-06 ENCOUNTER — Other Ambulatory Visit: Payer: Self-pay

## 2022-12-06 VITALS — BP 130/68 | HR 50 | Ht 61.0 in | Wt 155.0 lb

## 2022-12-06 DIAGNOSIS — I10 Essential (primary) hypertension: Secondary | ICD-10-CM

## 2022-12-06 DIAGNOSIS — E785 Hyperlipidemia, unspecified: Secondary | ICD-10-CM | POA: Diagnosis not present

## 2022-12-06 DIAGNOSIS — R7303 Prediabetes: Secondary | ICD-10-CM | POA: Diagnosis not present

## 2022-12-06 DIAGNOSIS — I2510 Atherosclerotic heart disease of native coronary artery without angina pectoris: Secondary | ICD-10-CM

## 2022-12-06 DIAGNOSIS — I480 Paroxysmal atrial fibrillation: Secondary | ICD-10-CM

## 2022-12-06 MED ORDER — NITROGLYCERIN 0.4 MG SL SUBL
0.4000 mg | SUBLINGUAL_TABLET | SUBLINGUAL | 3 refills | Status: AC | PRN
  Filled 2022-12-06: qty 25, 5d supply, fill #0

## 2022-12-06 NOTE — Patient Instructions (Signed)
Medication Instructions:  Your physician recommends that you continue on your current medications as directed. Please refer to the Current Medication list given to you today.  *If you need a refill on your cardiac medications before your next appointment, please call your pharmacy*   Lab Work: Lab work to be done today--CBC, TSH, CMET, Lipids, A1C If you have labs (blood work) drawn today and your tests are completely normal, you will receive your results only by: MyChart Message (if you have MyChart) OR A paper copy in the mail If you have any lab test that is abnormal or we need to change your treatment, we will call you to review the results.   Testing/Procedures: none   Follow-Up: At [facility], you and your health needs are our priority.  As part of our continuing mission to provide you with exceptional heart care, we have created designated Provider Care Teams.  These Care Teams include your primary Cardiologist (physician) and Advanced Practice Providers (APPs -  Physician Assistants and Nurse Practitioners) who all work together to provide you with the care you need, when you need it.  We recommend signing up for the patient portal called "MyChart".  Sign up information is provided on this After Visit Summary.  MyChart is used to connect with patients for Virtual Visits (Telemedicine).  Patients are able to view lab/test results, encounter notes, upcoming appointments, etc.  Non-urgent messages can be sent to your provider as well.   To learn more about what you can do with MyChart, go to [email or web address].    Your next appointment:   9 month(s)  Provider:   Lance Muss, MD     Other Instructions

## 2022-12-07 ENCOUNTER — Telehealth: Payer: Self-pay | Admitting: *Deleted

## 2022-12-07 DIAGNOSIS — I10 Essential (primary) hypertension: Secondary | ICD-10-CM

## 2022-12-07 DIAGNOSIS — E875 Hyperkalemia: Secondary | ICD-10-CM

## 2022-12-07 LAB — COMPREHENSIVE METABOLIC PANEL
ALT: 12 IU/L (ref 0–32)
AST: 22 IU/L (ref 0–40)
Albumin/Globulin Ratio: 1.7 (ref 1.2–2.2)
Albumin: 4.1 g/dL (ref 3.8–4.8)
Alkaline Phosphatase: 75 IU/L (ref 44–121)
BUN/Creatinine Ratio: 20 (ref 12–28)
BUN: 18 mg/dL (ref 8–27)
Bilirubin Total: 0.2 mg/dL (ref 0.0–1.2)
CO2: 21 mmol/L (ref 20–29)
Calcium: 10 mg/dL (ref 8.7–10.3)
Chloride: 106 mmol/L (ref 96–106)
Creatinine, Ser: 0.92 mg/dL (ref 0.57–1.00)
Globulin, Total: 2.4 g/dL (ref 1.5–4.5)
Glucose: 104 mg/dL — ABNORMAL HIGH (ref 70–99)
Potassium: 5.3 mmol/L — ABNORMAL HIGH (ref 3.5–5.2)
Sodium: 141 mmol/L (ref 134–144)
Total Protein: 6.5 g/dL (ref 6.0–8.5)
eGFR: 67 mL/min/{1.73_m2} (ref >59)

## 2022-12-07 LAB — HEMOGLOBIN A1C
Est. average glucose Bld gHb Est-mCnc: 137 mg/dL
Hgb A1c MFr Bld: 6.4 % — ABNORMAL HIGH (ref 4.8–5.6)

## 2022-12-07 LAB — TSH: TSH: 1.96 u[IU]/mL (ref 0.450–4.500)

## 2022-12-07 LAB — CBC
Hematocrit: 42.5 % (ref 34.0–46.6)
Hemoglobin: 13.1 g/dL (ref 11.1–15.9)
MCH: 29.5 pg (ref 26.6–33.0)
MCHC: 30.8 g/dL — ABNORMAL LOW (ref 31.5–35.7)
MCV: 96 fL (ref 79–97)
Platelets: 291 10*3/uL (ref 150–450)
RBC: 4.44 x10E6/uL (ref 3.77–5.28)
RDW: 13.6 % (ref 11.7–15.4)
WBC: 6.9 10*3/uL (ref 3.4–10.8)

## 2022-12-07 LAB — LIPID PANEL
Chol/HDL Ratio: 1.8 ratio (ref 0.0–4.4)
Cholesterol, Total: 123 mg/dL (ref 100–199)
HDL: 70 mg/dL (ref >39)
LDL Chol Calc (NIH): 38 mg/dL (ref 0–99)
Triglycerides: 72 mg/dL (ref 0–149)
VLDL Cholesterol Cal: 15 mg/dL (ref 5–40)

## 2022-12-07 NOTE — Telephone Encounter (Signed)
Left message to call office

## 2022-12-07 NOTE — Telephone Encounter (Signed)
-----   Message from Corky Crafts, MD sent at 12/07/2022  9:32 AM EDT ----- Hemoglobin improved.  C-Met normal except for potassium which is slightly increased to 5.3.  A1c mildly increased to 6.4.  TSH normal.  Lipids very well-controlled with LDL 38.  Stay well-hydrated.  Can repeat bmet in 2 weeks to ensure that potassium is back in the normal range, or this can be done with primary care doctor.

## 2022-12-07 NOTE — Telephone Encounter (Signed)
Patient is returning call to discuss results. 

## 2022-12-07 NOTE — Telephone Encounter (Signed)
Lab results reviewed with patient.  She will come in for [facility] on December 21, 2022

## 2022-12-10 ENCOUNTER — Other Ambulatory Visit: Payer: Self-pay

## 2022-12-21 ENCOUNTER — Ambulatory Visit: Payer: Medicare Other | Attending: Interventional Cardiology

## 2022-12-21 DIAGNOSIS — E875 Hyperkalemia: Secondary | ICD-10-CM

## 2022-12-21 DIAGNOSIS — I10 Essential (primary) hypertension: Secondary | ICD-10-CM | POA: Diagnosis not present

## 2022-12-22 LAB — BASIC METABOLIC PANEL
BUN/Creatinine Ratio: 19 (ref 12–28)
BUN: 16 mg/dL (ref 8–27)
CO2: 23 mmol/L (ref 20–29)
Calcium: 9.7 mg/dL (ref 8.7–10.3)
Chloride: 107 mmol/L — ABNORMAL HIGH (ref 96–106)
Creatinine, Ser: 0.83 mg/dL (ref 0.57–1.00)
Glucose: 103 mg/dL — ABNORMAL HIGH (ref 70–99)
Potassium: 4.5 mmol/L (ref 3.5–5.2)
Sodium: 144 mmol/L (ref 134–144)
eGFR: 75 mL/min/{1.73_m2} (ref >59)

## 2023-01-05 MED FILL — Pregabalin Cap 75 MG: ORAL | 30 days supply | Qty: 60 | Fill #3 | Status: AC

## 2023-01-06 ENCOUNTER — Other Ambulatory Visit: Payer: Self-pay

## 2023-02-02 ENCOUNTER — Other Ambulatory Visit: Payer: Self-pay

## 2023-02-02 MED FILL — Apixaban Tab 5 MG: ORAL | 90 days supply | Qty: 180 | Fill #1 | Status: AC

## 2023-02-02 MED FILL — Pregabalin Cap 75 MG: ORAL | 30 days supply | Qty: 60 | Fill #4 | Status: AC

## 2023-02-03 ENCOUNTER — Other Ambulatory Visit: Payer: Self-pay

## 2023-02-04 ENCOUNTER — Other Ambulatory Visit: Payer: Self-pay

## 2023-02-06 ENCOUNTER — Other Ambulatory Visit: Payer: Self-pay

## 2023-02-08 ENCOUNTER — Other Ambulatory Visit: Payer: Self-pay

## 2023-02-08 ENCOUNTER — Telehealth: Payer: Self-pay | Admitting: Interventional Cardiology

## 2023-02-08 DIAGNOSIS — I10 Essential (primary) hypertension: Secondary | ICD-10-CM

## 2023-02-08 MED ORDER — AMLODIPINE BESYLATE 5 MG PO TABS
5.0000 mg | ORAL_TABLET | Freq: Every day | ORAL | 3 refills | Status: DC
Start: 2023-02-08 — End: 2024-02-01
  Filled 2023-02-08: qty 90, 90d supply, fill #0
  Filled 2023-05-02: qty 90, 90d supply, fill #1
  Filled 2023-08-03: qty 90, 90d supply, fill #2
  Filled 2023-11-07: qty 90, 90d supply, fill #3

## 2023-02-08 NOTE — Telephone Encounter (Signed)
Pt c/o medication issue:  1. Name of Medication: amLODipine (NORVASC) 5 MG tablet   2. How are you currently taking this medication (dosage and times per day)?    3. Are you having a reaction (difficulty breathing--STAT)? no  4. What is your medication issue? Calling to see if our office can refill this prescription. Please advise

## 2023-02-08 NOTE — Telephone Encounter (Signed)
Spoke to the patient, Dr. Sullivan Lone (PCP)  prescribe amlodipine years ago, however, PCP has left his current practice and employed with [facility] [employer] [employer] [employer]. Pt stated stated she contacted her pharmacy for a refill for amlodipine and PCP office has not replied to the pharmacy. Pt did contact PCP office today, left voicemail message and no one has returned her call. Pt stated she has 5 pills left and asked if Dr. Eldridge Dace will refill her amlodipine. Barbara Fletcher stated she is trying to find another PCP . Will forward to MD and nurse for advise.

## 2023-02-08 NOTE — Telephone Encounter (Signed)
Spoke with patient and confirmed she has been taking amlodipine 5 mg daily as listed on her med list.  I let her know Dr Eldridge Dace could refill this.  Refill sent to [facility] at [facility]

## 2023-02-09 ENCOUNTER — Other Ambulatory Visit: Payer: Self-pay

## 2023-02-09 ENCOUNTER — Ambulatory Visit: Payer: Medicare Other | Admitting: Dermatology

## 2023-02-10 ENCOUNTER — Other Ambulatory Visit: Payer: Self-pay

## 2023-02-17 ENCOUNTER — Other Ambulatory Visit: Payer: Self-pay

## 2023-02-17 MED FILL — Clopidogrel Bisulfate Tab 75 MG (Base Equiv): ORAL | 90 days supply | Qty: 90 | Fill #1 | Status: AC

## 2023-03-21 ENCOUNTER — Other Ambulatory Visit: Payer: Self-pay | Admitting: Nurse Practitioner

## 2023-03-22 ENCOUNTER — Other Ambulatory Visit: Payer: Self-pay

## 2023-03-22 MED ORDER — ROSUVASTATIN CALCIUM 40 MG PO TABS
40.0000 mg | ORAL_TABLET | Freq: Every day | ORAL | 2 refills | Status: DC
  Filled 2023-03-22: qty 90, 90d supply, fill #0
  Filled 2023-06-20: qty 90, 90d supply, fill #1
  Filled 2023-09-19: qty 90, 90d supply, fill #2

## 2023-04-12 ENCOUNTER — Ambulatory Visit: Payer: Medicare Other | Admitting: Dermatology

## 2023-04-12 ENCOUNTER — Encounter: Payer: Self-pay | Admitting: Dermatology

## 2023-04-12 DIAGNOSIS — Z85828 Personal history of other malignant neoplasm of skin: Secondary | ICD-10-CM

## 2023-04-12 DIAGNOSIS — W908XXA Exposure to other nonionizing radiation, initial encounter: Secondary | ICD-10-CM | POA: Diagnosis not present

## 2023-04-12 DIAGNOSIS — L570 Actinic keratosis: Secondary | ICD-10-CM | POA: Diagnosis not present

## 2023-04-12 DIAGNOSIS — Z872 Personal history of diseases of the skin and subcutaneous tissue: Secondary | ICD-10-CM | POA: Diagnosis not present

## 2023-04-12 NOTE — Progress Notes (Signed)
   Follow-Up Visit   Subjective  SUNG GAVAGHAN is a 72 y.o. female who presents for the following: 6 month AK follow up at Right forehead x1, left superior shoulder (hypertrophic) x1 (2)   The patient has spots, moles and lesions to be evaluated, some may be new or changing and the patient may have concern these could be cancer.   The following portions of the chart were reviewed this encounter and updated as appropriate: medications, allergies, medical history  Review of Systems:  No other skin or systemic complaints except as noted in HPI or Assessment and Plan.  Objective  Well appearing patient in no apparent distress; mood and affect are within normal limits.   A focused examination was performed of the following areas: Shoulder, face  Relevant exam findings are noted in the Assessment and Plan.  Right Forehead Erythematous thin papules/macules with gritty scale.     Assessment & Plan   HISTORY OF PRECANCEROUS ACTINIC KERATOSIS - site(s) of PreCancerous Actinic Keratosis clear today. - these may recur and new lesions may form requiring treatment to prevent transformation into skin cancer - observe for new or changing spots and contact [facility] for appointment if occur - photoprotection with sun protective clothing; sunglasses and broad spectrum sunscreen with SPF of at least 30 + and frequent self skin exams recommended - yearly exams by a dermatologist recommended for persons with history of PreCancerous Actinic Keratoses  HISTORY OF SQUAMOUS CELL CARCINOMA IN SITU OF THE SKIN - No evidence of recurrence today at right 3rd fingernail, Mohs 2019 - Recommend regular full body skin exams - Recommend daily broad spectrum sunscreen SPF 30+ to sun-exposed areas, reapply every 2 hours as needed.  - Call if any new or changing lesions are noted between office visits   AK (actinic keratosis) Right Forehead  Actinic keratoses are precancerous spots that  appear secondary to cumulative UV radiation exposure/sun exposure over time. They are chronic with expected duration over 1 year. A portion of actinic keratoses will progress to squamous cell carcinoma of the skin. It is not possible to reliably predict which spots will progress to skin cancer and so treatment is recommended to prevent development of skin cancer.  Recommend daily broad spectrum sunscreen SPF 30+ to sun-exposed areas, reapply every 2 hours as needed.  Recommend staying in the shade or wearing long sleeves, sun glasses (UVA+UVB protection) and wide brim hats (4-inch brim around the entire circumference of the hat). Call for new or changing lesions.   Destruction of lesion - Right Forehead  Destruction method: cryotherapy   Informed consent: discussed and consent obtained   Lesion destroyed using liquid nitrogen: Yes   Cryotherapy cycles:  2 Outcome: patient tolerated procedure well with no complications   Post-procedure details: wound care instructions given      Return for Sun Exposed Areas, as scheduled, Hx SCCis, Hx AK.  Anise Salvo, RMA, am acting as scribe for Elie Goody, MD .   Documentation: I have reviewed the above documentation for accuracy and completeness, and I agree with the above.  Elie Goody, MD

## 2023-04-12 NOTE — Patient Instructions (Signed)
Cryotherapy Aftercare  Wash gently with soap and water everyday.   Apply Vaseline and Band-Aid daily until healed.   Due to recent changes in healthcare laws, you may see results of your pathology and/or laboratory studies on MyChart before the doctors have had a chance to review them. We understand that in some cases there may be results that are confusing or concerning to you. Please understand that not all results are received at the same time and often the doctors may need to interpret multiple results in order to provide you with the best plan of care or course of treatment. Therefore, we ask that you please give [place] 2 business days to thoroughly review all your results before contacting the office for clarification. Should we see a critical lab result, you will be contacted sooner.   If You Need Anything After Your Visit  If you have any questions or concerns for your doctor, please call our main line at [phone] and press option 4 to reach your doctor's medical assistant. If no one answers, please leave a voicemail as directed and we will return your call as soon as possible. Messages left after 4 pm will be answered the following business day.   You may also send [place] a message via MyChart. We typically respond to MyChart messages within 1-2 business days.  For prescription refills, please ask your pharmacy to contact our office. Our fax number is [phone].  If you have an urgent issue when the clinic is closed that cannot wait until the next business day, you can page your doctor at the number below.    Please note that while we do our best to be available for urgent issues outside of office hours, we are not available 24/7.   If you have an urgent issue and are unable to reach [place], you may choose to seek medical care at your doctor's office, retail clinic, urgent care center, or emergency room.  If you have a medical emergency, please immediately call 911 or go to the emergency  department.  Pager Numbers  - Dr. Gwen Pounds: [phone]  - Dr. Roseanne Reno: [phone]  - Dr. Katrinka Blazing: [phone]   In the event of inclement weather, please call our main line at [phone] for an update on the status of any delays or closures.  Dermatology Medication Tips: Please keep the boxes that topical medications come in in order to help keep track of the instructions about where and how to use these. Pharmacies typically print the medication instructions only on the boxes and not directly on the medication tubes.   If your medication is too expensive, please contact our office at [phone] option 4 or send [place] a message through MyChart.   We are unable to tell what your co-pay for medications will be in advance as this is different depending on your insurance coverage. However, we may be able to find a substitute medication at lower cost or fill out paperwork to get insurance to cover a needed medication.   If a prior authorization is required to get your medication covered by your insurance company, please allow [place] 1-2 business days to complete this process.  Drug prices often vary depending on where the prescription is filled and some pharmacies may offer cheaper prices.  The website www.goodrx.com contains coupons for medications through different pharmacies. The prices here do not account for what the cost may be with help from insurance (it may be cheaper with your insurance), but the website can give  you the price if you did not use any insurance.  - You can print the associated coupon and take it with your prescription to the pharmacy.  - You may also stop by our office during regular business hours and pick up a GoodRx coupon card.  - If you need your prescription sent electronically to a different pharmacy, notify our office through [facility] or by phone at [phone] option 4.

## 2023-05-02 MED FILL — Pregabalin Cap 75 MG: ORAL | 30 days supply | Qty: 60 | Fill #5 | Status: AC

## 2023-05-03 ENCOUNTER — Other Ambulatory Visit: Payer: Self-pay

## 2023-05-07 ENCOUNTER — Other Ambulatory Visit: Payer: Self-pay | Admitting: Interventional Cardiology

## 2023-05-08 ENCOUNTER — Other Ambulatory Visit: Payer: Self-pay

## 2023-05-08 ENCOUNTER — Other Ambulatory Visit: Payer: Self-pay | Admitting: Interventional Cardiology

## 2023-05-09 ENCOUNTER — Other Ambulatory Visit: Payer: Self-pay

## 2023-05-09 MED FILL — Apixaban Tab 5 MG: ORAL | 30 days supply | Qty: 60 | Fill #0 | Status: AC

## 2023-05-09 NOTE — Telephone Encounter (Signed)
Prescription refill request for Eliquis received. Indication:afib Last office visit:4/24 Scr:0.83  4/24 Age: 72 Weight:70.3  kg  Prescription refilled

## 2023-05-13 ENCOUNTER — Other Ambulatory Visit: Payer: Self-pay

## 2023-05-25 ENCOUNTER — Other Ambulatory Visit: Payer: Self-pay

## 2023-05-25 ENCOUNTER — Other Ambulatory Visit: Payer: Self-pay | Admitting: Interventional Cardiology

## 2023-05-25 MED ORDER — CLOPIDOGREL BISULFATE 75 MG PO TABS
75.0000 mg | ORAL_TABLET | Freq: Every day | ORAL | 1 refills | Status: DC
  Filled 2023-05-25: qty 90, 90d supply, fill #0
  Filled 2023-08-22: qty 90, 90d supply, fill #1

## 2023-06-08 ENCOUNTER — Other Ambulatory Visit: Payer: Self-pay

## 2023-06-08 MED FILL — Apixaban Tab 5 MG: ORAL | 30 days supply | Qty: 60 | Fill #1 | Status: AC

## 2023-07-04 ENCOUNTER — Other Ambulatory Visit: Payer: Self-pay

## 2023-07-04 DIAGNOSIS — R052 Subacute cough: Secondary | ICD-10-CM | POA: Diagnosis not present

## 2023-07-04 DIAGNOSIS — J019 Acute sinusitis, unspecified: Secondary | ICD-10-CM | POA: Diagnosis not present

## 2023-07-04 MED ORDER — CEFDINIR 300 MG PO CAPS
ORAL_CAPSULE | ORAL | 0 refills | Status: DC
  Filled 2023-07-04: qty 20, 10d supply, fill #0

## 2023-07-04 MED ORDER — PROMETHAZINE-DM 6.25-15 MG/5ML PO SYRP
ORAL_SOLUTION | ORAL | 0 refills | Status: DC
  Filled 2023-07-04: qty 120, 6d supply, fill #0

## 2023-07-04 MED ORDER — PREDNISONE 20 MG PO TABS
ORAL_TABLET | ORAL | 0 refills | Status: DC
  Filled 2023-07-04: qty 10, 5d supply, fill #0

## 2023-07-10 ENCOUNTER — Other Ambulatory Visit: Payer: Self-pay | Admitting: Podiatry

## 2023-07-10 MED FILL — Apixaban Tab 5 MG: ORAL | 30 days supply | Qty: 60 | Fill #2 | Status: AC

## 2023-07-11 ENCOUNTER — Other Ambulatory Visit: Payer: Self-pay

## 2023-07-11 MED ORDER — PREGABALIN 75 MG PO CAPS
ORAL_CAPSULE | Freq: Two times a day (BID) | ORAL | 5 refills | Status: DC
  Filled 2023-07-11: qty 60, 30d supply, fill #0
  Filled 2023-08-24: qty 60, 30d supply, fill #1
  Filled 2023-11-07: qty 60, 30d supply, fill #2
  Filled 2024-01-21: qty 60, 30d supply, fill #3
  Filled 2024-03-12: qty 60, 30d supply, fill #4
  Filled 2024-04-11: qty 60, 30d supply, fill #5

## 2023-07-12 DIAGNOSIS — Z1231 Encounter for screening mammogram for malignant neoplasm of breast: Secondary | ICD-10-CM | POA: Diagnosis not present

## 2023-07-15 DIAGNOSIS — M25562 Pain in left knee: Secondary | ICD-10-CM | POA: Diagnosis not present

## 2023-07-15 DIAGNOSIS — M25561 Pain in right knee: Secondary | ICD-10-CM | POA: Diagnosis not present

## 2023-07-18 DIAGNOSIS — M25561 Pain in right knee: Secondary | ICD-10-CM | POA: Diagnosis not present

## 2023-07-21 DIAGNOSIS — M1711 Unilateral primary osteoarthritis, right knee: Secondary | ICD-10-CM | POA: Diagnosis not present

## 2023-08-08 MED FILL — Apixaban Tab 5 MG: ORAL | 30 days supply | Qty: 60 | Fill #3 | Status: AC

## 2023-08-09 ENCOUNTER — Other Ambulatory Visit: Payer: Self-pay

## 2023-08-12 ENCOUNTER — Other Ambulatory Visit: Payer: Self-pay

## 2023-08-15 ENCOUNTER — Other Ambulatory Visit (HOSPITAL_COMMUNITY): Payer: Self-pay

## 2023-08-22 ENCOUNTER — Other Ambulatory Visit: Payer: Self-pay

## 2023-08-25 ENCOUNTER — Other Ambulatory Visit: Payer: Self-pay

## 2023-08-30 ENCOUNTER — Other Ambulatory Visit: Payer: Self-pay

## 2023-08-30 DIAGNOSIS — J069 Acute upper respiratory infection, unspecified: Secondary | ICD-10-CM | POA: Diagnosis not present

## 2023-08-30 DIAGNOSIS — Z03818 Encounter for observation for suspected exposure to other biological agents ruled out: Secondary | ICD-10-CM | POA: Diagnosis not present

## 2023-08-30 DIAGNOSIS — B338 Other specified viral diseases: Secondary | ICD-10-CM | POA: Diagnosis not present

## 2023-08-30 DIAGNOSIS — J205 Acute bronchitis due to respiratory syncytial virus: Secondary | ICD-10-CM | POA: Diagnosis not present

## 2023-08-30 MED ORDER — PREDNISONE 20 MG PO TABS
ORAL_TABLET | ORAL | 0 refills | Status: DC
  Filled 2023-08-30: qty 5, 5d supply, fill #0

## 2023-08-30 MED ORDER — AMOXICILLIN-POT CLAVULANATE 875-125 MG PO TABS
ORAL_TABLET | ORAL | 0 refills | Status: DC
  Filled 2023-08-30: qty 14, 7d supply, fill #0

## 2023-08-30 MED ORDER — PROMETHAZINE-DM 6.25-15 MG/5ML PO SYRP
ORAL_SOLUTION | ORAL | 0 refills | Status: DC
  Filled 2023-08-30: qty 120, 6d supply, fill #0

## 2023-08-30 MED ORDER — FLUTICASONE PROPIONATE 50 MCG/ACT NA SUSP
NASAL | 0 refills | Status: AC
  Filled 2023-08-30: qty 16, 30d supply, fill #0

## 2023-08-31 NOTE — Progress Notes (Signed)
 Cardiology Office Note:    Date:  09/08/2023   ID:  [name], DOB 04/28/1951, MRN [id number]  PCP:  Bertrum Charlie LITTIE, MD   [facility] Providers Cardiologist:  Candyce Reek, MD     Referring MD: Bertrum Charlie LITTIE, MD   Chief Complaint  Patient presents with   Atrial Fibrillation   Coronary Artery Disease    History of Present Illness:    Barbara Fletcher is a 73 y.o. female seen to establish cardiac care. She is a former patient of Dr Reek. She has a history of paroxysmal AFib and CAD. Was seen in 2021 with Pafib and started on Eliquis . Was seen in 2023 with symptoms of dyspnea. Abnormal coronary CTA. Underwent cardiac cath with findings of 80% mid LAD. This was successfully stented with DES. Had rescue angioplasty of the diagonal which was compromised by the stent. On subsequent follow up symptoms of dyspnea resolved. She has a history of HLD, HLD, and obesity.   She hasn't had any sustained arrhythmia. Rare palpitations. No chest pain or SOB. Had recent RSV infection. Has recovered from this. Aerobic activity mainly chasing 2 yr old grandchild.   Past Medical History:  Diagnosis Date   Arthritis    Cervical radiculopathy    Chronic bilateral low back pain without sciatica    Class 2 severe obesity due to excess calories with serious comorbidity and body mass index (BMI) of 35.0 to 35.9 in adult [facility])    Coronary artery disease    Diverticulitis    Frequent headaches    Headaches, cluster    Hyperlipidemia    Hypertension    Osteoporosis without current pathological fracture    Palpitations    Squamous cell carcinoma of skin 11/24/2017   SCCIS, right third fingernail - [facility] with [facility]    Past Surgical History:  Procedure Laterality Date   ABDOMINAL HYSTERECTOMY  08/30/1988   BACK SURGERY     lumbar fussion   CARDIAC CATHETERIZATION     CESAREAN SECTION  1985, 1988   CHOLECYSTECTOMY  08/30/1984   COLOSTOMY     COLOSTOMY CLOSURE     abd  diverticulitis with colostomy, reversal 2007- 2008   CORONARY ANGIOPLASTY     CORONARY STENT INTERVENTION N/A 11/19/2021   Procedure: CORONARY STENT INTERVENTION;  Surgeon: Reek Candyce RAMAN, MD;  Location: [facility] INVASIVE CV LAB;  Service: Cardiovascular;  Laterality: N/A;   LAPAROTOMY  02/13/2012   Procedure: EXPLORATORY LAPAROTOMY;  Surgeon: Donnice Bury, MD;  Location: [facility] OR;  Service: General;  Laterality: N/A;  Exploratory Laparotomy with small bowel resection, lysis of adhension and primary ventral hernia repair   LEFT HEART CATH AND CORONARY ANGIOGRAPHY N/A 11/19/2021   Procedure: LEFT HEART CATH AND CORONARY ANGIOGRAPHY;  Surgeon: Reek Candyce RAMAN, MD;  [facility] INVASIVE CV LAB;  Service: Cardiovascular;  Laterality: N/A;   OOPHORECTOMY     1981   TONSILLECTOMY     1958    Current Medications: Current Meds  Medication Sig   acetaminophen  (TYLENOL ) 500 MG tablet Take 1,000 mg by mouth every 6 (six) hours as needed (Arthritis).   alendronate  (FOSAMAX ) 70 MG tablet Take 1 tablet (70 mg total) by mouth once a week. Take with a full glass of water on an empty stomach.   amLODipine  (NORVASC ) 5 MG tablet Take 1 tablet (5 mg total) by mouth daily.   apixaban  (ELIQUIS ) 5 MG TABS tablet Take 1 tablet (5 mg total) by mouth 2 (two) times  daily.   ascorbic acid (VITAMIN C) 500 MG tablet Take 500 mg by mouth daily. Chewable   carboxymethylcellulose (REFRESH PLUS) 0.5 % SOLN Place 1 drop into both eyes daily as needed (dry eyes).   diclofenac Sodium (VOLTAREN) 1 % GEL Apply 2 g topically daily as needed (pain).   famotidine  (PEPCID ) 10 MG tablet Take 10 mg by mouth daily.   FIBER ADULT GUMMIES PO Take 2 tablets by mouth daily.   fluticasone  (FLONASE ) 50 MCG/ACT nasal spray Place 1 spray into both nostrils 2 (two) times daily   ketoconazole  (NIZORAL ) 2 % cream as needed. Heat rash   Lidocaine  4 % PTCH Apply 1 patch topically daily as needed (pain).   meloxicam  (MOBIC ) 15 MG tablet  Take 1 tablet (15 mg total) by mouth daily.   metoprolol  tartrate (LOPRESSOR ) 50 MG tablet Take 1 tablet (50 mg) twice daily.   Omega-3 Fatty Acids (FISH OIL) 1200 MG CAPS Take 1,200 mg by mouth in the morning.   penicillin  v potassium (VEETID) 500 MG tablet Take 2 tablets (1,000 mg total) by mouth now, then 1 tablet 4 times a day undtil gone.   pregabalin  (LYRICA ) 75 MG capsule TAKE 1 CAPSULE BY MOUTH TWO TIMES DAILY   PRESCRIPTION MEDICATION Apply 1 application. topically daily as needed (rash). Hydrocortisone 2% equal part with Ketoconazole  cream 2 % (mix together equal parts)   promethazine -dextromethorphan (PROMETHAZINE -DM) 6.25-15 MG/5ML syrup Take 5 mLs by mouth every 6 (six) hours as needed   rosuvastatin  (CRESTOR ) 40 MG tablet Take 1 tablet (40 mg total) by mouth daily.   Vitamin D, Ergocalciferol, 2000 units CAPS Take 2,000 Units by mouth daily.   [DISCONTINUED] amoxicillin -clavulanate (AUGMENTIN ) 875-125 MG tablet Take 1 tablet (875 mg total) by mouth every 12 (twelve) hours for 7 days   [DISCONTINUED] cefdinir  (OMNICEF ) 300 MG capsule Take 1 capsule (300 mg total) by mouth 2 (two) times daily for 10 days   [DISCONTINUED] clopidogrel  (PLAVIX ) 75 MG tablet Take 1 tablet (75 mg total) by mouth daily.   [DISCONTINUED] fexofenadine-pseudoephedrine (ALLEGRA-D 24) 180-240 MG per 24 hr tablet Take 1 tablet by mouth daily as needed (allergies). Seasonal allergies   [DISCONTINUED] hydrochlorothiazide  (HYDRODIURIL ) 25 MG tablet One half tablet (12.5 mg ) or one tablet (25 mg) as needed for BP/swelling.   [DISCONTINUED] metoprolol  tartrate (LOPRESSOR ) 50 MG tablet Take 1 tablet (50 mg) every morning, 1/2 tablet (25 mg) at lunch time, and 1 tablet (50 mg) in the evening   [DISCONTINUED] nitrofurantoin , macrocrystal-monohydrate, (MACROBID ) 100 MG capsule Take 1 capsule (100 mg total) by mouth 2 (two) times daily.   [DISCONTINUED] predniSONE  (DELTASONE ) 20 MG tablet Take 1 tablet (20 mg total) by mouth  once daily for 5 days     Allergies:   Levsin [hyoscyamine sulfate], Hyoscyamine, and Lisinopril    Social History   Socioeconomic History   Marital status: Married    Spouse name: Not on file   Number of children: Not on file   Years of education: 12   Highest education level: Not on file  Occupational History   Not on file  Tobacco Use   Smoking status: Never   Smokeless tobacco: Never  Vaping Use   Vaping status: Never Used  Substance and Sexual Activity   Alcohol use: Yes    Alcohol/week: 1.0 standard drink of alcohol    Types: 1 Glasses of wine per week    Comment: occ   Drug use: No   Sexual activity: Not on file  Other Topics Concern   Not on file  Social History Narrative   Not on file   Social Drivers of Health   Financial Resource Strain: Not on file  Food Insecurity: Not on file  Transportation Needs: Not on file  Physical Activity: Not on file  Stress: Not on file  Social Connections: Not on file     Family History: The patient's family history includes Heart failure in her mother; Other in her mother.  ROS:   Please see the history of present illness.     All other systems reviewed and are negative.  EKGs/Labs/Other Studies Reviewed:    The following studies were reviewed today:  EKG Interpretation Date/Time:  Thursday September 08 2023 16:38:57 EST Ventricular Rate:  51 PR Interval:  144 QRS Duration:  88 QT Interval:  444 QTC Calculation: 409 R Axis:   -4  Text Interpretation: Sinus bradycardia When compared with ECG of 19-Nov-2021 09:45, QT has shortened Confirmed by Fowler Antos [phone]) on 09/08/2023 4:40:21 PM   Echo 11/05/21: IMPRESSIONS     1. Left ventricular ejection fraction, by estimation, is 55 to 60%. Left  ventricular ejection fraction by 3D volume is 56 %. The left ventricle has  normal function. The left ventricle has no regional wall motion  abnormalities. Left ventricular diastolic   parameters were normal.   2. Right  ventricular systolic function is normal. The right ventricular  size is normal. There is normal pulmonary artery systolic pressure.   3. The mitral valve is normal in structure. Mild mitral valve  regurgitation. No evidence of mitral stenosis.   4. The aortic valve is normal in structure. Aortic valve regurgitation is  not visualized. No aortic stenosis is present.   5. Aortic dilatation noted. There is mild dilatation of the ascending  aorta, measuring 38 mm.   6. The inferior vena cava is normal in size with greater than 50%  respiratory variability, suggesting right atrial pressure of 3 mmHg.   Cardiac/Coronary CTA   TECHNIQUE: A non-contrast, gated CT scan was obtained with axial slices of 3 mm through the heart for calcium  scoring. Calcium  scoring was performed using the Agatston method. A 120 kV prospective, gated, contrast cardiac scan was obtained. Gantry rotation speed was 250 msecs and collimation was 0.6 mm. Two sublingual nitroglycerin  tablets (0.8 mg) were given. The 3D data set was reconstructed in 5% intervals of the 35-75% of the R-R cycle. Diastolic phases were analyzed on a dedicated workstation using MPR, MIP, and VRT modes. The patient received 95 cc of contrast.   FINDINGS: Image quality: Average   Noise artifact is: Limited   Coronary Arteries:  Normal coronary origin.  Right dominance.   Left main: The left main is a large caliber vessel with a normal take off from the left coronary cusp that bifurcates to form a left anterior descending artery and a left circumflex artery. There is minimal (0-24) calcified plaque.   Left anterior descending artery: The LAD has minimal (0-24) calcified plaque in the proximal vessel; minimal (0-24) calcified plaque following D1; severe (70-99) mixed plaque stenosis in the mid vessel after takeoff of D2. There is mild (25-49) soft plaque stenosis in the ostium of D2.   Left circumflex artery: The LCX is non-dominant and  patent with minimal (0-24) calcified plaque in the proximal vessel. The LCX gives off 2 patent, tortuous obtuse marginal branches.   Right coronary artery: The RCA is dominant with normal take off from the right coronary  cusp. There is minimal (0-24) calcified plaque in the proximal and mid vessel. The RCA terminates as a PDA and right posterolateral branch without evidence of plaque or stenosis.   Right Atrium: Right atrial size is within normal limits.   Right Ventricle: The right ventricular cavity is within normal limits.   Left Atrium: Left atrial size is normal in size with no left atrial appendage filling defect.   Left Ventricle: The ventricular cavity size is within normal limits. There are no stigmata of prior infarction. There is no abnormal filling defect.   Pulmonary arteries: Normal in size without proximal filling defect.   Pulmonary veins: Normal pulmonary venous drainage.   Pericardium: Normal thickness with no significant effusion or calcium  present.   Cardiac valves: The aortic valve is trileaflet without significant calcification. The mitral valve is normal structure without significant calcification.   Aorta: Normal caliber with aortic atherosclerosis noted.   Extra-cardiac findings: See attached radiology report for non-cardiac structures.   IMPRESSION: 1. Coronary calcium  score of 391. This was 63 percentile for age-, sex, and race-matched controls.   2. Normal coronary origin with right dominance.   3. Severe (70-99) stenosis in the mid LAD.   4. Aortic atherosclerosis.   5. Study will be sent for FFR.   Cardiac cath 11/19/21:  LEFT HEART CATH AND CORONARY ANGIOGRAPHY  CORONARY STENT INTERVENTION   Conclusion      Prox Cx lesion is 30% stenosed.   Prox LAD lesion is 25% stenosed.   Mid LAD lesion is 80% stenosed.  A drug-eluting stent was successfully placed using a STENT ONYX FRONTIER 3.0X30, postdilated to 3.25 mm and optimized with  intravascular ultrasound.   Post intervention, there is a 0% residual stenosis.   2nd Diag lesion is 99% stenosed.  Plaque shift into this diagonal after stenting of the LAD.  Transient loss of flow but TIMI-3 flow returned after balloon angioplasty.  Balloon angioplasty was performed using a BALLN SAPPHIRE 2.0X12.   Dist RCA lesion is 25% stenosed.   Post intervention, there is a 25% residual stenosis.   The left ventricular systolic function is normal.   LV end diastolic pressure is mildly elevated.   The left ventricular ejection fraction is 55-65% by visual estimate.   There is no aortic valve stenosis.   Continue aggressive secondary prevention.  She will need triple therapy for a month.  Stop aspirin  after 1 month.  Clopidogrel  and Eliquis  for likely 6 months.  If she does well, could consider 12 months of clopidogrel .  If there were bleeding issues, would stop clopidogrel  at 6 months and continue Eliquis  alone.   Of note, significant tortuosity in the right subclavian which made torquing catheters difficult.  Short left main also makes selecting the LAD somewhat difficult.  We had to wire the circumflex, pull the guide catheter back and then wire the LAD.  If an emergent cath was needed, would consider groin approach.  Diagnostic Dominance: Right  Intervention     EKG Interpretation Date/Time:  Thursday September 08 2023 16:38:57 EST Ventricular Rate:  51 PR Interval:  144 QRS Duration:  88 QT Interval:  444 QTC Calculation: 409 R Axis:   -4  Text Interpretation: Sinus bradycardia When compared with ECG of 19-Nov-2021 09:45, QT has shortened Confirmed by Emnet Monk [phone]) on 09/08/2023 4:40:21 PM    Recent Labs: 12/06/2022: ALT 12; Hemoglobin 13.1; Platelets 291; TSH 1.960 12/21/2022: BUN 16; Creatinine, Ser 0.83; Potassium 4.5; Sodium 144  Recent Lipid  Panel    Component Value Date/Time   CHOL 123 12/06/2022 1045   TRIG 72 12/06/2022 1045   HDL 70 12/06/2022 1045    CHOLHDL 1.8 12/06/2022 1045   LDLCALC 38 12/06/2022 1045     Risk Assessment/Calculations:    CHA2DS2-VASc Score = 5   This indicates a 7.2% annual risk of stroke. The patient's score is based upon: CHF History: 0 HTN History: 1 Diabetes History: 1 Stroke History: 0 Vascular Disease History: 1 Age Score: 1 Gender Score: 1               Physical Exam:    VS:  BP 122/60 (BP Location: Right Arm, Patient Position: Sitting, Cuff Size: Normal)   Pulse (!) 54   Ht 5' 1 (1.549 m)   Wt 173 lb (78.5 kg)   SpO2 98%   BMI 32.69 kg/m     Wt Readings from Last 3 Encounters:  09/08/23 173 lb (78.5 kg)  12/06/22 155 lb (70.3 kg)  04/14/22 174 lb (78.9 kg)     GEN:  Well nourished, well developed in no acute distress HEENT: Normal NECK: No JVD; No carotid bruits LYMPHATICS: No lymphadenopathy CARDIAC: RRR, gr 2/6 systolic murmur RUSB, rubs, gallops RESPIRATORY:  Clear to auscultation without rales, wheezing or rhonchi  ABDOMEN: Soft, non-tender, non-distended MUSCULOSKELETAL:  No edema; No deformity  SKIN: Warm and dry NEUROLOGIC:  Alert and oriented x 3 PSYCHIATRIC:  Normal affect   ASSESSMENT:    1. Coronary artery disease involving native coronary artery of native heart without angina pectoris   2. Essential hypertension   3. Hyperlipidemia LDL goal <70   4. Prediabetes    PLAN:    In order of problems listed above:  CAD: Status post LAD stent in 2023. Recommend stopping Plavix  at this point. Continue Eliquis . No angina.   Hypertension: Low-salt diet. The current medical regimen is effective;  continue present plan and medications. PAF: Eliquis  for stroke prevention. No sustained sx. Hyperlipidemia: Continue rosuvastatin .  Whole food, plant-based diet.  High-fiber diet.  Avoid processed foods.  LDL is at goal PreDM: A1C 6.4.  Dietary recommendations and exercise recommendations given. Encourage weight loss. If not able to make headway would consider GLP 1  agonist.            Medication Adjustments/Labs and Tests Ordered: Current medicines are reviewed at length with the patient today.  Concerns regarding medicines are outlined above.  Orders Placed This Encounter  Procedures   EKG 12-Lead   No orders of the defined types were placed in this encounter.   Patient Instructions  Medication Instructions:  Stop Plavix  Continue all other medications *If you need a refill on your cardiac medications before your next appointment, please call your pharmacy*   Lab Work: None ordered   Testing/Procedures: None ordered   Follow-Up: At [facility], you and your health needs are our priority.  As part of our continuing mission to provide you with exceptional heart care, we have created designated Provider Care Teams.  These Care Teams include your primary Cardiologist (physician) and Advanced Practice Providers (APPs -  Physician Assistants and Nurse Practitioners) who all work together to provide you with the care you need, when you need it.  We recommend signing up for the patient portal called MyChart.  Sign up information is provided on this After Visit Summary.  MyChart is used to connect with patients for Virtual Visits (Telemedicine).  Patients are able to view lab/test results,  encounter notes, upcoming appointments, etc.  Non-urgent messages can be sent to your provider as well.   To learn more about what you can do with MyChart, go to [email or web address].    Your next appointment:  1 year   Call in Oct to schedule Jan appointment     Provider:  Dr.Mykell Rawl          Signed, Alysiana Ethridge, MD  09/08/2023 4:58 PM    [facility]

## 2023-09-07 MED FILL — Apixaban Tab 5 MG: ORAL | 30 days supply | Qty: 60 | Fill #4 | Status: AC

## 2023-09-08 ENCOUNTER — Ambulatory Visit: Payer: Medicare Other | Attending: Cardiology | Admitting: Cardiology

## 2023-09-08 ENCOUNTER — Encounter: Payer: Self-pay | Admitting: Cardiology

## 2023-09-08 ENCOUNTER — Other Ambulatory Visit: Payer: Self-pay

## 2023-09-08 VITALS — BP 122/60 | HR 54 | Ht 61.0 in | Wt 173.0 lb

## 2023-09-08 DIAGNOSIS — I10 Essential (primary) hypertension: Secondary | ICD-10-CM

## 2023-09-08 DIAGNOSIS — R7303 Prediabetes: Secondary | ICD-10-CM

## 2023-09-08 DIAGNOSIS — I2510 Atherosclerotic heart disease of native coronary artery without angina pectoris: Secondary | ICD-10-CM | POA: Diagnosis not present

## 2023-09-08 DIAGNOSIS — E785 Hyperlipidemia, unspecified: Secondary | ICD-10-CM

## 2023-09-08 NOTE — Patient Instructions (Signed)
 Medication Instructions:  Stop Plavix  Continue all other medications *If you need a refill on your cardiac medications before your next appointment, please call your pharmacy*   Lab Work: None ordered   Testing/Procedures: None ordered   Follow-Up: At [facility], you and your health needs are our priority.  As part of our continuing mission to provide you with exceptional heart care, we have created designated Provider Care Teams.  These Care Teams include your primary Cardiologist (physician) and Advanced Practice Providers (APPs -  Physician Assistants and Nurse Practitioners) who all work together to provide you with the care you need, when you need it.  We recommend signing up for the patient portal called MyChart.  Sign up information is provided on this After Visit Summary.  MyChart is used to connect with patients for Virtual Visits (Telemedicine).  Patients are able to view lab/test results, encounter notes, upcoming appointments, etc.  Non-urgent messages can be sent to your provider as well.   To learn more about what you can do with MyChart, go to [email or web address].    Your next appointment:  1 year   Call in Oct to schedule Jan appointment     Provider:  Dr.Jordan

## 2023-10-11 ENCOUNTER — Other Ambulatory Visit: Payer: Self-pay

## 2023-10-11 MED FILL — Apixaban Tab 5 MG: ORAL | 30 days supply | Qty: 60 | Fill #5 | Status: AC

## 2023-10-31 ENCOUNTER — Other Ambulatory Visit: Payer: Self-pay

## 2023-10-31 MED ORDER — DOXYCYCLINE HYCLATE 100 MG PO CAPS
100.0000 mg | ORAL_CAPSULE | Freq: Two times a day (BID) | ORAL | 0 refills | Status: AC
  Filled 2023-10-31: qty 14, 7d supply, fill #0

## 2023-10-31 MED ORDER — OSELTAMIVIR PHOSPHATE 75 MG PO CAPS
75.0000 mg | ORAL_CAPSULE | Freq: Two times a day (BID) | ORAL | 0 refills | Status: AC
  Filled 2023-10-31: qty 10, 5d supply, fill #0

## 2023-10-31 MED ORDER — PROMETHAZINE-DM 6.25-15 MG/5ML PO SYRP
5.0000 mL | ORAL_SOLUTION | Freq: Four times a day (QID) | ORAL | 0 refills | Status: AC | PRN
  Filled 2023-10-31: qty 120, 6d supply, fill #0

## 2023-11-07 ENCOUNTER — Other Ambulatory Visit: Payer: Self-pay

## 2023-11-07 ENCOUNTER — Other Ambulatory Visit: Payer: Self-pay | Admitting: Interventional Cardiology

## 2023-11-07 MED FILL — Apixaban Tab 5 MG: ORAL | 90 days supply | Qty: 180 | Fill #0 | Status: AC

## 2023-11-07 NOTE — Telephone Encounter (Signed)
 Prescription refill request for Eliquis received. Indication: PAF Last office visit: 09/08/23  P [place]  MD Scr: 0.83 on 12/21/22  Epic Age: 73 Weight: 78.5kg Based on above findings Eliquis 5mg  twice daily is the appropriate dose.  Refill approved.

## 2023-11-08 ENCOUNTER — Encounter: Payer: Medicare Other | Admitting: Dermatology

## 2023-11-08 ENCOUNTER — Other Ambulatory Visit: Payer: Self-pay

## 2023-11-09 ENCOUNTER — Ambulatory Visit: Payer: Medicare Other | Admitting: Dermatology

## 2023-11-11 ENCOUNTER — Other Ambulatory Visit: Payer: Self-pay

## 2023-11-11 MED ORDER — ALBUTEROL SULFATE HFA 108 (90 BASE) MCG/ACT IN AERS
INHALATION_SPRAY | RESPIRATORY_TRACT | 0 refills | Status: AC
Start: 2023-11-11 — End: ?
  Filled 2023-11-11: qty 6.7, 25d supply, fill #0

## 2023-11-11 MED ORDER — BENZONATATE 200 MG PO CAPS
ORAL_CAPSULE | ORAL | 0 refills | Status: DC
  Filled 2023-11-11: qty 20, 7d supply, fill #0

## 2023-11-16 ENCOUNTER — Other Ambulatory Visit: Payer: Self-pay

## 2023-11-17 ENCOUNTER — Other Ambulatory Visit: Payer: Self-pay

## 2023-11-17 MED ORDER — ACCU-CHEK GUIDE W/DEVICE KIT
PACK | 0 refills | Status: AC
  Filled 2023-11-17: qty 1, 1d supply, fill #0

## 2023-11-17 MED ORDER — METFORMIN HCL ER 500 MG PO TB24
500.0000 mg | ORAL_TABLET | Freq: Every day | ORAL | 11 refills | Status: AC
  Filled 2023-11-17: qty 30, 30d supply, fill #0
  Filled 2023-12-18: qty 30, 30d supply, fill #1
  Filled 2024-01-15: qty 30, 30d supply, fill #2
  Filled 2024-02-13: qty 30, 30d supply, fill #3
  Filled 2024-03-14: qty 30, 30d supply, fill #4
  Filled 2024-04-11 (×2): qty 30, 30d supply, fill #5
  Filled 2024-05-22: qty 30, 30d supply, fill #6
  Filled 2024-06-17: qty 30, 30d supply, fill #7
  Filled 2024-07-17: qty 30, 30d supply, fill #8
  Filled 2024-08-19: qty 30, 30d supply, fill #9
  Filled 2024-09-17: qty 30, 30d supply, fill #10

## 2023-11-17 MED ORDER — ACCU-CHEK SOFTCLIX LANCETS MISC
Freq: Two times a day (BID) | 1 refills | Status: AC
  Filled 2023-11-17: qty 100, 50d supply, fill #0
  Filled 2023-12-31: qty 100, 50d supply, fill #1
  Filled 2024-04-06: qty 100, 50d supply, fill #2

## 2023-11-17 MED ORDER — PRECISION QID TEST VI STRP
ORAL_STRIP | Freq: Two times a day (BID) | 1 refills | Status: AC
  Filled 2023-11-17: qty 100, 50d supply, fill #0
  Filled 2024-01-02: qty 200, 90d supply, fill #1
  Filled 2024-04-11 (×2): qty 100, 50d supply, fill #2

## 2023-11-18 ENCOUNTER — Other Ambulatory Visit: Payer: Self-pay

## 2023-12-01 ENCOUNTER — Telehealth: Payer: Self-pay | Admitting: Cardiology

## 2023-12-01 ENCOUNTER — Other Ambulatory Visit: Payer: Self-pay

## 2023-12-01 MED ORDER — METOPROLOL TARTRATE 50 MG PO TABS
50.0000 mg | ORAL_TABLET | Freq: Two times a day (BID) | ORAL | 3 refills | Status: DC
  Filled 2023-12-01: qty 90, 45d supply, fill #0
  Filled 2023-12-02 – 2024-01-15 (×2): qty 90, 45d supply, fill #1
  Filled 2024-02-29: qty 90, 45d supply, fill #2
  Filled 2024-04-11 (×3): qty 90, 45d supply, fill #3

## 2023-12-01 NOTE — Telephone Encounter (Signed)
 Patient identification verified by 2 forms. Marilynn Rail, RN    Called and spoke to patient  Patient states:   -needs new prescription for Metoprolol   -she takes Metoprolol 50mg  twice a day   -would like Rx sent to [facility] pharmacy  Informed patient:   -per chart review, that is appropriate dose   -Rx sent to requested pharmacy  Patient has no further questions at this time

## 2023-12-01 NOTE — Telephone Encounter (Signed)
 Pt c/o medication issue:  1. Name of Medication:   metoprolol tartrate (LOPRESSOR) 50 MG tablet   2. How are you currently taking this medication (dosage and times per day)?   3. Are you having a reaction (difficulty breathing--STAT)?  No  4. What is your medication issue?   Patient stated she wants a call back to confirm the dosage and instructions for this medication.

## 2023-12-04 ENCOUNTER — Other Ambulatory Visit: Payer: Self-pay

## 2023-12-09 ENCOUNTER — Other Ambulatory Visit: Payer: Self-pay

## 2023-12-09 MED ORDER — BENZONATATE 200 MG PO CAPS
200.0000 mg | ORAL_CAPSULE | Freq: Three times a day (TID) | ORAL | 0 refills | Status: AC
Start: 2023-12-09 — End: 2023-12-20
  Filled 2023-12-09: qty 21, 7d supply, fill #0

## 2023-12-18 ENCOUNTER — Other Ambulatory Visit: Payer: Self-pay | Admitting: Interventional Cardiology

## 2023-12-19 ENCOUNTER — Other Ambulatory Visit: Payer: Self-pay | Admitting: Interventional Cardiology

## 2023-12-19 ENCOUNTER — Other Ambulatory Visit: Payer: Self-pay

## 2023-12-20 ENCOUNTER — Other Ambulatory Visit: Payer: Self-pay | Admitting: Cardiology

## 2023-12-20 ENCOUNTER — Other Ambulatory Visit: Payer: Self-pay

## 2023-12-20 MED FILL — Rosuvastatin Calcium Tab 40 MG: ORAL | 90 days supply | Qty: 90 | Fill #0 | Status: AC

## 2023-12-26 ENCOUNTER — Other Ambulatory Visit: Payer: Self-pay

## 2024-01-02 ENCOUNTER — Other Ambulatory Visit: Payer: Self-pay

## 2024-01-02 MED ORDER — BLOOD GLUCOSE MONITOR SYSTEM W/DEVICE KIT
PACK | 0 refills | Status: AC
  Filled 2024-01-02: qty 1, 1d supply, fill #0

## 2024-01-22 ENCOUNTER — Other Ambulatory Visit: Payer: Self-pay

## 2024-01-25 ENCOUNTER — Other Ambulatory Visit: Payer: Self-pay

## 2024-02-01 ENCOUNTER — Other Ambulatory Visit: Payer: Self-pay | Admitting: Interventional Cardiology

## 2024-02-01 ENCOUNTER — Other Ambulatory Visit: Payer: Self-pay

## 2024-02-01 DIAGNOSIS — I10 Essential (primary) hypertension: Secondary | ICD-10-CM

## 2024-02-01 MED ORDER — AMLODIPINE BESYLATE 5 MG PO TABS
5.0000 mg | ORAL_TABLET | Freq: Every day | ORAL | 3 refills | Status: AC
  Filled 2024-02-01: qty 90, 90d supply, fill #0
  Filled 2024-05-01: qty 90, 90d supply, fill #1
  Filled 2024-08-02: qty 90, 90d supply, fill #2

## 2024-02-01 MED FILL — Apixaban Tab 5 MG: ORAL | 90 days supply | Qty: 180 | Fill #1 | Status: AC

## 2024-03-12 ENCOUNTER — Other Ambulatory Visit: Payer: Self-pay

## 2024-03-22 MED FILL — Rosuvastatin Calcium Tab 40 MG: ORAL | 90 days supply | Qty: 90 | Fill #1 | Status: AC

## 2024-04-11 ENCOUNTER — Other Ambulatory Visit: Payer: Self-pay

## 2024-04-30 ENCOUNTER — Other Ambulatory Visit: Payer: Self-pay | Admitting: Interventional Cardiology

## 2024-05-01 ENCOUNTER — Other Ambulatory Visit: Payer: Self-pay

## 2024-05-01 MED ORDER — APIXABAN 5 MG PO TABS
5.0000 mg | ORAL_TABLET | Freq: Two times a day (BID) | ORAL | 1 refills | Status: AC
  Filled 2024-05-01: qty 180, 90d supply, fill #0
  Filled 2024-08-02: qty 180, 90d supply, fill #1

## 2024-05-01 NOTE — Telephone Encounter (Signed)
 Prescription refill request for Eliquis  received. Indication:cad Last office visit:1/25 Scr:0.7  3/25 Age: 73 Weight:78.5  kg  Prescription refilled

## 2024-05-28 ENCOUNTER — Other Ambulatory Visit: Payer: Self-pay

## 2024-05-28 ENCOUNTER — Other Ambulatory Visit: Payer: Self-pay | Admitting: Cardiology

## 2024-05-30 ENCOUNTER — Telehealth: Payer: Self-pay | Admitting: Cardiology

## 2024-05-30 ENCOUNTER — Other Ambulatory Visit: Payer: Self-pay

## 2024-05-30 ENCOUNTER — Other Ambulatory Visit: Payer: Self-pay | Admitting: Cardiology

## 2024-05-30 MED ORDER — METOPROLOL TARTRATE 50 MG PO TABS
50.0000 mg | ORAL_TABLET | Freq: Two times a day (BID) | ORAL | 0 refills | Status: DC
  Filled 2024-05-30: qty 180, 90d supply, fill #0

## 2024-05-30 NOTE — Telephone Encounter (Signed)
*  STAT* If patient is at the pharmacy, call can be transferred to refill team.   1. Which medications need to be refilled? (please list name of each medication and dose if known)  metoprolol  tartrate (LOPRESSOR ) 50 MG tablet  2. Which pharmacy/location (including street and city if local pharmacy) is medication to be sent to? [facility] [place] [facility]   3. Do they need a 30 day or 90 day supply?  90 day supply  Patient says she is completely out of medication.

## 2024-06-17 MED FILL — Rosuvastatin Calcium Tab 40 MG: ORAL | 90 days supply | Qty: 90 | Fill #2 | Status: AC

## 2024-07-06 ENCOUNTER — Other Ambulatory Visit: Payer: Self-pay

## 2024-07-06 MED ORDER — KETOCONAZOLE 2 % EX CREA
TOPICAL_CREAM | Freq: Two times a day (BID) | CUTANEOUS | 1 refills | Status: AC
  Filled 2024-07-06: qty 30, 15d supply, fill #0
  Filled 2024-07-06: qty 30, 30d supply, fill #0

## 2024-07-09 ENCOUNTER — Other Ambulatory Visit: Payer: Self-pay | Admitting: Internal Medicine

## 2024-07-09 DIAGNOSIS — M7989 Other specified soft tissue disorders: Secondary | ICD-10-CM

## 2024-07-17 ENCOUNTER — Other Ambulatory Visit: Payer: Self-pay

## 2024-07-18 ENCOUNTER — Ambulatory Visit: Admitting: Podiatry

## 2024-07-18 ENCOUNTER — Ambulatory Visit

## 2024-07-18 DIAGNOSIS — M19071 Primary osteoarthritis, right ankle and foot: Secondary | ICD-10-CM

## 2024-07-18 DIAGNOSIS — M65971 Unspecified synovitis and tenosynovitis, right ankle and foot: Secondary | ICD-10-CM

## 2024-07-19 ENCOUNTER — Other Ambulatory Visit: Payer: Self-pay | Admitting: Podiatry

## 2024-07-19 ENCOUNTER — Other Ambulatory Visit: Payer: Self-pay

## 2024-07-19 DIAGNOSIS — M19071 Primary osteoarthritis, right ankle and foot: Secondary | ICD-10-CM | POA: Diagnosis not present

## 2024-07-19 MED ORDER — TRIAMCINOLONE ACETONIDE 40 MG/ML IJ SUSP
20.0000 mg | Freq: Once | INTRAMUSCULAR | Status: AC
  Administered 2024-07-19: 20 mg

## 2024-07-19 NOTE — Progress Notes (Signed)
 Subjective:  Patient ID: Barbara Fletcher, female    DOB: [birth year],  MRN: [id number] HPI Chief Complaint  Patient presents with   Foot Pain    right foot pain its been going on for years PCP wants her to get it checked-Urgent referral from PCP    73 y.o. female presents with the above complaint.   ROS: Discussed etiology pathology conservative surgical therapies.  Past Medical History:  Diagnosis Date   Arthritis    Cervical radiculopathy    Chronic bilateral low back pain without sciatica    Class 2 severe obesity due to excess calories with serious comorbidity and body mass index (BMI) of 35.0 to 35.9 in adult    Coronary artery disease    Diverticulitis    Frequent headaches    Headaches, cluster    Hyperlipidemia    Hypertension    Osteoporosis without current pathological fracture    Palpitations    Squamous cell carcinoma of skin 11/24/2017   SCCIS, right third fingernail - tx with [facility]   Past Surgical History:  Procedure Laterality Date   ABDOMINAL HYSTERECTOMY  08/30/1988   BACK SURGERY     lumbar fussion   CARDIAC CATHETERIZATION     CESAREAN SECTION  1985, 1988   CHOLECYSTECTOMY  08/30/1984   COLOSTOMY     COLOSTOMY CLOSURE     abd diverticulitis with colostomy, reversal 2007- 2008   CORONARY ANGIOPLASTY     CORONARY STENT INTERVENTION N/A 11/19/2021   Procedure: CORONARY STENT INTERVENTION;  Surgeon: Dann Candyce RAMAN, MD;  Location: [facility] INVASIVE CV [facility];  Service: Cardiovascular;  Laterality: N/A;   LAPAROTOMY  02/13/2012   Procedure: EXPLORATORY LAPAROTOMY;  Surgeon: Donnice Bury, MD;  Location: [facility] OR;  Service: General;  Laterality: N/A;  Exploratory Laparotomy with small bowel resection, lysis of adhension and primary ventral hernia repair   LEFT HEART CATH AND CORONARY ANGIOGRAPHY N/A 11/19/2021   Procedure: LEFT HEART CATH AND CORONARY ANGIOGRAPHY;  Surgeon: Dann Candyce RAMAN, MD;  Location: [facility] INVASIVE CV LAB;  Service: Cardiovascular;   Laterality: N/A;   OOPHORECTOMY     1981   TONSILLECTOMY     1958    Current Outpatient Medications:    Accu-Chek Softclix Lancets lancets, use 2 (two) times daily as directed, Disp: 200 each, Rfl: 1   acetaminophen  (TYLENOL ) 500 MG tablet, Take 1,000 mg by mouth every 6 (six) hours as needed (Arthritis)., Disp: , Rfl:    albuterol  (VENTOLIN  HFA) 108 (90 Base) MCG/ACT inhaler, Inhale 2 inhalations into the lungs every 6 (six) hours as needed for Wheezing, Disp: 6.7 g, Rfl: 0   alendronate  (FOSAMAX ) 70 MG tablet, Take 1 tablet (70 mg total) by mouth once a week. Take with a full glass of water on an empty stomach., Disp: 12 tablet, Rfl: 4   amLODipine  (NORVASC ) 5 MG tablet, Take 1 tablet (5 mg total) by mouth daily., Disp: 90 tablet, Rfl: 3   apixaban  (ELIQUIS ) 5 MG TABS tablet, Take 1 tablet (5 mg total) by mouth 2 (two) times daily., Disp: 180 tablet, Rfl: 1   ascorbic acid (VITAMIN C) 500 MG tablet, Take 500 mg by mouth daily. Chewable, Disp: , Rfl:    Blood Glucose Monitoring Suppl (ACCU-CHEK GUIDE) w/Device KIT, as directed, Disp: 1 kit, Rfl: 0   Blood Glucose Monitoring Suppl (BLOOD GLUCOSE MONITOR SYSTEM) w/Device KIT, use as directed, Disp: 1 kit, Rfl: 0   carboxymethylcellulose (REFRESH PLUS) 0.5 % SOLN, Place 1 drop into both eyes  daily as needed (dry eyes)., Disp: , Rfl:    diclofenac Sodium (VOLTAREN) 1 % GEL, Apply 2 g topically daily as needed (pain)., Disp: , Rfl:    doxycycline  (VIBRAMYCIN ) 100 MG capsule, Take 1 capsule (100 mg total) by mouth 2 (two) times daily for 7 days, Disp: 14 capsule, Rfl: 0   famotidine  (PEPCID ) 10 MG tablet, Take 10 mg by mouth daily., Disp: , Rfl:    FIBER ADULT GUMMIES PO, Take 2 tablets by mouth daily., Disp: , Rfl:    fluticasone  (FLONASE ) 50 MCG/ACT nasal spray, Place 1 spray into both nostrils 2 (two) times daily, Disp: 16 g, Rfl: 0   glucose blood (PRECISION QID TEST) test strip, Test 2 (two) times daily as directed, Disp: 200 strip, Rfl: 1    ketoconazole  (NIZORAL ) 2 % cream, as needed. Heat rash, Disp: , Rfl:    ketoconazole  (NIZORAL ) 2 % cream, Apply topically 2 (two) times daily, Disp: 30 g, Rfl: 1   Lidocaine  4 % PTCH, Apply 1 patch topically daily as needed (pain)., Disp: , Rfl:    meloxicam  (MOBIC ) 15 MG tablet, Take 1 tablet (15 mg total) by mouth daily., Disp: 30 tablet, Rfl: 3   metFORMIN  (GLUCOPHAGE -XR) 500 MG 24 hr tablet, Take 1 tablet (500 mg total) by mouth daily with supper., Disp: 30 tablet, Rfl: 11   metoprolol  tartrate (LOPRESSOR ) 50 MG tablet, Take 1 tablet (50 mg total) by mouth 2 (two) times daily., Disp: 180 tablet, Rfl: 0   nitroGLYCERIN  (NITROSTAT ) 0.4 MG SL tablet, Place 1 tablet (0.4 mg total) under the tongue every 5 (five) minutes as needed for chest pain., Disp: 25 tablet, Rfl: 3   Omega-3 Fatty Acids (FISH OIL) 1200 MG CAPS, Take 1,200 mg by mouth in the morning., Disp: , Rfl:    oseltamivir  (TAMIFLU ) 75 MG capsule, Take 1 capsule (75 mg total) by mouth 2 (two) times daily for 5 days., Disp: 10 capsule, Rfl: 0   penicillin  v potassium (VEETID) 500 MG tablet, Take 2 tablets (1,000 mg total) by mouth now, then 1 tablet 4 times a day undtil gone., Disp: 28 tablet, Rfl: 0   pregabalin  (LYRICA ) 75 MG capsule, TAKE 1 CAPSULE BY MOUTH TWO TIMES DAILY, Disp: 60 capsule, Rfl: 5   PRESCRIPTION MEDICATION, Apply 1 application. topically daily as needed (rash). Hydrocortisone 2% equal part with Ketoconazole  cream 2 % (mix together equal parts), Disp: , Rfl:    promethazine -dextromethorphan (PROMETHAZINE -DM) 6.25-15 MG/5ML syrup, Take 5 mLs by mouth every 6 (six) hours as needed for cough., Disp: 120 mL, Rfl: 0   rosuvastatin  (CRESTOR ) 40 MG tablet, Take 1 tablet (40 mg total) by mouth daily., Disp: 90 tablet, Rfl: 2   Vitamin D, Ergocalciferol, 2000 units CAPS, Take 2,000 Units by mouth daily., Disp: , Rfl:   Allergies  Allergen Reactions   Levsin [Hyoscyamine Sulfate] Hives and Shortness Of Breath   Hyoscyamine      Other Reaction(s): Not available   Lisinopril  Swelling    Facial swelling, no breathing issues  Other Reaction(s): Not available   Review of Systems Objective:  There were no vitals filed for this visit.  General: Well developed, nourished, in no acute distress, alert and oriented x3   Dermatological: Skin is warm, dry and supple bilateral. Nails x 10 are well maintained; remaining integument appears unremarkable at this time. There are no open sores, no preulcerative lesions, no rash or signs of infection present.  Vascular: Dorsalis Pedis artery and Posterior Tibial artery  pedal pulses are 2/4 bilateral with immedate capillary fill time. Pedal hair growth present. No varicosities and no lower extremity edema present bilateral.   Neruologic: Grossly intact via light touch bilateral. Vibratory intact via tuning fork bilateral. Protective threshold with Semmes Wienstein monofilament intact to all pedal sites bilateral. Patellar and Achilles deep tendon reflexes 2+ bilateral. No Babinski or clonus noted bilateral.   Musculoskeletal: No gross boney pedal deformities bilateral. No pain, crepitus, or limitation noted with foot and ankle range of motion bilateral. Muscular strength 5/5 in all groups tested bilateral.  Pain on palpation dorsal midfoot right.  She has pain with frontal plane range of motion.  Gait: Unassisted, Nonantalgic.    Radiographs:  Radiographs taken today demonstrate osseously mature individual right foot with severe osteoarthritic changes of the tarsometatarsal joints 2 through 4 on the right foot.  Mild demineralization of bone.  Assessment & Plan:   Assessment: Osteoarthritis right foot.  Plan: Discussed etiology pathology conservative versus surgical therapies discussed appropriate shoe gear and lacing.  Also discussed injection therapy and surgical therapy.  At this point we will start out with injection therapy.  I injected across the dorsal aspect of the right  foot overlying the tarsometatarsal joints with 20 mg Kenalog , Vance Marcaine  for maximal tenderness.  Tolerated procedure well without complications elected follow-up with her on an as-needed basis.  We did discuss midfoot fusion with Dr. Silva.     Kourtlyn Charlet T. [place], [place]

## 2024-07-20 ENCOUNTER — Other Ambulatory Visit: Payer: Self-pay

## 2024-07-20 MED FILL — Pregabalin Cap 75 MG: ORAL | 30 days supply | Qty: 60 | Fill #0 | Status: AC

## 2024-07-22 ENCOUNTER — Other Ambulatory Visit: Payer: Self-pay

## 2024-07-23 ENCOUNTER — Other Ambulatory Visit: Payer: Self-pay

## 2024-07-23 MED ORDER — ONETOUCH DELICA PLUS LANCET33G MISC
1 refills | Status: AC
  Filled 2024-07-23: qty 100, 100d supply, fill #0

## 2024-07-23 MED ORDER — ONETOUCH ULTRA TEST VI STRP
ORAL_STRIP | 1 refills | Status: AC
  Filled 2024-07-23: qty 100, 100d supply, fill #0

## 2024-07-23 MED ORDER — ONETOUCH ULTRA 2 W/DEVICE KIT
PACK | 0 refills | Status: AC
  Filled 2024-07-23: qty 1, 90d supply, fill #0

## 2024-07-24 ENCOUNTER — Other Ambulatory Visit: Payer: Self-pay

## 2024-08-10 ENCOUNTER — Other Ambulatory Visit: Payer: Self-pay

## 2024-08-19 MED FILL — Pregabalin Cap 75 MG: ORAL | 30 days supply | Qty: 60 | Fill #1 | Status: CN

## 2024-08-20 ENCOUNTER — Other Ambulatory Visit: Payer: Self-pay

## 2024-08-21 MED FILL — Pregabalin Cap 75 MG: ORAL | 30 days supply | Qty: 60 | Fill #1 | Status: AC

## 2024-08-24 ENCOUNTER — Other Ambulatory Visit: Payer: Self-pay | Admitting: Cardiology

## 2024-08-24 ENCOUNTER — Other Ambulatory Visit: Payer: Self-pay

## 2024-08-31 ENCOUNTER — Other Ambulatory Visit: Payer: Self-pay

## 2024-08-31 ENCOUNTER — Other Ambulatory Visit: Payer: Self-pay | Admitting: Cardiology

## 2024-08-31 DIAGNOSIS — I10 Essential (primary) hypertension: Secondary | ICD-10-CM

## 2024-08-31 MED ORDER — METOPROLOL TARTRATE 50 MG PO TABS
50.0000 mg | ORAL_TABLET | Freq: Two times a day (BID) | ORAL | 0 refills | Status: DC
  Filled 2024-08-31: qty 60, 30d supply, fill #0

## 2024-09-06 ENCOUNTER — Encounter: Payer: Self-pay | Admitting: Cardiology

## 2024-09-17 ENCOUNTER — Other Ambulatory Visit: Payer: Self-pay | Admitting: Cardiology

## 2024-09-19 ENCOUNTER — Other Ambulatory Visit: Payer: Self-pay

## 2024-09-21 ENCOUNTER — Other Ambulatory Visit: Payer: Self-pay

## 2024-09-21 ENCOUNTER — Other Ambulatory Visit: Payer: Self-pay | Admitting: Cardiovascular Disease

## 2024-09-22 ENCOUNTER — Other Ambulatory Visit: Payer: Self-pay

## 2024-09-22 ENCOUNTER — Other Ambulatory Visit: Payer: Self-pay | Admitting: Cardiology

## 2024-09-24 ENCOUNTER — Other Ambulatory Visit: Payer: Self-pay

## 2024-09-24 MED FILL — Rosuvastatin Calcium Tab 40 MG: ORAL | 90 days supply | Qty: 90 | Fill #0 | Status: AC

## 2024-09-25 ENCOUNTER — Other Ambulatory Visit: Payer: Self-pay | Admitting: Cardiology

## 2024-09-25 ENCOUNTER — Other Ambulatory Visit: Payer: Self-pay

## 2024-09-25 DIAGNOSIS — I10 Essential (primary) hypertension: Secondary | ICD-10-CM

## 2024-09-28 ENCOUNTER — Other Ambulatory Visit: Payer: Self-pay

## 2024-09-28 MED ORDER — METOPROLOL TARTRATE 50 MG PO TABS
50.0000 mg | ORAL_TABLET | Freq: Two times a day (BID) | ORAL | 0 refills | Status: AC
  Filled 2024-09-28: qty 120, 60d supply, fill #0

## 2024-10-03 ENCOUNTER — Other Ambulatory Visit: Payer: Self-pay

## 2024-10-31 ENCOUNTER — Ambulatory Visit: Admitting: Cardiology
